# Patient Record
Sex: Female | Born: 1940 | Race: Black or African American | Hispanic: No | Marital: Single | Smoking: Former smoker
Health system: Southern US, Community
[De-identification: ages and names within clinical notes are randomized; demographics above are authoritative.]

## PROBLEM LIST (undated history)

## (undated) DIAGNOSIS — I2510 Atherosclerotic heart disease of native coronary artery without angina pectoris: Secondary | ICD-10-CM

## (undated) DIAGNOSIS — E049 Nontoxic goiter, unspecified: Secondary | ICD-10-CM

## (undated) DIAGNOSIS — E7800 Pure hypercholesterolemia, unspecified: Secondary | ICD-10-CM

## (undated) DIAGNOSIS — J302 Other seasonal allergic rhinitis: Secondary | ICD-10-CM

## (undated) DIAGNOSIS — I219 Acute myocardial infarction, unspecified: Secondary | ICD-10-CM

## (undated) DIAGNOSIS — Z972 Presence of dental prosthetic device (complete) (partial): Secondary | ICD-10-CM

## (undated) DIAGNOSIS — M1990 Unspecified osteoarthritis, unspecified site: Secondary | ICD-10-CM

## (undated) DIAGNOSIS — I10 Essential (primary) hypertension: Secondary | ICD-10-CM

## (undated) DIAGNOSIS — E119 Type 2 diabetes mellitus without complications: Secondary | ICD-10-CM

## (undated) HISTORY — PX: EYE SURGERY: SHX253

---

## 1980-02-10 HISTORY — PX: VAGINAL HYSTERECTOMY: SUR661

## 2003-02-10 DIAGNOSIS — I219 Acute myocardial infarction, unspecified: Secondary | ICD-10-CM

## 2003-02-10 HISTORY — DX: Acute myocardial infarction, unspecified: I21.9

## 2003-02-10 HISTORY — PX: CARDIAC CATHETERIZATION: SHX172

## 2004-01-22 ENCOUNTER — Encounter: Payer: Self-pay | Admitting: Family Medicine

## 2004-02-10 ENCOUNTER — Encounter: Payer: Self-pay | Admitting: Family Medicine

## 2004-03-12 ENCOUNTER — Encounter: Payer: Self-pay | Admitting: Family Medicine

## 2004-04-09 ENCOUNTER — Encounter: Payer: Self-pay | Admitting: Family Medicine

## 2004-09-24 ENCOUNTER — Ambulatory Visit: Payer: Self-pay | Admitting: Family Medicine

## 2005-02-09 LAB — HM COLONOSCOPY: HM Colonoscopy: NORMAL

## 2005-05-04 ENCOUNTER — Ambulatory Visit: Payer: Self-pay | Admitting: Internal Medicine

## 2005-10-06 ENCOUNTER — Ambulatory Visit: Payer: Self-pay | Admitting: Family Medicine

## 2006-02-05 ENCOUNTER — Ambulatory Visit: Payer: Self-pay | Admitting: Gastroenterology

## 2006-02-05 ENCOUNTER — Other Ambulatory Visit: Payer: Self-pay

## 2006-02-05 LAB — HM COLONOSCOPY: HM Colonoscopy: NORMAL

## 2006-10-19 ENCOUNTER — Ambulatory Visit: Payer: Self-pay | Admitting: Family Medicine

## 2006-10-20 ENCOUNTER — Ambulatory Visit: Payer: Self-pay | Admitting: Internal Medicine

## 2007-03-15 ENCOUNTER — Ambulatory Visit: Payer: Self-pay | Admitting: Family Medicine

## 2007-10-20 ENCOUNTER — Ambulatory Visit: Payer: Self-pay | Admitting: Family Medicine

## 2008-01-17 ENCOUNTER — Ambulatory Visit: Payer: Self-pay | Admitting: Family Medicine

## 2008-10-22 ENCOUNTER — Ambulatory Visit: Payer: Self-pay | Admitting: Family Medicine

## 2009-11-13 ENCOUNTER — Ambulatory Visit: Payer: Self-pay | Admitting: Family Medicine

## 2010-03-11 ENCOUNTER — Ambulatory Visit: Payer: Self-pay | Admitting: Family Medicine

## 2010-12-09 ENCOUNTER — Ambulatory Visit: Payer: Self-pay | Admitting: Family Medicine

## 2011-11-12 LAB — HM PAP SMEAR: HM Pap smear: NORMAL

## 2011-12-10 ENCOUNTER — Ambulatory Visit: Payer: Self-pay | Admitting: Family Medicine

## 2011-12-15 ENCOUNTER — Ambulatory Visit: Payer: Self-pay | Admitting: Family Medicine

## 2012-05-10 LAB — HM DEXA SCAN: HM Dexa Scan: NORMAL

## 2012-05-17 ENCOUNTER — Ambulatory Visit: Payer: Self-pay | Admitting: Family Medicine

## 2012-12-15 ENCOUNTER — Ambulatory Visit: Payer: Self-pay | Admitting: Family Medicine

## 2013-01-11 ENCOUNTER — Ambulatory Visit: Payer: Self-pay | Admitting: Family Medicine

## 2013-02-09 LAB — TSH: TSH: 0.42 u[IU]/mL (ref <=5.90)

## 2013-10-23 ENCOUNTER — Ambulatory Visit: Payer: Self-pay | Admitting: Ophthalmology

## 2013-12-12 LAB — LIPID PANEL
Cholesterol: 180 mg/dL (ref 0–200)
HDL: 87 mg/dL — AB (ref 35–70)
LDL Cholesterol: 83 mg/dL
TRIGLYCERIDES: 48 mg/dL (ref 40–160)

## 2014-01-12 ENCOUNTER — Ambulatory Visit: Payer: Self-pay | Admitting: Family Medicine

## 2014-01-12 LAB — HM MAMMOGRAPHY: HM MAMMO: NORMAL

## 2014-01-23 LAB — HEMOGLOBIN A1C: HEMOGLOBIN A1C: 6.7 % — AB (ref 4.0–6.0)

## 2014-01-23 LAB — BASIC METABOLIC PANEL
Creatinine: 0.8 mg/dL (ref <=1.1)
Glucose: 94 mg/dL
POTASSIUM: 3.9 mmol/L (ref 3.4–5.3)

## 2014-06-02 NOTE — Op Note (Signed)
PATIENT NAME:  Vanessa, Zamora MR#:  [id number] DATE OF BIRTH:  01/30/1941  DATE OF PROCEDURE:  10/23/2013  DATE OF SURGERY:  10/23/2013  PREOPERATIVE DIAGNOSIS:  Cataract of the left eye.  POSTOPERATIVE DIAGNOSIS:  Cataract of the left eye.  SURGEON:  Grayland Ormond, MD  PROCEDURE:  Cataract extraction by phacoemulsification with intraocular lens implantation left eye.  ANESTHESIA:  Monitored anesthesia care.  COMPLICATIONS:  None.  HISTORY:  The patient presented to the clinic with complaints of decreased vision in the left eye.  Given disability from glare as well as blur, the patient was presented to the option of cataract surgery, and the risks of the operation were discussed with the patient as well.  After full discussion, the patient expressed an understanding of these risks and now is to undergo the procedure.  SUMMARY OF PROCEDURE:  On the day of surgery, the patient was brought to the preoperative holding area. The left eye was identified and left cheek marked.  The patient was then brought to the operating room where she was connected to an EKG and pulse oximeter and monitored anesthesia care was subsequently initiated.  The right eye was prepped and draped in the usual sterile ophthalmic fashion, and a speculum was placed into the right eye.  Paracentesis was then made into the anterior chamber followed by preservative free lidocaine and Viscoat were instilled into the anterior chamber, thereby filling the anterior chamber of the left eye.  Attention was then directed to the temporal limbus where a bi-planer incision using a microkeratome was then made. A bent needle was used to create a curvilinear capsulorrhexis which was then completed with microforceps in a 360 degree fashion.  Balanced salt solution in a flat cannula was instilled to perform a thorough hydrodissection until the nucleus was noted to spin freely. Primary phacoemulsification emulsification was then initiated in  a divide and conquer format to create 4 grooves and then to subsequently crack the nucleus.  The individual nuclear pieces were phacoemulsified using a secondary phacoemulsification technique.  The remaining cortical material then was removed with an irrigation aspiration handpiece.  Healon was then injected into the capsular bag in the surgical space and an AcrySof IOL was loaded into the injector and injected into the capsular bag.  The remaining Healon was removed with irrigation and aspiration.  The corneal wounds were then strongly hydrated and found to be watertight.  After this, about 1 mL of cefuroxime was instilled into the anterior chamber and the speculum was removed.  One drop of brimonidine and one drop of timolol was applied to the operated eye and eye shields were placed.  The patient was then taken to the postoperative recovery area.  Instructions were given to avoid heavy lifting, eye rubbing, as well as bending over.  The patient will be returning to me in the next day in the eye clinic for a postoperative check.  The patient stated an understanding of these instructions.     ____________________________ Grayland Ormond, MD apv:nr D: 10/26/2013 21:45:00 ET T: 10/26/2013 21:55:18 ET JOB#: [id number]  cc: Grayland Ormond, MD, <Dictator> Xylina Rhoads P VIN [facility] MD ELECTRONICALLY SIGNED 11/01/2013 13:44

## 2014-06-07 DIAGNOSIS — Z1331 Encounter for screening for depression: Secondary | ICD-10-CM | POA: Insufficient documentation

## 2014-06-07 DIAGNOSIS — E0590 Thyrotoxicosis, unspecified without thyrotoxic crisis or storm: Secondary | ICD-10-CM | POA: Insufficient documentation

## 2014-06-07 DIAGNOSIS — E785 Hyperlipidemia, unspecified: Secondary | ICD-10-CM | POA: Insufficient documentation

## 2014-06-07 DIAGNOSIS — E119 Type 2 diabetes mellitus without complications: Secondary | ICD-10-CM | POA: Insufficient documentation

## 2014-06-07 DIAGNOSIS — E7800 Pure hypercholesterolemia, unspecified: Secondary | ICD-10-CM | POA: Insufficient documentation

## 2014-06-07 DIAGNOSIS — I10 Essential (primary) hypertension: Secondary | ICD-10-CM | POA: Insufficient documentation

## 2014-06-07 DIAGNOSIS — Z0000 Encounter for general adult medical examination without abnormal findings: Secondary | ICD-10-CM | POA: Insufficient documentation

## 2014-08-16 ENCOUNTER — Other Ambulatory Visit: Payer: Self-pay | Admitting: Family Medicine

## 2014-08-16 DIAGNOSIS — I10 Essential (primary) hypertension: Secondary | ICD-10-CM

## 2014-08-17 ENCOUNTER — Other Ambulatory Visit: Payer: Self-pay | Admitting: Family Medicine

## 2014-08-17 DIAGNOSIS — I10 Essential (primary) hypertension: Secondary | ICD-10-CM

## 2014-10-11 HISTORY — PX: CATARACT EXTRACTION: SUR2

## 2014-10-16 ENCOUNTER — Other Ambulatory Visit: Payer: Self-pay | Admitting: Family Medicine

## 2014-10-16 DIAGNOSIS — I10 Essential (primary) hypertension: Secondary | ICD-10-CM

## 2014-11-13 ENCOUNTER — Encounter: Payer: Self-pay | Admitting: *Deleted

## 2014-11-16 NOTE — Discharge Instructions (Signed)

## 2014-11-19 ENCOUNTER — Encounter
Admission: RE | Disposition: A | Discharge status: Home / Self Care | Payer: Self-pay | Source: Ambulatory Visit | Attending: Ophthalmology

## 2014-11-19 ENCOUNTER — Ambulatory Visit: Payer: Medicare Other | Admitting: Anesthesiology

## 2014-11-19 ENCOUNTER — Ambulatory Visit
Admission: RE | Admit: 2014-11-19 | Discharge: 2014-11-19 | Disposition: A | Discharge status: Home / Self Care | Payer: Medicare Other | Source: Ambulatory Visit | Attending: Ophthalmology | Admitting: Ophthalmology

## 2014-11-19 DIAGNOSIS — E7800 Pure hypercholesterolemia, unspecified: Secondary | ICD-10-CM | POA: Diagnosis not present

## 2014-11-19 DIAGNOSIS — I2510 Atherosclerotic heart disease of native coronary artery without angina pectoris: Secondary | ICD-10-CM | POA: Diagnosis not present

## 2014-11-19 DIAGNOSIS — I10 Essential (primary) hypertension: Secondary | ICD-10-CM | POA: Diagnosis not present

## 2014-11-19 DIAGNOSIS — Z888 Allergy status to other drugs, medicaments and biological substances status: Secondary | ICD-10-CM | POA: Insufficient documentation

## 2014-11-19 DIAGNOSIS — E119 Type 2 diabetes mellitus without complications: Secondary | ICD-10-CM | POA: Insufficient documentation

## 2014-11-19 DIAGNOSIS — Z90710 Acquired absence of both cervix and uterus: Secondary | ICD-10-CM | POA: Insufficient documentation

## 2014-11-19 DIAGNOSIS — Z87891 Personal history of nicotine dependence: Secondary | ICD-10-CM | POA: Insufficient documentation

## 2014-11-19 DIAGNOSIS — H2511 Age-related nuclear cataract, right eye: Secondary | ICD-10-CM | POA: Insufficient documentation

## 2014-11-19 HISTORY — DX: Acute myocardial infarction, unspecified: I21.9

## 2014-11-19 HISTORY — DX: Pure hypercholesterolemia, unspecified: E78.00

## 2014-11-19 HISTORY — DX: Essential (primary) hypertension: I10

## 2014-11-19 HISTORY — DX: Other seasonal allergic rhinitis: J30.2

## 2014-11-19 HISTORY — DX: Atherosclerotic heart disease of native coronary artery without angina pectoris: I25.10

## 2014-11-19 HISTORY — DX: Unspecified osteoarthritis, unspecified site: M19.90

## 2014-11-19 HISTORY — PX: CATARACT EXTRACTION W/PHACO: SHX586

## 2014-11-19 HISTORY — DX: Nontoxic goiter, unspecified: E04.9

## 2014-11-19 HISTORY — DX: Presence of dental prosthetic device (complete) (partial): Z97.2

## 2014-11-19 HISTORY — DX: Type 2 diabetes mellitus without complications: E11.9

## 2014-11-19 LAB — GLUCOSE, CAPILLARY
GLUCOSE-CAPILLARY: 94 mg/dL (ref 65–99)
GLUCOSE-CAPILLARY: 99 mg/dL (ref 65–99)

## 2014-11-19 SURGERY — PHACOEMULSIFICATION, CATARACT, WITH IOL INSERTION
Anesthesia: Monitor Anesthesia Care | Laterality: Right | Wound class: Clean

## 2014-11-19 MED ORDER — TIMOLOL MALEATE 0.5 % OP SOLN
OPHTHALMIC | Status: DC | PRN
  Administered 2014-11-19: 1 [drp] via OPHTHALMIC

## 2014-11-19 MED ORDER — TETRACAINE HCL 0.5 % OP SOLN
1.0000 [drp] | OPHTHALMIC | Status: DC | PRN
  Administered 2014-11-19: 1 [drp] via OPHTHALMIC

## 2014-11-19 MED ORDER — BRIMONIDINE TARTRATE 0.2 % OP SOLN
OPHTHALMIC | Status: DC | PRN
  Administered 2014-11-19: 1 [drp] via OPHTHALMIC

## 2014-11-19 MED ORDER — POVIDONE-IODINE 5 % OP SOLN
1.0000 "application " | OPHTHALMIC | Status: DC | PRN
  Administered 2014-11-19: 1 via OPHTHALMIC

## 2014-11-19 MED ORDER — LIDOCAINE HCL (PF) 4 % IJ SOLN
INTRAOCULAR | Status: DC | PRN
  Administered 2014-11-19: 1 mL via OPHTHALMIC

## 2014-11-19 MED ORDER — LACTATED RINGERS IV SOLN: INTRAVENOUS | Status: DC

## 2014-11-19 MED ORDER — ARMC OPHTHALMIC DILATING GEL
1.0000 "application " | OPHTHALMIC | Status: DC | PRN
  Administered 2014-11-19 (×2): 1 via OPHTHALMIC

## 2014-11-19 MED ORDER — CEFUROXIME OPHTHALMIC INJECTION 1 MG/0.1 ML
INJECTION | OPHTHALMIC | Status: DC | PRN
  Administered 2014-11-19: 0.1 mL via INTRACAMERAL

## 2014-11-19 MED ORDER — MIDAZOLAM HCL 2 MG/2ML IJ SOLN
INTRAMUSCULAR | Status: DC | PRN
  Administered 2014-11-19: 1.5 mg via INTRAVENOUS

## 2014-11-19 MED ORDER — FENTANYL CITRATE (PF) 100 MCG/2ML IJ SOLN
INTRAMUSCULAR | Status: DC | PRN
  Administered 2014-11-19: 50 ug via INTRAVENOUS

## 2014-11-19 MED ORDER — NA HYALUR & NA CHOND-NA HYALUR 0.4-0.35 ML IO KIT
PACK | INTRAOCULAR | Status: DC | PRN
  Administered 2014-11-19: 1 mL via INTRAOCULAR

## 2014-11-19 MED ORDER — EPINEPHRINE HCL 1 MG/ML IJ SOLN
INTRAOCULAR | Status: DC | PRN
  Administered 2014-11-19: 95 mL via OPHTHALMIC

## 2014-11-19 SURGICAL SUPPLY — 29 items
APPLICATOR COTTON TIP 3IN (MISCELLANEOUS) ×2 IMPLANT
CANNULA ANT/CHMB 27GA (MISCELLANEOUS) ×2 IMPLANT
DISSECTOR HYDRO NUCLEUS 50X22 (MISCELLANEOUS) ×2 IMPLANT
GLOVE BIO SURGEON STRL SZ7 (GLOVE) ×2 IMPLANT
GLOVE SURG LX 6.5 MICRO (GLOVE) ×1
GLOVE SURG LX STRL 6.5 MICRO (GLOVE) ×1 IMPLANT
GOWN STRL REUS W/ TWL LRG LVL3 (GOWN DISPOSABLE) ×2 IMPLANT
GOWN STRL REUS W/TWL LRG LVL3 (GOWN DISPOSABLE) ×2
LENS IOL ACRSF IQ PC 22.5 (Intraocular Lens) ×1 IMPLANT
LENS IOL ACRYSOF IQ POST 22.5 (Intraocular Lens) ×2 IMPLANT
MARKER SKIN SURG W/RULER VIO (MISCELLANEOUS) ×2 IMPLANT
NEEDLE FILTER BLUNT 18X 1/2SAF (NEEDLE) ×2
NEEDLE FILTER BLUNT 18X1 1/2 (NEEDLE) ×2 IMPLANT
PACK CATARACT BRASINGTON (MISCELLANEOUS) ×2 IMPLANT
PACK EYE AFTER SURG (MISCELLANEOUS) ×2 IMPLANT
PACK OPTHALMIC (MISCELLANEOUS) ×2 IMPLANT
RING MALYGIN 7.0 (MISCELLANEOUS) IMPLANT
SOL BAL SALT 15ML (MISCELLANEOUS)
SOLUTION BAL SALT 15ML (MISCELLANEOUS) IMPLANT
SUT ETHILON 10-0 CS-B-6CS-B-6 (SUTURE)
SUT VICRYL  9 0 (SUTURE)
SUT VICRYL 9 0 (SUTURE) IMPLANT
SUTURE EHLN 10-0 CS-B-6CS-B-6 (SUTURE) IMPLANT
SYR 3ML LL SCALE MARK (SYRINGE) ×4 IMPLANT
SYR TB 1ML LUER SLIP (SYRINGE) ×2 IMPLANT
WATER STERILE IRR 250ML POUR (IV SOLUTION) ×2 IMPLANT
WATER STERILE IRR 500ML POUR (IV SOLUTION) IMPLANT
WICK EYE OCUCEL (MISCELLANEOUS) IMPLANT
WIPE NON LINTING 3.25X3.25 (MISCELLANEOUS) ×2 IMPLANT

## 2014-11-19 NOTE — H&P (Signed)
H+P reviewed and is up to date, please see paper chart.  

## 2014-11-19 NOTE — Transfer of Care (Signed)
Immediate Anesthesia Transfer of Care Note  Patient: Vanessa Zamora  Procedure(s) Performed: Procedure(s) with comments: CATARACT EXTRACTION PHACO AND INTRAOCULAR LENS PLACEMENT (IOC) (Right) - DIABETIC - oral meds  Patient Location: PACU  Anesthesia Type: MAC  Level of Consciousness: awake, alert  and patient cooperative  Airway and Oxygen Therapy: Patient Spontanous Breathing and Patient connected to supplemental oxygen  Post-op Assessment: Post-op Vital signs reviewed, Patient's Cardiovascular Status Stable, Respiratory Function Stable, Patent Airway and No signs of Nausea or vomiting  Post-op Vital Signs: Reviewed and stable  Complications: No apparent anesthesia complications

## 2014-11-19 NOTE — Op Note (Signed)
Date of Surgery: 11/19/2014  PREOPERATIVE DIAGNOSES: Visually significant nuclear sclerotic cataract, right eye.  POSTOPERATIVE DIAGNOSES: Same  PROCEDURES PERFORMED: Cataract extraction with intraocular lens implant, right eye.  SURGEON: Almon Hercules, M.D.  ANESTHESIA: MAC and topical  IMPLANTS: AcrySof IQ [id number] +22.5   Implant Name Type Inv. Item Serial No. Manufacturer Lot No. LRB No. Used  IMPLANT LENS - [id number] Intraocular Lens IMPLANT LENS [id number] ALCON   Right 1     COMPLICATIONS: None.  DESCRIPTION OF PROCEDURE: Therapeutic options were discussed with the patient preoperatively, including a discussion of risks and benefits of surgery. Informed consent was obtained. An IOL-Master and immersion biometry were used to take the lens measurements, and a dilated fundus exam was performed within 6 months of the surgical date.  The patient was premedicated and brought to the operating room and placed on the operating table in the supine position. After adequate anesthesia, the patient was prepped and draped in the usual sterile ophthalmic fashion. A wire lid speculum was inserted and the microscope was positioned. A Superblade was used to create a paracentesis site at the limbus and a small amount of dilute preservative free lidocaine was instilled into the anterior chamber, followed by dispersive viscoelastic. A clear corneal incision was created temporally using a 2.4 mm keratome blade. Capsulorrhexis was then performed. In situ phacoemulsification was performed.  Cortical material was removed with the irrigation-aspiration unit. Dispersive viscoelastic was instilled to open the capsular bag. A posterior chamber intraocular lens with the specifications above was inserted and positioned. Irrigation-aspiration was used to remove all viscoelastic. Cefuroxime 1cc was instilled into the anterior chamber, and the corneal incision was checked and found to be water tight. The eyelid  speculum was removed.  The operative eye was covered with protective goggles after instilling 1 drop of timolol and brimonidine. The patient tolerated the procedure well. There were no complications.

## 2014-11-19 NOTE — Anesthesia Postprocedure Evaluation (Signed)
  Anesthesia Post-op Note  Patient: Vanessa Zamora  Procedure(s) Performed: Procedure(s) with comments: CATARACT EXTRACTION PHACO AND INTRAOCULAR LENS PLACEMENT (IOC) (Right) - DIABETIC - oral meds  Anesthesia type:MAC  Patient location: PACU  Post pain: Pain level controlled  Post assessment: Post-op Vital signs reviewed, Patient's Cardiovascular Status Stable, Respiratory Function Stable, Patent Airway and No signs of Nausea or vomiting  Post vital signs: Reviewed and stable  Last Vitals:  Filed Vitals:   11/19/14 0924  BP: 132/79  Pulse: 52  Temp:   Resp: 16    Level of consciousness: awake, alert  and patient cooperative  Complications: No apparent anesthesia complications

## 2014-11-19 NOTE — Anesthesia Procedure Notes (Signed)
Procedure Name: MAC Performed by: Kendyl Festa Pre-anesthesia Checklist: Patient identified, Emergency Drugs available, Suction available, Timeout performed and Patient being monitored Patient Re-evaluated:Patient Re-evaluated prior to inductionOxygen Delivery Method: Nasal cannula Placement Confirmation: positive ETCO2       

## 2014-11-19 NOTE — Anesthesia Preprocedure Evaluation (Signed)
Anesthesia Evaluation  Patient identified by MRN, date of birth, ID band  Reviewed: NPO status   History of Anesthesia Complications Negative for: history of anesthetic complications  Airway Mallampati: II  TM Distance: >3 FB Neck ROM: full    Dental  (+) Partial Upper, Partial Lower   Pulmonary neg pulmonary ROS, former smoker,    Pulmonary exam normal        Cardiovascular Exercise Tolerance: Good hypertension, + CAD and + Past MI (2005)  Normal cardiovascular exam  Cardiomyopathy    Neuro/Psych negative neurological ROS  negative psych ROS   GI/Hepatic negative GI ROS, Neg liver ROS,   Endo/Other  negative endocrine ROSdiabetesThyroid goiter  Renal/GU negative Renal ROS  negative genitourinary   Musculoskeletal   Abdominal   Peds  Hematology negative hematology ROS (+)   Anesthesia Other Findings stress echo: nov 2015: LVEF= 45-50 %; Regional wall motion:  demonstrates  hypokinesis of the Anterior apical walls. The overall quality of the study is good.  Artifacts noted: noLeft ventricular cavity: enlarged. Perfusion Analysis:  SPECT images demonstrate large perfusion abnormality of moderate intensity is present in the  anterior apical  region on the stress images.   No reversible defect.  echo: nov 3845: MILD LV SYSTOLIC DYSFUNCTION; NO VALVULAR STENOSIS; MILD VALVULAR REGURGITATION; Mild PHTN; Normal overall left ventricular function ejection fraction between 50 and 55%;  card stable: 07/2014: history of myocardial infarction. She only has moderate coronary disease has been treated medically ; coronary artery disease stable persistent continue current medical therapy including aspirin;    Reproductive/Obstetrics                             Anesthesia Physical Anesthesia Plan  ASA: III  Anesthesia Plan: MAC   Post-op Pain Management:    Induction:   Airway Management  Planned:   Additional Equipment:   Intra-op Plan:   Post-operative Plan:   Informed Consent: I have reviewed the patients History and Physical, chart, labs and discussed the procedure including the risks, benefits and alternatives for the proposed anesthesia with the patient or authorized representative who has indicated his/her understanding and acceptance.     Plan Discussed with: CRNA  Anesthesia Plan Comments:         Anesthesia Quick Evaluation

## 2014-11-20 ENCOUNTER — Encounter: Payer: Self-pay | Admitting: Ophthalmology

## 2014-12-06 ENCOUNTER — Ambulatory Visit (INDEPENDENT_AMBULATORY_CARE_PROVIDER_SITE_OTHER): Payer: Medicare Other | Admitting: Family Medicine

## 2014-12-06 ENCOUNTER — Encounter: Payer: Self-pay | Admitting: Family Medicine

## 2014-12-06 VITALS — BP 110/62 | HR 60 | Ht 65.0 in | Wt 135.0 lb

## 2014-12-06 DIAGNOSIS — Z23 Encounter for immunization: Secondary | ICD-10-CM

## 2014-12-06 DIAGNOSIS — E119 Type 2 diabetes mellitus without complications: Secondary | ICD-10-CM | POA: Diagnosis not present

## 2014-12-06 DIAGNOSIS — E785 Hyperlipidemia, unspecified: Secondary | ICD-10-CM

## 2014-12-06 DIAGNOSIS — I10 Essential (primary) hypertension: Secondary | ICD-10-CM

## 2014-12-06 DIAGNOSIS — H6121 Impacted cerumen, right ear: Secondary | ICD-10-CM

## 2014-12-06 MED ORDER — HYDROCHLOROTHIAZIDE 25 MG PO TABS
25.0000 mg | ORAL_TABLET | Freq: Every day | ORAL | Status: DC
Start: 2014-12-06 — End: 2015-04-09

## 2014-12-06 MED ORDER — PRAVASTATIN SODIUM 40 MG PO TABS: 40.0000 mg | ORAL_TABLET | Freq: Every day | ORAL | Status: DC

## 2014-12-06 MED ORDER — CARVEDILOL 6.25 MG PO TABS: 6.2500 mg | ORAL_TABLET | Freq: Two times a day (BID) | ORAL | Status: DC

## 2014-12-06 MED ORDER — OLMESARTAN MEDOXOMIL 40 MG PO TABS: 40.0000 mg | ORAL_TABLET | Freq: Every day | ORAL | Status: DC

## 2014-12-06 MED ORDER — METFORMIN HCL 500 MG PO TABS: 500.0000 mg | ORAL_TABLET | Freq: Two times a day (BID) | ORAL | Status: DC

## 2014-12-06 NOTE — Progress Notes (Signed)
Name: Vanessa Zamora   MRN: [id number]    DOB: December 23, 1940   Date:12/06/2014       Progress Note  Subjective  Chief Complaint  Chief Complaint  Patient presents with  . Diabetes  . Hypertension  . Hyperlipidemia    Diabetes She presents for her follow-up diabetic visit. She has type 2 diabetes mellitus. Her disease course has been stable. Pertinent negatives for hypoglycemia include no confusion, dizziness, headaches, hunger, mood changes, nervousness/anxiousness, pallor, seizures, sleepiness, speech difficulty, sweats or tremors. Pertinent negatives for diabetes include no blurred vision, no chest pain, no fatigue, no foot paresthesias, no foot ulcerations, no polydipsia, no polyphagia, no polyuria, no visual change, no weakness and no weight loss. There are no hypoglycemic complications. Pertinent negatives for hypoglycemia complications include no blackouts and no nocturnal hypoglycemia. Symptoms are stable. There are no diabetic complications. Pertinent negatives for diabetic complications include no CVA, PVD or retinopathy. Risk factors for coronary artery disease include dyslipidemia, diabetes mellitus and hypertension. Current diabetic treatment includes oral agent (monotherapy). She is compliant with treatment all of the time. She is following a generally healthy diet. She has not had a previous visit with a dietitian. Her breakfast blood glucose is taken between 8-9 am. Her breakfast blood glucose range is generally 110-130 mg/dl. An ACE inhibitor/angiotensin II receptor blocker is being taken. She sees a podiatrist.Eye exam is current.  Hypertension This is a chronic problem. The current episode started more than 1 year ago. The problem has been waxing and waning since onset. The problem is controlled. Pertinent negatives include no anxiety, blurred vision, chest pain, headaches, malaise/fatigue, neck pain, orthopnea, palpitations, peripheral edema, PND, shortness of breath or sweats.  There are no associated agents to hypertension. There are no known risk factors for coronary artery disease. Past treatments include angiotensin blockers, diuretics and beta blockers. The current treatment provides mild improvement. There are no compliance problems.  There is no history of angina, kidney disease, CAD/MI, CVA, heart failure, left ventricular hypertrophy, PVD, renovascular disease or retinopathy. There is no history of chronic renal disease.  Hyperlipidemia This is a chronic problem. The current episode started more than 1 year ago. The problem is controlled. Recent lipid tests were reviewed and are normal. She has no history of chronic renal disease, diabetes, hypothyroidism, liver disease, obesity or nephrotic syndrome. There are no known factors aggravating her hyperlipidemia. Pertinent negatives include no chest pain, focal weakness, myalgias or shortness of breath. Current antihyperlipidemic treatment includes statins (otc omea 3). The current treatment provides moderate improvement of lipids. There are no compliance problems.     No problem-specific assessment & plan notes found for this encounter.   Past Medical History  Diagnosis Date  . Myocardial infarction ([place]) 2005  . Hypertension   . Coronary artery disease   . Diabetes mellitus without complication ([place])   . Hypercholesteremia   . Seasonal allergies   . Thyroid goiter   . Arthritis     shoulders, knees  . Wears dentures     partial lower    Past Surgical History  Procedure Laterality Date  . Vaginal hysterectomy  02/10/1980  . Cardiac catheterization  2005  . Cataract extraction Left 10/2014  . Cataract extraction w/phaco Right 11/19/2014    Procedure: CATARACT EXTRACTION PHACO AND INTRAOCULAR LENS PLACEMENT (IOC);  Surgeon: Ronnell Freshwater, MD;  Location: [facility];  Service: Ophthalmology;  Laterality: Right;  DIABETIC - oral meds    History reviewed. No pertinent family  history.  Social History   Social History  . Marital Status: Single    Spouse Name: N/A  . Number of Children: N/A  . Years of Education: N/A   Occupational History  . Not on file.   Social History Main Topics  . Smoking status: Former Research scientist (life sciences)  . Smokeless tobacco: Not on file     Comment: quit 2003  . Alcohol Use: No  . Drug Use: No  . Sexual Activity: Not Currently   Other Topics Concern  . Not on file   Social History Narrative    Allergies  Allergen Reactions  . Altace  [Ramipril] Anaphylaxis  . Ace Inhibitors Swelling  . Clonidine Swelling  . Losartan Potassium Other (See Comments)     Review of Systems  Constitutional: Negative for fever, chills, weight loss, malaise/fatigue and fatigue.  HENT: Negative for ear discharge, ear pain and sore throat.   Eyes: Negative for blurred vision.  Respiratory: Negative for cough, sputum production, shortness of breath and wheezing.   Cardiovascular: Negative for chest pain, palpitations, orthopnea, leg swelling and PND.  Gastrointestinal: Negative for heartburn, nausea, abdominal pain, diarrhea, constipation, blood in stool and melena.  Genitourinary: Negative for dysuria, urgency, frequency and hematuria.  Musculoskeletal: Negative for myalgias, back pain, joint pain and neck pain.  Skin: Negative for pallor and rash.  Neurological: Negative for dizziness, tingling, tremors, sensory change, focal weakness, seizures, speech difficulty, weakness and headaches.  Endo/Heme/Allergies: Negative for environmental allergies, polydipsia and polyphagia. Does not bruise/bleed easily.  Psychiatric/Behavioral: Negative for depression, suicidal ideas and confusion. The patient is not nervous/anxious and does not have insomnia.      Objective  Filed Vitals:   12/06/14 0816  BP: 110/62  Pulse: 60  Height: 5\' 5"  (1.651 m)  Weight: 135 lb (61.236 kg)    Physical Exam  Constitutional: She is well-developed, well-nourished, and in  no distress. No distress.  HENT:  Head: Normocephalic and atraumatic.  Right Ear: Hearing and external ear normal. A foreign body is present.  Left Ear: Hearing, external ear and ear canal normal.  Nose: Nose normal.  Mouth/Throat: Oropharynx is clear and moist.  Cerumen impaction right  Eyes: Conjunctivae and EOM are normal. Pupils are equal, round, and reactive to light. Right eye exhibits no discharge. Left eye exhibits no discharge.  Neck: Normal range of motion. Neck supple. No JVD present. No thyromegaly present.  Cardiovascular: Normal rate, regular rhythm, normal heart sounds and intact distal pulses.  Exam reveals no gallop and no friction rub.   No murmur heard. Pulmonary/Chest: Effort normal and breath sounds normal.  Abdominal: Soft. Bowel sounds are normal. She exhibits no mass. There is no tenderness. There is no guarding.  Musculoskeletal: Normal range of motion. She exhibits no edema.  Lymphadenopathy:    She has no cervical adenopathy.  Neurological: She is alert. She has normal reflexes.  Skin: Skin is warm and dry. She is not diaphoretic.  Psychiatric: Mood and affect normal.      Assessment & Plan  Problem List Items Addressed This Visit      Cardiovascular and Mediastinum   Essential (primary) hypertension - Primary   Relevant Medications   carvedilol (COREG) 6.25 MG tablet   hydrochlorothiazide (HYDRODIURIL) 25 MG tablet   olmesartan (BENICAR) 40 MG tablet   pravastatin (PRAVACHOL) 40 MG tablet   Other Relevant Orders   Renal Function Panel     Endocrine   Diabetes mellitus with no complication (HCC)   Relevant Medications  metFORMIN (GLUCOPHAGE) 500 MG tablet   olmesartan (BENICAR) 40 MG tablet   pravastatin (PRAVACHOL) 40 MG tablet   Other Relevant Orders   HgB A1c     Other   Dyslipidemia   Relevant Medications   pravastatin (PRAVACHOL) 40 MG tablet   Other Relevant Orders   Lipid Profile    Other Visit Diagnoses    Essential  hypertension        Relevant Medications    carvedilol (COREG) 6.25 MG tablet    hydrochlorothiazide (HYDRODIURIL) 25 MG tablet    olmesartan (BENICAR) 40 MG tablet    pravastatin (PRAVACHOL) 40 MG tablet    Cerumen impaction, right        Need for influenza vaccination        Relevant Orders    Flu Vaccine QUAD 36+ mos PF IM (Fluarix & Fluzone Quad PF) (Completed)         Dr. Jawuan Robb [facility]  12/06/2014

## 2014-12-07 LAB — RENAL FUNCTION PANEL
Albumin: 4.5 g/dL (ref 3.5–4.8)
BUN / CREAT RATIO: 28 — AB (ref 11–26)
BUN: 21 mg/dL (ref 8–27)
CHLORIDE: 99 mmol/L (ref 97–106)
CO2: 25 mmol/L (ref 18–29)
CREATININE: 0.75 mg/dL (ref 0.57–1.00)
Calcium: 9.5 mg/dL (ref 8.7–10.3)
GFR calc Af Amer: 90 mL/min/{1.73_m2} (ref >59)
GFR calc non Af Amer: 78 mL/min/{1.73_m2} (ref >59)
Glucose: 96 mg/dL (ref 65–99)
Phosphorus: 3.6 mg/dL (ref 2.5–4.5)
Potassium: 4.6 mmol/L (ref 3.5–5.2)
Sodium: 142 mmol/L (ref 136–144)

## 2014-12-07 LAB — LIPID PANEL
CHOLESTEROL TOTAL: 174 mg/dL (ref 100–199)
Chol/HDL Ratio: 2 ratio units (ref 0.0–4.4)
HDL: 87 mg/dL (ref >39)
LDL Calculated: 76 mg/dL (ref 0–99)
TRIGLYCERIDES: 57 mg/dL (ref 0–149)
VLDL CHOLESTEROL CAL: 11 mg/dL (ref 5–40)

## 2014-12-07 LAB — HEMOGLOBIN A1C
ESTIMATED AVERAGE GLUCOSE: 143 mg/dL
Hgb A1c MFr Bld: 6.6 % — ABNORMAL HIGH (ref 4.8–5.6)

## 2014-12-17 ENCOUNTER — Encounter: Payer: Medicare Other | Admitting: Family Medicine

## 2014-12-18 ENCOUNTER — Ambulatory Visit (INDEPENDENT_AMBULATORY_CARE_PROVIDER_SITE_OTHER): Payer: Medicare Other | Admitting: Family Medicine

## 2014-12-18 ENCOUNTER — Encounter: Payer: Self-pay | Admitting: Family Medicine

## 2014-12-18 VITALS — BP 120/70 | HR 70 | Ht 65.0 in | Wt 141.0 lb

## 2014-12-18 DIAGNOSIS — Z23 Encounter for immunization: Secondary | ICD-10-CM

## 2014-12-18 DIAGNOSIS — Z0000 Encounter for general adult medical examination without abnormal findings: Secondary | ICD-10-CM

## 2014-12-18 DIAGNOSIS — Z1239 Encounter for other screening for malignant neoplasm of breast: Secondary | ICD-10-CM

## 2014-12-18 NOTE — Progress Notes (Signed)
Patient: Vanessa Zamora, Female    DOB: 1940/04/10, 74 y.o.   MRN: [id number] Visit Date: 12/18/2014  Today's Provider: Otilio Miu, MD   Chief Complaint  Patient presents with  . Annual Exam    MAW   Subjective:   Initial preventative physical exam Vanessa Zamora is a 74 y.o. female who presents today for her Initial Preventative Physical Exam. She feels well. She reports exercising intermitantly. She reports she is sleeping well.  HPI Comments: Patient for annual wellness visit with no subjective/objective concerns.   Review of Systems  Constitutional: Negative for fever, chills, fatigue and unexpected weight change.  HENT: Negative for ear pain, hearing loss, nosebleeds, sneezing, sore throat and trouble swallowing.   Eyes: Negative for photophobia, pain, redness, itching and visual disturbance.  Respiratory: Negative for cough, chest tightness, shortness of breath and wheezing.   Cardiovascular: Negative for chest pain, palpitations and leg swelling.  Gastrointestinal: Negative for nausea, vomiting, abdominal pain, diarrhea, constipation, blood in stool and rectal pain.  Endocrine: Negative for cold intolerance, heat intolerance, polydipsia, polyphagia and polyuria.  Genitourinary: Negative for dysuria, hematuria, flank pain, vaginal bleeding, vaginal discharge, difficulty urinating, menstrual problem and pelvic pain.  Musculoskeletal: Negative for back pain, joint swelling, neck pain and neck stiffness.  Skin: Negative for color change and rash.  Allergic/Immunologic: Negative for immunocompromised state.  Neurological: Negative for dizziness, tremors, seizures, syncope, facial asymmetry, speech difficulty, weakness, light-headedness, numbness and headaches.  Hematological: Does not bruise/bleed easily.  Psychiatric/Behavioral: Negative for suicidal ideas, hallucinations, behavioral problems, confusion, sleep disturbance, self-injury and agitation. The patient is not  nervous/anxious.     Social History   Social History  . Marital Status: Single    Spouse Name: N/A  . Number of Children: N/A  . Years of Education: N/A   Occupational History  . Not on file.   Social History Main Topics  . Smoking status: Former Research scientist (life sciences)  . Smokeless tobacco: Not on file     Comment: quit 2003  . Alcohol Use: No  . Drug Use: No  . Sexual Activity: Not Currently   Other Topics Concern  . Not on file   Social History Narrative    Patient Active Problem List   Diagnosis Date Noted  . Diabetes mellitus with no complication ([place]) 51/70/0174  . Hypercholesteremia 06/07/2014  . Dyslipidemia 06/07/2014  . Essential (primary) hypertension 06/07/2014  . Hyperthyroidism 06/07/2014  . Routine general medical examination at a health care facility 06/07/2014  . Screening for depression 06/07/2014    Past Surgical History  Procedure Laterality Date  . Vaginal hysterectomy  02/10/1980  . Cardiac catheterization  2005  . Cataract extraction Left 10/2014  . Cataract extraction w/phaco Right 11/19/2014    Procedure: CATARACT EXTRACTION PHACO AND INTRAOCULAR LENS PLACEMENT (IOC);  Surgeon: Ronnell Freshwater, MD;  Location: [facility];  Service: Ophthalmology;  Laterality: Right;  DIABETIC - oral meds    Her family history is not on file.    Previous Medications   ASPIRIN 81 MG TABLET    Take 1 tablet by mouth daily.   CALCIUM CARBONATE-VIT D-MIN (CALTRATE 600+D PLUS MINERALS) 600-800 MG-UNIT TABS    Take 1 tablet by mouth daily.   CARVEDILOL (COREG) 6.25 MG TABLET    Take 1 tablet (6.25 mg total) by mouth 2 (two) times daily.   DUREZOL 0.05 % EMUL    Place 1 drop into both eyes 2 (two) times daily.   HYDROCHLOROTHIAZIDE (HYDRODIURIL) 25 MG TABLET  Take 1 tablet (25 mg total) by mouth daily.   ILEVRO 0.3 % OPHTHALMIC SUSPENSION    Place 1 drop into both eyes at bedtime.   METFORMIN (GLUCOPHAGE) 500 MG TABLET    Take 1 tablet (500 mg total) by mouth  2 (two) times daily.   MULTIPLE VITAMIN (MULTI-VITAMINS) TABS    Take 1 tablet by mouth daily.   MULTIPLE VITAMINS-MINERALS (CENTRUM SILVER ULTRA WOMENS) TABS    Take 1 tablet by mouth daily.   OLMESARTAN (BENICAR) 40 MG TABLET    Take 1 tablet (40 mg total) by mouth daily. AM   OMEGA-3 FATTY ACIDS (FISH OIL) 1000 MG CAPS    Take 1 capsule by mouth daily.   PRAVASTATIN (PRAVACHOL) 40 MG TABLET    Take 1 tablet (40 mg total) by mouth daily.    Patient Care Team: Juline Patch, MD as PCP - General (Family Medicine)     Objective:   Vitals: BP 120/70 mmHg  Pulse 70  Ht 5\' 5"  (1.651 m)  Wt 141 lb (63.957 kg)  BMI 23.46 kg/m2  Physical Exam  Constitutional: She is oriented to person, place, and time. She appears well-developed and well-nourished.  HENT:  Head: Normocephalic.  Right Ear: External ear normal.  Left Ear: External ear normal.  Nose: Nose normal.  Eyes: Conjunctivae and EOM are normal. Pupils are equal, round, and reactive to light.  Neck: Normal range of motion. Neck supple.  Cardiovascular: Normal rate, regular rhythm, normal heart sounds and intact distal pulses.   Pulmonary/Chest: Effort normal and breath sounds normal. Right breast exhibits no inverted nipple, no mass, no nipple discharge, no skin change and no tenderness. Left breast exhibits no inverted nipple, no mass, no nipple discharge, no skin change and no tenderness. Breasts are symmetrical.  Abdominal: Soft. Bowel sounds are normal.  Genitourinary: Vagina normal and uterus normal.  Musculoskeletal: Normal range of motion.  Neurological: She is alert and oriented to person, place, and time. She has normal reflexes.  Skin: Skin is warm and dry.  Psychiatric: She has a normal mood and affect. Her behavior is normal. Judgment and thought content normal.     No exam data present  Activities of Daily Living In your present state of health, do you have any difficulty performing the following activities:  12/18/2014 12/06/2014  Hearing? N N  Vision? N N  Difficulty concentrating or making decisions? N N  Walking or climbing stairs? N N  Dressing or bathing? N N  Doing errands, shopping? N N    Fall Risk Assessment Fall Risk  12/18/2014 12/06/2014  Falls in the past year? No No     Patient reports there are not safety devices in place in shower at home. Smoke and CO monitors functional.  Depression Screen PHQ 2/9 Scores 12/18/2014 12/06/2014  PHQ - 2 Score 0 0    Cognitive Testing - 6-CIT   Correct? Score   What year is it? yes 0 Yes = 0    No = 4  What month is it? yes 0 Yes = 0    No = 3  Remember:     [address]ia [address], [place], [place]     What time is it? yes 0 Yes = 0    No = 3  Count backwards from 20 to 1 yes 0 Correct = 0    1 error = 2   More than 1 error = 4  Say the months of the  year in reverse. yes 0 Correct = 0    1 error = 2   More than 1 error = 4  What address did I ask you to remember? yes 0 Correct = 0  1 error = 2    2 error = 4    3 error = 6    4 error = 8    All wrong = 10       TOTAL SCORE  0/28   Interpretation:  Normal  Normal (0-7) Abnormal (8-28)     Assessment & Plan:     Initial Preventative Physical Exam  Reviewed patient's Family Medical History Reviewed and updated list of patient's medical providers Assessment of cognitive impairment was done Assessed patient's functional ability Established a written schedule for health screening [place] Completed and Reviewed  Exercise Activities and Dietary recommendations Goals    None      Immunization History  Administered Date(s) Administered  . Influenza,inj,Quad PF,36+ Mos 12/06/2014  . Influenza-Unspecified 11/13/2013  . Pneumococcal Conjugate-13 12/18/2014  . Pneumococcal Polysaccharide-23 05/13/2012    Health Maintenance  Topic Date Due  . FOOT EXAM  08/19/1948  . OPHTHALMOLOGY EXAM  08/19/1948  . URINE MICROALBUMIN  08/19/1948  . TETANUS/TDAP   08/19/1957  . ZOSTAVAX  08/20/1998  . PNA vac Low Risk Adult (2 of 2 - PCV13) 05/13/2013  . HEMOGLOBIN A1C  06/06/2015  . INFLUENZA VACCINE  09/10/2015  . DEXA SCAN  Completed      Discussed health benefits of physical activity, and encouraged her to engage in regular exercise appropriate for her age and condition.    ------------------------------------------------------------------------------------------------------------   Problem List Items Addressed This Visit    None    Visit Diagnoses    Medicare annual wellness visit, subsequent    -  Primary    Relevant Orders    MM Digital Screening    Pneumococcal conjugate vaccine 13-valent (Completed)    Breast cancer screening        Relevant Orders    MM Digital Screening        Otilio Miu, MD [facility]  12/18/2014

## 2015-01-25 ENCOUNTER — Other Ambulatory Visit: Payer: Self-pay | Admitting: Family Medicine

## 2015-01-25 ENCOUNTER — Ambulatory Visit
Admission: RE | Admit: 2015-01-25 | Discharge: 2015-01-25 | Disposition: A | Discharge status: Home / Self Care | Payer: Medicare Other | Source: Ambulatory Visit | Attending: Family Medicine | Admitting: Family Medicine

## 2015-01-25 DIAGNOSIS — Z1239 Encounter for other screening for malignant neoplasm of breast: Secondary | ICD-10-CM

## 2015-01-25 DIAGNOSIS — Z0000 Encounter for general adult medical examination without abnormal findings: Secondary | ICD-10-CM

## 2015-01-25 DIAGNOSIS — Z1231 Encounter for screening mammogram for malignant neoplasm of breast: Secondary | ICD-10-CM | POA: Diagnosis not present

## 2015-04-09 ENCOUNTER — Other Ambulatory Visit: Payer: Self-pay | Admitting: Family Medicine

## 2015-04-10 ENCOUNTER — Other Ambulatory Visit: Payer: Self-pay | Admitting: Family Medicine

## 2015-04-22 DIAGNOSIS — E119 Type 2 diabetes mellitus without complications: Secondary | ICD-10-CM | POA: Diagnosis not present

## 2015-04-22 DIAGNOSIS — B351 Tinea unguium: Secondary | ICD-10-CM | POA: Diagnosis not present

## 2015-04-22 DIAGNOSIS — D2370 Other benign neoplasm of skin of unspecified lower limb, including hip: Secondary | ICD-10-CM | POA: Diagnosis not present

## 2015-05-30 ENCOUNTER — Other Ambulatory Visit: Payer: Self-pay

## 2015-05-30 DIAGNOSIS — E785 Hyperlipidemia, unspecified: Secondary | ICD-10-CM

## 2015-05-30 MED ORDER — MEGARED OMEGA-3 KRILL OIL 500 MG PO CAPS: 1.0000 | ORAL_CAPSULE | Freq: Two times a day (BID) | ORAL | Status: DC

## 2015-05-30 MED ORDER — PRAVASTATIN SODIUM 40 MG PO TABS: 40.0000 mg | ORAL_TABLET | Freq: Every day | ORAL | Status: DC

## 2015-06-14 ENCOUNTER — Other Ambulatory Visit: Payer: Self-pay | Admitting: Family Medicine

## 2015-06-18 ENCOUNTER — Encounter: Payer: Self-pay | Admitting: Family Medicine

## 2015-06-18 ENCOUNTER — Ambulatory Visit (INDEPENDENT_AMBULATORY_CARE_PROVIDER_SITE_OTHER): Payer: Medicare HMO | Admitting: Family Medicine

## 2015-06-18 VITALS — BP 120/62 | HR 68 | Ht 65.0 in | Wt 135.0 lb

## 2015-06-18 DIAGNOSIS — I10 Essential (primary) hypertension: Secondary | ICD-10-CM | POA: Diagnosis not present

## 2015-06-18 DIAGNOSIS — E7800 Pure hypercholesterolemia, unspecified: Secondary | ICD-10-CM

## 2015-06-18 DIAGNOSIS — E785 Hyperlipidemia, unspecified: Secondary | ICD-10-CM | POA: Diagnosis not present

## 2015-06-18 DIAGNOSIS — E119 Type 2 diabetes mellitus without complications: Secondary | ICD-10-CM | POA: Diagnosis not present

## 2015-06-18 DIAGNOSIS — M7542 Impingement syndrome of left shoulder: Secondary | ICD-10-CM

## 2015-06-18 MED ORDER — HYDROCHLOROTHIAZIDE 25 MG PO TABS: 25.0000 mg | ORAL_TABLET | Freq: Every day | ORAL | Status: DC

## 2015-06-18 MED ORDER — OLMESARTAN MEDOXOMIL 40 MG PO TABS: 40.0000 mg | ORAL_TABLET | Freq: Every day | ORAL | Status: DC

## 2015-06-18 MED ORDER — PRAVASTATIN SODIUM 40 MG PO TABS: 40.0000 mg | ORAL_TABLET | Freq: Every day | ORAL | Status: DC

## 2015-06-18 MED ORDER — METFORMIN HCL 500 MG PO TABS: ORAL_TABLET | ORAL | Status: DC

## 2015-06-18 MED ORDER — MEGARED OMEGA-3 KRILL OIL 500 MG PO CAPS: 1.0000 | ORAL_CAPSULE | Freq: Two times a day (BID) | ORAL | Status: DC

## 2015-06-18 MED ORDER — CARVEDILOL 6.25 MG PO TABS: 6.2500 mg | ORAL_TABLET | Freq: Two times a day (BID) | ORAL | Status: DC

## 2015-06-18 NOTE — Progress Notes (Signed)
Name: Vanessa Zamora   MRN: [id number]    DOB: 07/17/1940   Date:06/18/2015       Progress Note  Subjective  Chief Complaint  Chief Complaint  Patient presents with  . Hypertension  . Hyperlipidemia  . Diabetes    Hypertension This is a chronic problem. The current episode started more than 1 year ago. The problem is unchanged. The problem is controlled. Pertinent negatives include no anxiety, blurred vision, chest pain, headaches, malaise/fatigue, neck pain, orthopnea, palpitations, peripheral edema, PND, shortness of breath or sweats. There are no associated agents to hypertension. Risk factors for coronary artery disease include diabetes mellitus and dyslipidemia. Past treatments include ACE inhibitors, diuretics and beta blockers. The current treatment provides mild improvement. There are no compliance problems.  Hypertensive end-organ damage includes CAD/MI. There is no history of angina, kidney disease, CVA, heart failure, left ventricular hypertrophy, PVD, renovascular disease or retinopathy. There is no history of chronic renal disease or a hypertension causing med.  Hyperlipidemia The current episode started more than 1 year ago. The problem is controlled. Recent lipid tests were reviewed and are normal. She has no history of chronic renal disease, diabetes, hypothyroidism, liver disease, obesity or nephrotic syndrome. Factors aggravating her hyperlipidemia include thiazides. Pertinent negatives include no chest pain, focal sensory loss, focal weakness, leg pain, myalgias or shortness of breath. The current treatment provides mild improvement of lipids. There are no compliance problems.  Risk factors for coronary artery disease include diabetes mellitus, dyslipidemia and post-menopausal.  Diabetes She presents for her follow-up diabetic visit. She has type 2 diabetes mellitus. Her disease course has been stable. Pertinent negatives for hypoglycemia include no confusion, dizziness,  headaches, hunger, mood changes, nervousness/anxiousness, pallor, seizures, sleepiness, speech difficulty, sweats or tremors. Pertinent negatives for diabetes include no blurred vision, no chest pain, no fatigue, no foot paresthesias, no foot ulcerations, no polydipsia, no polyphagia, no polyuria, no visual change, no weakness and no weight loss. (Eye exam 11/16) There are no hypoglycemic complications. Symptoms are stable. There are no diabetic complications. Pertinent negatives for diabetic complications include no CVA, PVD or retinopathy. Current diabetic treatment includes oral agent (monotherapy). She is compliant with treatment all of the time. She participates in exercise three times a week. Her breakfast blood glucose is taken between 8-9 am. Her breakfast blood glucose range is generally 90-110 mg/dl. An ACE inhibitor/angiotensin II receptor blocker is being taken. She sees a podiatrist.Eye exam is current.  Shoulder Pain  The pain is present in the left shoulder. This is a chronic problem. The current episode started more than 1 month ago (several months). There has been no history of extremity trauma. The problem has been unchanged. The quality of the pain is described as aching. Pertinent negatives include no fever, inability to bear weight, itching, joint locking, joint swelling, limited range of motion, numbness, stiffness or tingling. She has tried heat (asper-cream) for the symptoms. The treatment provided mild relief. There is no history of diabetes.    No problem-specific assessment & plan notes found for this encounter.   Past Medical History  Diagnosis Date  . Myocardial infarction ([place]) 2005  . Hypertension   . Coronary artery disease   . Diabetes mellitus without complication ([place])   . Hypercholesteremia   . Seasonal allergies   . Thyroid goiter   . Arthritis     shoulders, knees  . Wears dentures     partial lower    Past Surgical History  Procedure Laterality Date  .  Vaginal hysterectomy  02/10/1980  . Cardiac catheterization  2005  . Cataract extraction Left 10/2014  . Cataract extraction w/phaco Right 11/19/2014    Procedure: CATARACT EXTRACTION PHACO AND INTRAOCULAR LENS PLACEMENT (IOC);  Surgeon: Ronnell Freshwater, MD;  Location: [facility];  Service: Ophthalmology;  Laterality: Right;  DIABETIC - oral meds    Family History  Problem Relation Age of Onset  . Breast cancer Neg Hx     Social History   Social History  . Marital Status: Single    Spouse Name: N/A  . Number of Children: N/A  . Years of Education: N/A   Occupational History  . Not on file.   Social History Main Topics  . Smoking status: Former Research scientist (life sciences)  . Smokeless tobacco: Not on file     Comment: quit 2003  . Alcohol Use: No  . Drug Use: No  . Sexual Activity: Not Currently   Other Topics Concern  . Not on file   Social History Narrative    Allergies  Allergen Reactions  . Altace  [Ramipril] Anaphylaxis  . Ace Inhibitors Swelling  . Clonidine Swelling  . Losartan Potassium Other (See Comments)     Review of Systems  Constitutional: Negative for fever, chills, weight loss, malaise/fatigue and fatigue.  HENT: Negative for ear discharge, ear pain and sore throat.   Eyes: Negative for blurred vision.  Respiratory: Negative for cough, sputum production, shortness of breath and wheezing.   Cardiovascular: Negative for chest pain, palpitations, orthopnea, leg swelling and PND.  Gastrointestinal: Negative for heartburn, nausea, abdominal pain, diarrhea, constipation, blood in stool and melena.  Genitourinary: Negative for dysuria, urgency, frequency and hematuria.  Musculoskeletal: Negative for myalgias, back pain, joint pain, stiffness and neck pain.  Skin: Negative for itching, pallor and rash.  Neurological: Negative for dizziness, tingling, tremors, sensory change, focal weakness, seizures, speech difficulty, weakness, numbness and headaches.   Endo/Heme/Allergies: Negative for environmental allergies, polydipsia and polyphagia. Does not bruise/bleed easily.  Psychiatric/Behavioral: Negative for depression, suicidal ideas and confusion. The patient is not nervous/anxious and does not have insomnia.      Objective  Filed Vitals:   06/18/15 0856  BP: 120/62  Pulse: 68  Height: 5\' 5"  (1.651 m)  Weight: 135 lb (61.236 kg)    Physical Exam  Constitutional: She is well-developed, well-nourished, and in no distress. No distress.  HENT:  Head: Normocephalic and atraumatic.  Right Ear: External ear normal.  Left Ear: External ear normal.  Nose: Nose normal.  Mouth/Throat: Oropharynx is clear and moist.  Eyes: Conjunctivae and EOM are normal. Pupils are equal, round, and reactive to light. Right eye exhibits no discharge. Left eye exhibits no discharge.  Neck: Normal range of motion. Neck supple. No JVD present. No thyromegaly present.  Cardiovascular: Normal rate, regular rhythm, normal heart sounds and intact distal pulses.  Exam reveals no gallop and no friction rub.   No murmur heard. Pulmonary/Chest: Effort normal and breath sounds normal.  Abdominal: Soft. Bowel sounds are normal. She exhibits no mass. There is no tenderness. There is no guarding.  Musculoskeletal: Normal range of motion. She exhibits no edema.  Lymphadenopathy:    She has no cervical adenopathy.  Neurological: She is alert. She has normal reflexes.  Skin: Skin is warm and dry. She is not diaphoretic.  Psychiatric: Mood and affect normal.  Nursing note and vitals reviewed.     Assessment & Plan  Problem List Items Addressed This Visit  Cardiovascular and Mediastinum   Essential (primary) hypertension - Primary   Relevant Medications   hydrochlorothiazide (HYDRODIURIL) 25 MG tablet   carvedilol (COREG) 6.25 MG tablet   olmesartan (BENICAR) 40 MG tablet   pravastatin (PRAVACHOL) 40 MG tablet   Other Relevant Orders   Renal Function Panel      Endocrine   Diabetes mellitus with no complication (H[facility])   Relevant Medications   metFORMIN (GLUCOPHAGE) 500 MG tablet   olmesartan (BENICAR) 40 MG tablet   pravastatin (PRAVACHOL) 40 MG tablet   Other Relevant Orders   Hemoglobin A1c   Microalbumin / creatinine urine ratio     Other   Hypercholesteremia   Relevant Medications   hydrochlorothiazide (HYDRODIURIL) 25 MG tablet   MEGARED OMEGA-3 KRILL OIL 500 MG CAPS   carvedilol (COREG) 6.25 MG tablet   olmesartan (BENICAR) 40 MG tablet   pravastatin (PRAVACHOL) 40 MG tablet   Other Relevant Orders   Lipid Profile   Dyslipidemia   Relevant Medications   pravastatin (PRAVACHOL) 40 MG tablet    Other Visit Diagnoses    Impingement syndrome of left shoulder        Relevant Orders    Ambulatory referral to Orthopedic Surgery    Essential hypertension        Relevant Medications    hydrochlorothiazide (HYDRODIURIL) 25 MG tablet    carvedilol (COREG) 6.25 MG tablet    olmesartan (BENICAR) 40 MG tablet    pravastatin (PRAVACHOL) 40 MG tablet         Dr. Deanna Jones [facility]  06/18/2015

## 2015-06-19 LAB — LIPID PANEL
CHOL/HDL RATIO: 2 ratio (ref 0.0–4.4)
Cholesterol, Total: 189 mg/dL (ref 100–199)
HDL: 93 mg/dL (ref >39)
LDL CALC: 86 mg/dL (ref 0–99)
Triglycerides: 52 mg/dL (ref 0–149)
VLDL Cholesterol Cal: 10 mg/dL (ref 5–40)

## 2015-06-19 LAB — RENAL FUNCTION PANEL
Albumin: 4.4 g/dL (ref 3.5–4.8)
BUN / CREAT RATIO: 30 — AB (ref 12–28)
BUN: 20 mg/dL (ref 8–27)
CO2: 24 mmol/L (ref 18–29)
CREATININE: 0.67 mg/dL (ref 0.57–1.00)
Calcium: 9.7 mg/dL (ref 8.7–10.3)
Chloride: 100 mmol/L (ref 96–106)
GFR, EST AFRICAN AMERICAN: 99 mL/min/{1.73_m2} (ref >59)
GFR, EST NON AFRICAN AMERICAN: 86 mL/min/{1.73_m2} (ref >59)
GLUCOSE: 84 mg/dL (ref 65–99)
POTASSIUM: 4.6 mmol/L (ref 3.5–5.2)
Phosphorus: 3.7 mg/dL (ref 2.5–4.5)
SODIUM: 143 mmol/L (ref 134–144)

## 2015-06-19 LAB — MICROALBUMIN / CREATININE URINE RATIO
CREATININE, UR: 153.9 mg/dL
MICROALB/CREAT RATIO: 9.7 mg/g creat (ref 0.0–30.0)
MICROALBUM., U, RANDOM: 15 ug/mL

## 2015-06-19 LAB — HEMOGLOBIN A1C
Est. average glucose Bld gHb Est-mCnc: 146 mg/dL
Hgb A1c MFr Bld: 6.7 % — ABNORMAL HIGH (ref 4.8–5.6)

## 2015-06-20 DIAGNOSIS — M19012 Primary osteoarthritis, left shoulder: Secondary | ICD-10-CM | POA: Diagnosis not present

## 2015-06-20 DIAGNOSIS — M25512 Pain in left shoulder: Secondary | ICD-10-CM | POA: Diagnosis not present

## 2015-08-07 DIAGNOSIS — R011 Cardiac murmur, unspecified: Secondary | ICD-10-CM | POA: Diagnosis not present

## 2015-08-07 DIAGNOSIS — I10 Essential (primary) hypertension: Secondary | ICD-10-CM | POA: Diagnosis not present

## 2015-08-07 DIAGNOSIS — M1990 Unspecified osteoarthritis, unspecified site: Secondary | ICD-10-CM | POA: Diagnosis not present

## 2015-08-07 DIAGNOSIS — I429 Cardiomyopathy, unspecified: Secondary | ICD-10-CM | POA: Diagnosis not present

## 2015-08-07 DIAGNOSIS — E079 Disorder of thyroid, unspecified: Secondary | ICD-10-CM | POA: Diagnosis not present

## 2015-08-07 DIAGNOSIS — E119 Type 2 diabetes mellitus without complications: Secondary | ICD-10-CM | POA: Diagnosis not present

## 2015-08-07 DIAGNOSIS — E784 Other hyperlipidemia: Secondary | ICD-10-CM | POA: Diagnosis not present

## 2015-08-07 DIAGNOSIS — I2510 Atherosclerotic heart disease of native coronary artery without angina pectoris: Secondary | ICD-10-CM | POA: Diagnosis not present

## 2015-08-08 DIAGNOSIS — Z0120 Encounter for dental examination and cleaning without abnormal findings: Secondary | ICD-10-CM | POA: Diagnosis not present

## 2015-08-30 DIAGNOSIS — E785 Hyperlipidemia, unspecified: Secondary | ICD-10-CM | POA: Diagnosis not present

## 2015-08-30 DIAGNOSIS — I10 Essential (primary) hypertension: Secondary | ICD-10-CM | POA: Diagnosis not present

## 2015-08-30 DIAGNOSIS — L42 Pityriasis rosea: Secondary | ICD-10-CM | POA: Diagnosis not present

## 2015-08-30 DIAGNOSIS — E049 Nontoxic goiter, unspecified: Secondary | ICD-10-CM | POA: Diagnosis not present

## 2015-08-30 DIAGNOSIS — E118 Type 2 diabetes mellitus with unspecified complications: Secondary | ICD-10-CM | POA: Diagnosis not present

## 2015-08-30 DIAGNOSIS — E0590 Thyrotoxicosis, unspecified without thyrotoxic crisis or storm: Secondary | ICD-10-CM | POA: Diagnosis not present

## 2015-09-01 ENCOUNTER — Other Ambulatory Visit: Payer: Self-pay | Admitting: Family Medicine

## 2015-09-01 DIAGNOSIS — E785 Hyperlipidemia, unspecified: Secondary | ICD-10-CM

## 2015-09-02 DIAGNOSIS — B351 Tinea unguium: Secondary | ICD-10-CM | POA: Diagnosis not present

## 2015-09-02 DIAGNOSIS — D2370 Other benign neoplasm of skin of unspecified lower limb, including hip: Secondary | ICD-10-CM | POA: Diagnosis not present

## 2015-09-02 DIAGNOSIS — E119 Type 2 diabetes mellitus without complications: Secondary | ICD-10-CM | POA: Diagnosis not present

## 2015-09-06 DIAGNOSIS — E118 Type 2 diabetes mellitus with unspecified complications: Secondary | ICD-10-CM | POA: Diagnosis not present

## 2015-09-06 DIAGNOSIS — L42 Pityriasis rosea: Secondary | ICD-10-CM | POA: Diagnosis not present

## 2015-09-06 DIAGNOSIS — I10 Essential (primary) hypertension: Secondary | ICD-10-CM | POA: Diagnosis not present

## 2015-09-06 DIAGNOSIS — E785 Hyperlipidemia, unspecified: Secondary | ICD-10-CM | POA: Diagnosis not present

## 2015-09-06 DIAGNOSIS — E0590 Thyrotoxicosis, unspecified without thyrotoxic crisis or storm: Secondary | ICD-10-CM | POA: Diagnosis not present

## 2015-09-06 DIAGNOSIS — E049 Nontoxic goiter, unspecified: Secondary | ICD-10-CM | POA: Diagnosis not present

## 2015-09-09 DIAGNOSIS — E118 Type 2 diabetes mellitus with unspecified complications: Secondary | ICD-10-CM | POA: Diagnosis not present

## 2015-09-09 DIAGNOSIS — L42 Pityriasis rosea: Secondary | ICD-10-CM | POA: Diagnosis not present

## 2015-09-09 DIAGNOSIS — E785 Hyperlipidemia, unspecified: Secondary | ICD-10-CM | POA: Diagnosis not present

## 2015-09-09 DIAGNOSIS — E049 Nontoxic goiter, unspecified: Secondary | ICD-10-CM | POA: Diagnosis not present

## 2015-09-09 DIAGNOSIS — I10 Essential (primary) hypertension: Secondary | ICD-10-CM | POA: Diagnosis not present

## 2015-09-09 DIAGNOSIS — D509 Iron deficiency anemia, unspecified: Secondary | ICD-10-CM | POA: Diagnosis not present

## 2015-09-09 DIAGNOSIS — E0590 Thyrotoxicosis, unspecified without thyrotoxic crisis or storm: Secondary | ICD-10-CM | POA: Diagnosis not present

## 2015-09-19 ENCOUNTER — Other Ambulatory Visit: Payer: Self-pay | Admitting: Family Medicine

## 2015-09-19 DIAGNOSIS — I10 Essential (primary) hypertension: Secondary | ICD-10-CM

## 2015-09-20 ENCOUNTER — Other Ambulatory Visit: Payer: Self-pay

## 2015-09-23 ENCOUNTER — Ambulatory Visit (INDEPENDENT_AMBULATORY_CARE_PROVIDER_SITE_OTHER): Payer: Medicare HMO | Admitting: Family Medicine

## 2015-09-23 ENCOUNTER — Encounter: Payer: Self-pay | Admitting: Family Medicine

## 2015-09-23 VITALS — BP 120/62 | HR 80 | Ht 65.0 in | Wt 135.0 lb

## 2015-09-23 DIAGNOSIS — D649 Anemia, unspecified: Secondary | ICD-10-CM | POA: Diagnosis not present

## 2015-09-23 NOTE — Progress Notes (Signed)
Name: Vanessa Zamora   MRN: [id number]    DOB: [birth year]   Date:09/23/2015       Progress Note  Subjective  Chief Complaint  Chief Complaint  Patient presents with  . Follow-up    labs drawn by Dr Truddie Coco- he wanted the hgb to be redrawn and to see if she is losing blood somehow that is not visible    Anemia  Presents for initial visit. There has been no abdominal pain, anorexia, bruising/bleeding easily, confusion, fever, leg swelling, light-headedness, malaise/fatigue, pallor, palpitations, paresthesias or weight loss. Signs of blood loss that are not present include hematemesis, hematochezia, melena, menorrhagia and vaginal bleeding. Past treatments include nothing. Past medical history includes hypothyroidism. There is no history of alcohol abuse, cancer, chronic liver disease, chronic renal disease, clotting disorder, dementia, heart failure, hemoglobinopathy, malabsorption, neuropathy or recent illness. Procedure history includes colonoscopy. There is no past history of bone marrow exam or EGD.    No problem-specific Assessment & Plan notes found for this encounter.   Past Medical History:  Diagnosis Date  . Arthritis    shoulders, knees  . Coronary artery disease   . Diabetes mellitus without complication ([place])   . Hypercholesteremia   . Hypertension   . Myocardial infarction ([place]) 2005  . Seasonal allergies   . Thyroid goiter   . Wears dentures    partial lower    Past Surgical History:  Procedure Laterality Date  . CARDIAC CATHETERIZATION  2005  . CATARACT EXTRACTION Left 10/2014  . CATARACT EXTRACTION W/PHACO Right 11/19/2014   Procedure: CATARACT EXTRACTION PHACO AND INTRAOCULAR LENS PLACEMENT ([place]);  Surgeon: Ronnell Freshwater, MD;  Location: [facility];  Service: Ophthalmology;  Laterality: Right;  DIABETIC - oral meds  . VAGINAL HYSTERECTOMY  02/10/1980    Family History  Problem Relation Age of Onset  . Breast cancer Neg Hx     Social  History   Social History  . Marital status: Single    Spouse name: N/A  . Number of children: N/A  . Years of education: N/A   Occupational History  . Not on file.   Social History Main Topics  . Smoking status: Former Research scientist (life sciences)  . Smokeless tobacco: Not on file     Comment: quit 2003  . Alcohol use No  . Drug use: No  . Sexual activity: Not Currently   Other Topics Concern  . Not on file   Social History Narrative  . No narrative on file    Allergies  Allergen Reactions  . Altace  [Ramipril] Anaphylaxis  . Ace Inhibitors Swelling  . Clonidine Swelling  . Losartan Potassium Other (See Comments)     Review of Systems  Constitutional: Negative for chills, fever, malaise/fatigue and weight loss.  HENT: Negative for ear discharge, ear pain and sore throat.   Eyes: Negative for blurred vision.  Respiratory: Negative for cough, sputum production, shortness of breath and wheezing.   Cardiovascular: Negative for chest pain, palpitations and leg swelling.  Gastrointestinal: Negative for abdominal pain, anorexia, blood in stool, constipation, diarrhea, heartburn, hematemesis, hematochezia, melena and nausea.  Genitourinary: Negative for dysuria, frequency, hematuria, menorrhagia, urgency and vaginal bleeding.  Musculoskeletal: Negative for back pain, joint pain, myalgias and neck pain.  Skin: Negative for pallor and rash.  Neurological: Negative for dizziness, tingling, sensory change, focal weakness, light-headedness, headaches and paresthesias.  Endo/Heme/Allergies: Negative for environmental allergies and polydipsia. Does not bruise/bleed easily.  Psychiatric/Behavioral: Negative for confusion, depression and suicidal ideas.  The patient is not nervous/anxious and does not have insomnia.      Objective  Vitals:   09/23/15 1430  BP: 120/62  Pulse: 80  Weight: 135 lb (61.2 kg)  Height: 5\' 5"  (1.651 m)    Physical Exam  Constitutional: She is well-developed,  well-nourished, and in no distress. No distress.  HENT:  Head: Normocephalic and atraumatic.  Right Ear: External ear normal.  Left Ear: External ear normal.  Nose: Nose normal.  Mouth/Throat: Oropharynx is clear and moist. Mucous membranes are pale, not dry and not cyanotic. No oropharyngeal exudate, posterior oropharyngeal edema or posterior oropharyngeal erythema.  Eyes: Conjunctivae, EOM and lids are normal. Pupils are equal, round, and reactive to light. Right eye exhibits no discharge. Left eye exhibits no discharge.  Neck: Normal range of motion. Neck supple. No JVD present. No thyromegaly present.  Cardiovascular: Normal rate, regular rhythm, normal heart sounds and intact distal pulses.  Exam reveals no gallop and no friction rub.   No murmur heard. Pulmonary/Chest: Effort normal and breath sounds normal.  Abdominal: Soft. Bowel sounds are normal. She exhibits no mass. There is no tenderness. There is no guarding.  Musculoskeletal: Normal range of motion. She exhibits no edema.  Lymphadenopathy:    She has no cervical adenopathy.  Neurological: She is alert. She has normal reflexes.  Skin: Skin is warm and dry. She is not diaphoretic.  Psychiatric: Mood and affect normal.  Nursing note and vitals reviewed.     Assessment & Plan  Problem List Items Addressed This Visit    None    Visit Diagnoses    Anemia, unspecified anemia type    -  Primary   Relevant Orders   CBC   Ferritin   Iron        Dr. Otilio Miu [facility]  09/23/15

## 2015-09-24 LAB — CBC
Hematocrit: 35.8 % (ref 34.0–46.6)
Hemoglobin: 10.6 g/dL — ABNORMAL LOW (ref 11.1–15.9)
MCH: 22.6 pg — ABNORMAL LOW (ref 26.6–33.0)
MCHC: 29.6 g/dL — ABNORMAL LOW (ref 31.5–35.7)
MCV: 76 fL — ABNORMAL LOW (ref 79–97)
Platelets: 294 10*3/uL (ref 150–379)
RBC: 4.69 x10E6/uL (ref 3.77–5.28)
RDW: 16.6 % — ABNORMAL HIGH (ref 12.3–15.4)
WBC: 7.2 10*3/uL (ref 3.4–10.8)

## 2015-09-24 LAB — FERRITIN: Ferritin: 28 ng/mL (ref 15–150)

## 2015-09-24 LAB — IRON: Iron: 79 ug/dL (ref 27–139)

## 2015-10-04 ENCOUNTER — Other Ambulatory Visit (INDEPENDENT_AMBULATORY_CARE_PROVIDER_SITE_OTHER): Payer: Medicare HMO

## 2015-10-04 DIAGNOSIS — Z1211 Encounter for screening for malignant neoplasm of colon: Secondary | ICD-10-CM

## 2015-10-04 LAB — HEMOCCULT GUIAC POC 1CARD (OFFICE)
Card #3 Fecal Occult Blood, POC: NEGATIVE
FECAL OCCULT BLD: NEGATIVE
FECAL OCCULT BLD: NEGATIVE

## 2015-10-07 ENCOUNTER — Other Ambulatory Visit: Payer: Self-pay

## 2015-10-09 DIAGNOSIS — M7551 Bursitis of right shoulder: Secondary | ICD-10-CM | POA: Diagnosis not present

## 2015-11-12 ENCOUNTER — Ambulatory Visit (INDEPENDENT_AMBULATORY_CARE_PROVIDER_SITE_OTHER): Payer: Medicare HMO

## 2015-11-12 DIAGNOSIS — E0590 Thyrotoxicosis, unspecified without thyrotoxic crisis or storm: Secondary | ICD-10-CM | POA: Diagnosis not present

## 2015-11-12 DIAGNOSIS — Z23 Encounter for immunization: Secondary | ICD-10-CM | POA: Diagnosis not present

## 2015-11-12 DIAGNOSIS — D509 Iron deficiency anemia, unspecified: Secondary | ICD-10-CM | POA: Diagnosis not present

## 2015-11-12 DIAGNOSIS — E118 Type 2 diabetes mellitus with unspecified complications: Secondary | ICD-10-CM | POA: Diagnosis not present

## 2015-11-21 DIAGNOSIS — E0590 Thyrotoxicosis, unspecified without thyrotoxic crisis or storm: Secondary | ICD-10-CM | POA: Diagnosis not present

## 2015-11-21 DIAGNOSIS — E049 Nontoxic goiter, unspecified: Secondary | ICD-10-CM | POA: Diagnosis not present

## 2015-11-21 DIAGNOSIS — E785 Hyperlipidemia, unspecified: Secondary | ICD-10-CM | POA: Diagnosis not present

## 2015-11-21 DIAGNOSIS — L42 Pityriasis rosea: Secondary | ICD-10-CM | POA: Diagnosis not present

## 2015-11-21 DIAGNOSIS — D509 Iron deficiency anemia, unspecified: Secondary | ICD-10-CM | POA: Diagnosis not present

## 2015-11-21 DIAGNOSIS — E118 Type 2 diabetes mellitus with unspecified complications: Secondary | ICD-10-CM | POA: Diagnosis not present

## 2015-11-21 DIAGNOSIS — I10 Essential (primary) hypertension: Secondary | ICD-10-CM | POA: Diagnosis not present

## 2015-12-06 ENCOUNTER — Other Ambulatory Visit: Payer: Self-pay

## 2015-12-06 DIAGNOSIS — I10 Essential (primary) hypertension: Secondary | ICD-10-CM

## 2015-12-12 ENCOUNTER — Other Ambulatory Visit: Payer: Self-pay | Admitting: Family Medicine

## 2015-12-12 DIAGNOSIS — I10 Essential (primary) hypertension: Secondary | ICD-10-CM

## 2015-12-19 ENCOUNTER — Ambulatory Visit (INDEPENDENT_AMBULATORY_CARE_PROVIDER_SITE_OTHER): Payer: Medicare HMO | Admitting: Family Medicine

## 2015-12-19 ENCOUNTER — Encounter: Payer: Self-pay | Admitting: Family Medicine

## 2015-12-19 VITALS — BP 122/68 | HR 68 | Ht 65.0 in | Wt 131.0 lb

## 2015-12-19 DIAGNOSIS — G9389 Other specified disorders of brain: Secondary | ICD-10-CM | POA: Diagnosis not present

## 2015-12-19 DIAGNOSIS — D649 Anemia, unspecified: Secondary | ICD-10-CM

## 2015-12-19 DIAGNOSIS — R001 Bradycardia, unspecified: Secondary | ICD-10-CM | POA: Diagnosis not present

## 2015-12-19 DIAGNOSIS — I252 Old myocardial infarction: Secondary | ICD-10-CM | POA: Diagnosis not present

## 2015-12-19 DIAGNOSIS — E119 Type 2 diabetes mellitus without complications: Secondary | ICD-10-CM

## 2015-12-19 DIAGNOSIS — S0990XA Unspecified injury of head, initial encounter: Secondary | ICD-10-CM | POA: Diagnosis not present

## 2015-12-19 DIAGNOSIS — R4182 Altered mental status, unspecified: Secondary | ICD-10-CM | POA: Diagnosis not present

## 2015-12-19 DIAGNOSIS — E079 Disorder of thyroid, unspecified: Secondary | ICD-10-CM | POA: Diagnosis not present

## 2015-12-19 DIAGNOSIS — E785 Hyperlipidemia, unspecified: Secondary | ICD-10-CM

## 2015-12-19 DIAGNOSIS — I10 Essential (primary) hypertension: Secondary | ICD-10-CM

## 2015-12-19 DIAGNOSIS — G454 Transient global amnesia: Secondary | ICD-10-CM

## 2015-12-19 DIAGNOSIS — R9431 Abnormal electrocardiogram [ECG] [EKG]: Secondary | ICD-10-CM | POA: Diagnosis not present

## 2015-12-19 DIAGNOSIS — I499 Cardiac arrhythmia, unspecified: Secondary | ICD-10-CM

## 2015-12-19 DIAGNOSIS — Z1239 Encounter for other screening for malignant neoplasm of breast: Secondary | ICD-10-CM

## 2015-12-19 DIAGNOSIS — R413 Other amnesia: Secondary | ICD-10-CM | POA: Diagnosis not present

## 2015-12-19 DIAGNOSIS — Z1231 Encounter for screening mammogram for malignant neoplasm of breast: Secondary | ICD-10-CM | POA: Diagnosis not present

## 2015-12-19 DIAGNOSIS — I2510 Atherosclerotic heart disease of native coronary artery without angina pectoris: Secondary | ICD-10-CM | POA: Diagnosis not present

## 2015-12-19 DIAGNOSIS — I493 Ventricular premature depolarization: Secondary | ICD-10-CM | POA: Diagnosis not present

## 2015-12-19 MED ORDER — PRAVASTATIN SODIUM 40 MG PO TABS
40.0000 mg | ORAL_TABLET | Freq: Every day | ORAL | 1 refills | Status: DC
Start: 2015-12-19 — End: 2016-07-28

## 2015-12-19 MED ORDER — METFORMIN HCL 500 MG PO TABS: ORAL_TABLET | ORAL | 1 refills | Status: DC

## 2015-12-19 MED ORDER — CARVEDILOL 6.25 MG PO TABS: 6.2500 mg | ORAL_TABLET | Freq: Two times a day (BID) | ORAL | 1 refills | Status: DC

## 2015-12-19 MED ORDER — OLMESARTAN MEDOXOMIL 40 MG PO TABS: 40.0000 mg | ORAL_TABLET | Freq: Every day | ORAL | 5 refills | Status: DC

## 2015-12-19 MED ORDER — HYDROCHLOROTHIAZIDE 25 MG PO TABS: 25.0000 mg | ORAL_TABLET | Freq: Every day | ORAL | 1 refills | Status: DC

## 2015-12-19 NOTE — Progress Notes (Signed)
Name: Vanessa Zamora   MRN: [id number]    DOB: August 14, 1940   Date:12/19/2015       Progress Note  Subjective  Chief Complaint  Chief Complaint  Patient presents with  . Follow-up    had episode Sunday in church of repeating the same question and then appearing to be normal but doesn't recall any of the remaining service.  . Diabetes    needs labs    Diabetes  She presents for her follow-up diabetic visit. She has type 2 diabetes mellitus. Her disease course has been stable. Hypoglycemia symptoms include confusion. Pertinent negatives for hypoglycemia include no dizziness, headaches, hunger, mood changes, nervousness/anxiousness, pallor, sleepiness, speech difficulty, sweats or tremors. Pertinent negatives for diabetes include no blurred vision, no chest pain, no foot paresthesias, no foot ulcerations, no polydipsia, no polyphagia, no polyuria, no visual change, no weakness and no weight loss. There are no hypoglycemic complications. Symptoms are stable. There are no diabetic complications. Pertinent negatives for diabetic complications include no CVA, PVD or retinopathy. Risk factors for coronary artery disease include diabetes mellitus and dyslipidemia. Current diabetic treatment includes oral agent (monotherapy). She is compliant with treatment all of the time. Her weight is stable. She is following a generally healthy diet. She has not had a previous visit with a dietitian. She participates in exercise intermittently. Her home blood glucose trend is fluctuating minimally. She does not see a podiatrist.Eye exam is current.  Hypertension  This is a chronic problem. The current episode started more than 1 year ago. The problem has been waxing and waning since onset. Pertinent negatives include no anxiety, blurred vision, chest pain, headaches, malaise/fatigue, neck pain, orthopnea, palpitations, peripheral edema, PND, shortness of breath or sweats. There are no associated agents to hypertension.  Past treatments include ACE inhibitors, beta blockers and diuretics. The current treatment provides moderate improvement. There are no compliance problems.  Hypertensive end-organ damage includes CAD/MI. There is no history of angina, kidney disease, CVA, heart failure, left ventricular hypertrophy, PVD, renovascular disease or retinopathy. There is no history of chronic renal disease or a hypertension causing med.  Hyperlipidemia  This is a chronic problem. The current episode started more than 1 year ago. The problem is controlled. Recent lipid tests were reviewed and are normal. She has no history of chronic renal disease, diabetes, hypothyroidism, liver disease or obesity. Factors aggravating her hyperlipidemia include thiazides. Pertinent negatives include no chest pain, focal sensory loss, focal weakness, myalgias or shortness of breath. Current antihyperlipidemic treatment includes statins.  Loss of Consciousness  This is a new problem. The current episode started in the past 7 days. Episode frequency: one episode. The problem has been unchanged. She lost consciousness for a period of greater than 5 minutes (episode lasted about 30 min/ No LOC). Exacerbated by: hit head 2 weeks ago getting into car. Associated symptoms include confusion. Pertinent negatives include no abdominal pain, aura, back pain, bladder incontinence, bowel incontinence, chest pain, dizziness, fever, focal sensory loss, focal weakness, headaches, light-headedness, malaise/fatigue, nausea, palpitations, slurred speech, vertigo, visual change or weakness. Associated symptoms comments: Asking same question repeatably/ retrogrde amnesia for 30 minutes. She has tried nothing for the symptoms. The treatment provided mild relief. Her past medical history is significant for CAD, DM and HTN. There is no history of CVA or TIA.    No problem-specific Assessment & Plan notes found for this encounter.   Past Medical History:  Diagnosis Date   . Arthritis    shoulders, knees  .  Coronary artery disease   . Diabetes mellitus without complication ([place])   . Hypercholesteremia   . Hypertension   . Myocardial infarction 2005  . Seasonal allergies   . Thyroid goiter   . Wears dentures    partial lower    Past Surgical History:  Procedure Laterality Date  . CARDIAC CATHETERIZATION  2005  . CATARACT EXTRACTION Left 10/2014  . CATARACT EXTRACTION W/PHACO Right 11/19/2014   Procedure: CATARACT EXTRACTION PHACO AND INTRAOCULAR LENS PLACEMENT ([place]);  Surgeon: Ronnell Freshwater, MD;  Location: [facility];  Service: Ophthalmology;  Laterality: Right;  DIABETIC - oral meds  . VAGINAL HYSTERECTOMY  02/10/1980    Family History  Problem Relation Age of Onset  . Breast cancer Neg Hx     Social History   Social History  . Marital status: Single    Spouse name: N/A  . Number of children: N/A  . Years of education: N/A   Occupational History  . Not on file.   Social History Main Topics  . Smoking status: Former Research scientist (life sciences)  . Smokeless tobacco: Not on file     Comment: quit 2003  . Alcohol use No  . Drug use: No  . Sexual activity: Not Currently   Other Topics Concern  . Not on file   Social History Narrative  . No narrative on file    Allergies  Allergen Reactions  . Altace  [Ramipril] Anaphylaxis  . Ace Inhibitors Swelling  . Clonidine Swelling  . Losartan Potassium Other (See Comments)     Review of Systems  Constitutional: Negative for chills, fever, malaise/fatigue and weight loss.  HENT: Negative for ear discharge, ear pain and sore throat.   Eyes: Negative for blurred vision and photophobia.  Respiratory: Negative for cough, sputum production, shortness of breath and wheezing.   Cardiovascular: Positive for syncope. Negative for chest pain, palpitations, orthopnea, leg swelling and PND.  Gastrointestinal: Negative for abdominal pain, blood in stool, bowel incontinence, constipation, diarrhea,  heartburn, melena and nausea.  Genitourinary: Negative for bladder incontinence, dysuria, frequency, hematuria and urgency.  Musculoskeletal: Negative for back pain, joint pain, myalgias and neck pain.  Skin: Negative for pallor and rash.  Neurological: Positive for loss of consciousness. Negative for dizziness, vertigo, tingling, tremors, sensory change, focal weakness, speech difficulty, weakness, light-headedness and headaches.       See HPI  Endo/Heme/Allergies: Negative for environmental allergies, polydipsia and polyphagia. Does not bruise/bleed easily.  Psychiatric/Behavioral: Positive for confusion. Negative for depression and suicidal ideas. The patient is not nervous/anxious and does not have insomnia.      Objective  Vitals:   12/19/15 0856  BP: 122/68  Pulse: 68  Weight: 131 lb (59.4 kg)  Height: 5\' 5"  (1.651 m)    Physical Exam  Constitutional: She is well-developed, well-nourished, and in no distress. No distress.  HENT:  Head: Normocephalic and atraumatic.  Right Ear: Tympanic membrane, external ear and ear canal normal.  Left Ear: Tympanic membrane, external ear and ear canal normal.  Nose: Nose normal.  Mouth/Throat: Oropharynx is clear and moist.  Eyes: Conjunctivae and EOM are normal. Pupils are equal, round, and reactive to light. Right eye exhibits no discharge. Left eye exhibits no discharge.  Neck: Normal range of motion. Neck supple. Normal carotid pulses, no hepatojugular reflux and no JVD present. Carotid bruit is not present. No thyromegaly present.  Cardiovascular: Normal rate, regular rhythm, normal heart sounds and intact distal pulses.  Exam reveals no gallop and no friction  rub.   No murmur heard. Pulmonary/Chest: Effort normal and breath sounds normal. She has no wheezes. She has no rales.  Abdominal: Soft. Bowel sounds are normal. She exhibits no mass. There is no tenderness. There is no guarding.  Musculoskeletal: Normal range of motion. She  exhibits no edema.  Lymphadenopathy:    She has no cervical adenopathy.  Neurological: She is alert. She has normal motor skills, normal sensation, normal strength, normal reflexes and intact cranial nerves. She displays normal speech. No cranial nerve deficit.  Skin: Skin is warm and dry. She is not diaphoretic.  Psychiatric: Mood and affect normal.  Nursing note and vitals reviewed.     Assessment & Plan  Problem List Items Addressed This Visit      Cardiovascular and Mediastinum   Essential (primary) hypertension   Relevant Medications   pravastatin (PRAVACHOL) 40 MG tablet   hydrochlorothiazide (HYDRODIURIL) 25 MG tablet   olmesartan (BENICAR) 40 MG tablet   carvedilol (COREG) 6.25 MG tablet   Other Relevant Orders   Renal Function Panel     Endocrine   Diabetes mellitus with no complication (HCC)   Relevant Medications   metFORMIN (GLUCOPHAGE) 500 MG tablet   pravastatin (PRAVACHOL) 40 MG tablet   olmesartan (BENICAR) 40 MG tablet   Other Relevant Orders   Hemoglobin A1c   Renal Function Panel     Other   Dyslipidemia   Relevant Medications   pravastatin (PRAVACHOL) 40 MG tablet   Anemia   Relevant Orders   CBC w/Diff/Platelet    Other Visit Diagnoses    Transient global amnesia    -  Primary   Relevant Orders   CT Head W Contrast   CT Head Wo Contrast   Irregularly irregular pulse rhythm       Relevant Orders   EKG 12-Lead (Completed)   Breast cancer screening       Relevant Orders   MM Digital Screening     Send to ER for further eval/ CT scan. Pt does not need to wait until next week for a CT scan due to insurance. Also, had an episode of hitting her head 2 weeks ago/ concern for subdural bleed   Dr. Otilio Miu [facility]  12/19/15

## 2015-12-20 ENCOUNTER — Other Ambulatory Visit: Payer: Self-pay

## 2015-12-20 LAB — CBC WITH DIFFERENTIAL/PLATELET
Basophils Absolute: 0 10*3/uL (ref 0.0–0.2)
Basos: 0 %
EOS (ABSOLUTE): 0 10*3/uL (ref 0.0–0.4)
Eos: 1 %
HEMATOCRIT: 35.8 % (ref 34.0–46.6)
Hemoglobin: 11 g/dL — ABNORMAL LOW (ref 11.1–15.9)
IMMATURE GRANULOCYTES: 0 %
Immature Grans (Abs): 0 10*3/uL (ref 0.0–0.1)
LYMPHS ABS: 2.2 10*3/uL (ref 0.7–3.1)
Lymphs: 44 %
MCH: 22.9 pg — ABNORMAL LOW (ref 26.6–33.0)
MCHC: 30.7 g/dL — AB (ref 31.5–35.7)
MCV: 75 fL — AB (ref 79–97)
MONOS ABS: 0.5 10*3/uL (ref 0.1–0.9)
Monocytes: 11 %
NEUTROS PCT: 44 %
Neutrophils Absolute: 2.2 10*3/uL (ref 1.4–7.0)
PLATELETS: 340 10*3/uL (ref 150–379)
RBC: 4.8 x10E6/uL (ref 3.77–5.28)
RDW: 16.9 % — AB (ref 12.3–15.4)
WBC: 5 10*3/uL (ref 3.4–10.8)

## 2015-12-20 LAB — RENAL FUNCTION PANEL
ALBUMIN: 4.1 g/dL (ref 3.5–4.8)
BUN/Creatinine Ratio: 23 (ref 12–28)
BUN: 17 mg/dL (ref 8–27)
CO2: 26 mmol/L (ref 18–29)
CREATININE: 0.75 mg/dL (ref 0.57–1.00)
Calcium: 9.7 mg/dL (ref 8.7–10.3)
Chloride: 103 mmol/L (ref 96–106)
GFR, EST AFRICAN AMERICAN: 89 mL/min/{1.73_m2} (ref >59)
GFR, EST NON AFRICAN AMERICAN: 77 mL/min/{1.73_m2} (ref >59)
GLUCOSE: 84 mg/dL (ref 65–99)
PHOSPHORUS: 3.2 mg/dL (ref 2.5–4.5)
POTASSIUM: 4.6 mmol/L (ref 3.5–5.2)
Sodium: 145 mmol/L — ABNORMAL HIGH (ref 134–144)

## 2015-12-20 LAB — HEMOGLOBIN A1C
ESTIMATED AVERAGE GLUCOSE: 134 mg/dL
Hgb A1c MFr Bld: 6.3 % — ABNORMAL HIGH (ref 4.8–5.6)

## 2015-12-23 DIAGNOSIS — E049 Nontoxic goiter, unspecified: Secondary | ICD-10-CM | POA: Diagnosis not present

## 2015-12-23 DIAGNOSIS — E0590 Thyrotoxicosis, unspecified without thyrotoxic crisis or storm: Secondary | ICD-10-CM | POA: Diagnosis not present

## 2015-12-26 DIAGNOSIS — E785 Hyperlipidemia, unspecified: Secondary | ICD-10-CM | POA: Diagnosis not present

## 2015-12-26 DIAGNOSIS — E0590 Thyrotoxicosis, unspecified without thyrotoxic crisis or storm: Secondary | ICD-10-CM | POA: Diagnosis not present

## 2015-12-26 DIAGNOSIS — D509 Iron deficiency anemia, unspecified: Secondary | ICD-10-CM | POA: Diagnosis not present

## 2015-12-26 DIAGNOSIS — E049 Nontoxic goiter, unspecified: Secondary | ICD-10-CM | POA: Diagnosis not present

## 2015-12-26 DIAGNOSIS — E118 Type 2 diabetes mellitus with unspecified complications: Secondary | ICD-10-CM | POA: Diagnosis not present

## 2015-12-26 DIAGNOSIS — I10 Essential (primary) hypertension: Secondary | ICD-10-CM | POA: Diagnosis not present

## 2015-12-26 DIAGNOSIS — L42 Pityriasis rosea: Secondary | ICD-10-CM | POA: Diagnosis not present

## 2016-01-06 DIAGNOSIS — D2370 Other benign neoplasm of skin of unspecified lower limb, including hip: Secondary | ICD-10-CM | POA: Diagnosis not present

## 2016-01-06 DIAGNOSIS — E119 Type 2 diabetes mellitus without complications: Secondary | ICD-10-CM | POA: Diagnosis not present

## 2016-01-06 DIAGNOSIS — B351 Tinea unguium: Secondary | ICD-10-CM | POA: Diagnosis not present

## 2016-01-17 DIAGNOSIS — E119 Type 2 diabetes mellitus without complications: Secondary | ICD-10-CM | POA: Diagnosis not present

## 2016-01-28 ENCOUNTER — Encounter: Payer: Self-pay | Admitting: Family Medicine

## 2016-01-28 ENCOUNTER — Ambulatory Visit (INDEPENDENT_AMBULATORY_CARE_PROVIDER_SITE_OTHER): Payer: Medicare HMO | Admitting: Family Medicine

## 2016-01-28 VITALS — BP 120/80 | HR 70 | Ht 65.0 in | Wt 130.0 lb

## 2016-01-28 DIAGNOSIS — Z0000 Encounter for general adult medical examination without abnormal findings: Secondary | ICD-10-CM

## 2016-01-28 DIAGNOSIS — Z23 Encounter for immunization: Secondary | ICD-10-CM | POA: Diagnosis not present

## 2016-01-28 NOTE — Progress Notes (Signed)
Name: Vanessa Zamora   MRN: [id number]    DOB: 13-Apr-1940   Date:01/28/2016       Progress Note  Subjective  Chief Complaint  Chief Complaint  Patient presents with  . Annual Exam    mammo sched for 12/27 and colonoscopy sched for Feb.   . Immunizations    need TDAP    Patient presents for annual physical exam.    No problem-specific Assessment & Plan notes found for this encounter.   Past Medical History:  Diagnosis Date  . Arthritis    shoulders, knees  . Coronary artery disease   . Diabetes mellitus without complication ([place])   . Hypercholesteremia   . Hypertension   . Myocardial infarction 2005  . Seasonal allergies   . Thyroid goiter   . Wears dentures    partial lower    Past Surgical History:  Procedure Laterality Date  . CARDIAC CATHETERIZATION  2005  . CATARACT EXTRACTION Left 10/2014  . CATARACT EXTRACTION W/PHACO Right 11/19/2014   Procedure: CATARACT EXTRACTION PHACO AND INTRAOCULAR LENS PLACEMENT ([place]);  Surgeon: Ronnell Freshwater, MD;  Location: [facility];  Service: Ophthalmology;  Laterality: Right;  DIABETIC - oral meds  . VAGINAL HYSTERECTOMY  02/10/1980    Family History  Problem Relation Age of Onset  . Breast cancer Neg Hx     Social History   Social History  . Marital status: Single    Spouse name: N/A  . Number of children: N/A  . Years of education: N/A   Occupational History  . Not on file.   Social History Main Topics  . Smoking status: Former Research scientist (life sciences)  . Smokeless tobacco: Not on file     Comment: quit 2003  . Alcohol use No  . Drug use: No  . Sexual activity: Not Currently   Other Topics Concern  . Not on file   Social History Narrative  . No narrative on file    Allergies  Allergen Reactions  . Altace  [Ramipril] Anaphylaxis  . Ace Inhibitors Swelling  . Clonidine Swelling  . Losartan Potassium Other (See Comments)     Review of Systems  Constitutional: Negative for chills, fever,  malaise/fatigue and weight loss.  HENT: Negative for ear discharge, ear pain and sore throat.   Eyes: Negative for blurred vision.  Respiratory: Negative for cough, sputum production, shortness of breath and wheezing.   Cardiovascular: Negative for chest pain, palpitations and leg swelling.  Gastrointestinal: Negative for abdominal pain, blood in stool, constipation, diarrhea, heartburn, melena and nausea.  Genitourinary: Negative for dysuria, frequency, hematuria and urgency.  Musculoskeletal: Negative for back pain, joint pain, myalgias and neck pain.  Skin: Negative for rash.  Neurological: Negative for dizziness, tingling, sensory change, focal weakness and headaches.  Endo/Heme/Allergies: Negative for environmental allergies and polydipsia. Does not bruise/bleed easily.  Psychiatric/Behavioral: Negative for depression and suicidal ideas. The patient is not nervous/anxious and does not have insomnia.      Objective  Vitals:   01/28/16 0824  BP: 120/80  Pulse: 70  Weight: 130 lb (59 kg)  Height: 5\' 5"  (1.651 m)    Physical Exam  Constitutional: She is oriented to person, place, and time and well-developed, well-nourished, and in no distress. No distress.  HENT:  Head: Normocephalic and atraumatic.  Right Ear: Tympanic membrane, external ear and ear canal normal.  Left Ear: Tympanic membrane, external ear and ear canal normal.  Nose: Nose normal.  Mouth/Throat: Uvula is midline, oropharynx is  clear and moist and mucous membranes are normal. No oropharyngeal exudate, posterior oropharyngeal edema, posterior oropharyngeal erythema or tonsillar abscesses.  Eyes: Conjunctivae, EOM and lids are normal. Pupils are equal, round, and reactive to light. Right eye exhibits no discharge. Left eye exhibits no discharge.  Fundoscopic exam:      The right eye shows no arteriolar narrowing, no AV nicking and no papilledema.       The left eye shows no arteriolar narrowing, no AV nicking and no  papilledema.  Neck: Trachea normal and normal range of motion. Neck supple. Normal carotid pulses, no hepatojugular reflux and no JVD present. Carotid bruit is not present. No thyroid mass and no thyromegaly present.  Cardiovascular: Normal rate, regular rhythm, S1 normal, S2 normal, intact distal pulses and normal pulses.  Exam reveals no gallop, no S3, no S4 and no friction rub.   Murmur heard.  Systolic murmur is present with a grade of 1/6  Pulmonary/Chest: Effort normal and breath sounds normal. Right breast exhibits no inverted nipple, no mass, no nipple discharge, no skin change and no tenderness. Left breast exhibits no inverted nipple, no mass, no nipple discharge, no skin change and no tenderness. Breasts are symmetrical.  Abdominal: Soft. Normal aorta and bowel sounds are normal. She exhibits no mass. There is no hepatosplenomegaly. There is no tenderness. There is no rebound, no guarding and no CVA tenderness.  Musculoskeletal: Normal range of motion. She exhibits no edema.  Lymphadenopathy:       Head (right side): No submental and no submandibular adenopathy present.       Head (left side): No submental and no submandibular adenopathy present.    She has no cervical adenopathy.    She has no axillary adenopathy.  Neurological: She is alert and oriented to person, place, and time. She has normal sensation, normal strength, normal reflexes and intact cranial nerves.  Skin: Skin is warm and dry. She is not diaphoretic.  Psychiatric: Mood, memory, affect and judgment normal.  Cognitive normal// PHQ-2 :0  Nursing note and vitals reviewed.     Assessment & Plan  Problem List Items Addressed This Visit    None    Visit Diagnoses    Annual physical exam    -  Primary   Immunization due       Relevant Orders   Tdap vaccine greater than or equal to 7yo IM (Completed)        Dr. [facility]  01/28/16

## 2016-02-05 ENCOUNTER — Ambulatory Visit
Admission: RE | Admit: 2016-02-05 | Discharge: 2016-02-05 | Disposition: A | Discharge status: Home / Self Care | Payer: Medicare HMO | Source: Ambulatory Visit | Attending: Family Medicine | Admitting: Family Medicine

## 2016-02-05 DIAGNOSIS — Z1231 Encounter for screening mammogram for malignant neoplasm of breast: Secondary | ICD-10-CM | POA: Insufficient documentation

## 2016-02-05 DIAGNOSIS — Z1239 Encounter for other screening for malignant neoplasm of breast: Secondary | ICD-10-CM

## 2016-02-06 ENCOUNTER — Other Ambulatory Visit: Payer: Self-pay

## 2016-02-24 ENCOUNTER — Other Ambulatory Visit: Payer: Self-pay | Admitting: Family Medicine

## 2016-02-24 DIAGNOSIS — E785 Hyperlipidemia, unspecified: Secondary | ICD-10-CM

## 2016-03-04 DIAGNOSIS — H26491 Other secondary cataract, right eye: Secondary | ICD-10-CM | POA: Diagnosis not present

## 2016-03-10 ENCOUNTER — Other Ambulatory Visit: Payer: Self-pay

## 2016-03-18 DIAGNOSIS — E119 Type 2 diabetes mellitus without complications: Secondary | ICD-10-CM | POA: Diagnosis not present

## 2016-03-18 DIAGNOSIS — D649 Anemia, unspecified: Secondary | ICD-10-CM | POA: Diagnosis not present

## 2016-03-18 DIAGNOSIS — Z8371 Family history of colonic polyps: Secondary | ICD-10-CM | POA: Diagnosis not present

## 2016-03-20 ENCOUNTER — Ambulatory Visit (INDEPENDENT_AMBULATORY_CARE_PROVIDER_SITE_OTHER): Payer: Medicare HMO | Admitting: Family Medicine

## 2016-03-20 VITALS — BP 130/80 | HR 80 | Temp 97.9°F | Ht 65.0 in | Wt 133.0 lb

## 2016-03-20 DIAGNOSIS — E049 Nontoxic goiter, unspecified: Secondary | ICD-10-CM | POA: Diagnosis not present

## 2016-03-20 DIAGNOSIS — E0590 Thyrotoxicosis, unspecified without thyrotoxic crisis or storm: Secondary | ICD-10-CM | POA: Diagnosis not present

## 2016-03-20 DIAGNOSIS — J0100 Acute maxillary sinusitis, unspecified: Secondary | ICD-10-CM | POA: Diagnosis not present

## 2016-03-20 DIAGNOSIS — E118 Type 2 diabetes mellitus with unspecified complications: Secondary | ICD-10-CM | POA: Diagnosis not present

## 2016-03-20 DIAGNOSIS — D509 Iron deficiency anemia, unspecified: Secondary | ICD-10-CM | POA: Diagnosis not present

## 2016-03-20 MED ORDER — AMOXICILLIN 500 MG PO CAPS: 500.0000 mg | ORAL_CAPSULE | Freq: Three times a day (TID) | ORAL | 0 refills | Status: DC

## 2016-03-20 NOTE — Progress Notes (Signed)
Name: Vanessa Zamora   MRN: [id number]    DOB: August 25, 1940   Date:03/20/2016       Progress Note  Subjective  Chief Complaint  Chief Complaint  Patient presents with  . Sinusitis    dry cough, headache, chills    Sinusitis  This is a new problem. The current episode started yesterday. The problem has been gradually worsening since onset. There has been no fever. Associated symptoms include chills, congestion, coughing, headaches and sinus pressure. Pertinent negatives include no diaphoresis, ear pain, hoarse voice, neck pain, shortness of breath, sneezing, sore throat or swollen glands. Past treatments include oral decongestants. The treatment provided mild relief.    No problem-specific Assessment & Plan notes found for this encounter.   Past Medical History:  Diagnosis Date  . Arthritis    shoulders, knees  . Coronary artery disease   . Diabetes mellitus without complication ([place])   . Hypercholesteremia   . Hypertension   . Myocardial infarction 2005  . Seasonal allergies   . Thyroid goiter   . Wears dentures    partial lower    Past Surgical History:  Procedure Laterality Date  . CARDIAC CATHETERIZATION  2005  . CATARACT EXTRACTION Left 10/2014  . CATARACT EXTRACTION W/PHACO Right 11/19/2014   Procedure: CATARACT EXTRACTION PHACO AND INTRAOCULAR LENS PLACEMENT ([place]);  Surgeon: Ronnell Freshwater, MD;  Location: [facility];  Service: Ophthalmology;  Laterality: Right;  DIABETIC - oral meds  . VAGINAL HYSTERECTOMY  02/10/1980    Family History  Problem Relation Age of Onset  . Breast cancer Neg Hx     Social History   Social History  . Marital status: Single    Spouse name: N/A  . Number of children: N/A  . Years of education: N/A   Occupational History  . Not on file.   Social History Main Topics  . Smoking status: Former Research scientist (life sciences)  . Smokeless tobacco: Not on file     Comment: quit 2003  . Alcohol use No  . Drug use: No  . Sexual  activity: Not Currently   Other Topics Concern  . Not on file   Social History Narrative  . No narrative on file    Allergies  Allergen Reactions  . Altace  [Ramipril] Anaphylaxis  . Ace Inhibitors Swelling  . Clonidine Swelling  . Losartan Potassium Other (See Comments)     Review of Systems  Constitutional: Positive for chills. Negative for diaphoresis, fever, malaise/fatigue and weight loss.  HENT: Positive for congestion and sinus pressure. Negative for ear discharge, ear pain, hoarse voice, sneezing and sore throat.   Eyes: Negative for blurred vision.  Respiratory: Positive for cough. Negative for sputum production, shortness of breath and wheezing.   Cardiovascular: Negative for chest pain, palpitations and leg swelling.  Gastrointestinal: Negative for abdominal pain, blood in stool, constipation, diarrhea, heartburn, melena and nausea.  Genitourinary: Negative for dysuria, frequency, hematuria and urgency.  Musculoskeletal: Negative for back pain, joint pain, myalgias and neck pain.  Skin: Negative for rash.  Neurological: Positive for headaches. Negative for dizziness, tingling, sensory change and focal weakness.  Endo/Heme/Allergies: Negative for environmental allergies and polydipsia. Does not bruise/bleed easily.  Psychiatric/Behavioral: Negative for depression and suicidal ideas. The patient is not nervous/anxious and does not have insomnia.      Objective  Vitals:   03/20/16 1148  BP: 130/80  Pulse: 80  Temp: 97.9 F (36.6 C)  TempSrc: Oral  Weight: 133 lb (60.3 kg)  Height: 5\' 5"  (1.651 m)    Physical Exam  Constitutional: She is well-developed, well-nourished, and in no distress. No distress.  HENT:  Head: Normocephalic and atraumatic.  Right Ear: External ear normal.  Left Ear: External ear normal.  Nose: Nose normal.  Mouth/Throat: Oropharynx is clear and moist.  Eyes: Conjunctivae and EOM are normal. Pupils are equal, round, and reactive to  light. Right eye exhibits no discharge. Left eye exhibits no discharge.  Neck: Normal range of motion. Neck supple. No JVD present. No thyromegaly present.  Cardiovascular: Normal rate, regular rhythm, normal heart sounds and intact distal pulses.  Exam reveals no gallop and no friction rub.   No murmur heard. Pulmonary/Chest: Effort normal and breath sounds normal. She has no wheezes. She has no rales.  Abdominal: Soft. Bowel sounds are normal. She exhibits no mass. There is no tenderness. There is no guarding.  Musculoskeletal: Normal range of motion. She exhibits no edema.  Lymphadenopathy:    She has no cervical adenopathy.  Neurological: She is alert. She has normal reflexes.  Skin: Skin is warm and dry. She is not diaphoretic.  Psychiatric: Mood and affect normal.  Nursing note and vitals reviewed.     Assessment & Plan  Problem List Items Addressed This Visit    None    Visit Diagnoses    Acute maxillary sinusitis, recurrence not specified    -  Primary   Relevant Medications   amoxicillin (AMOXIL) 500 MG capsule        Dr. [facility]  03/20/16

## 2016-03-26 DIAGNOSIS — L42 Pityriasis rosea: Secondary | ICD-10-CM | POA: Diagnosis not present

## 2016-03-26 DIAGNOSIS — E785 Hyperlipidemia, unspecified: Secondary | ICD-10-CM | POA: Diagnosis not present

## 2016-03-26 DIAGNOSIS — D509 Iron deficiency anemia, unspecified: Secondary | ICD-10-CM | POA: Diagnosis not present

## 2016-03-26 DIAGNOSIS — E118 Type 2 diabetes mellitus with unspecified complications: Secondary | ICD-10-CM | POA: Diagnosis not present

## 2016-03-26 DIAGNOSIS — E049 Nontoxic goiter, unspecified: Secondary | ICD-10-CM | POA: Diagnosis not present

## 2016-03-26 DIAGNOSIS — I10 Essential (primary) hypertension: Secondary | ICD-10-CM | POA: Diagnosis not present

## 2016-03-26 DIAGNOSIS — E0590 Thyrotoxicosis, unspecified without thyrotoxic crisis or storm: Secondary | ICD-10-CM | POA: Diagnosis not present

## 2016-04-27 ENCOUNTER — Other Ambulatory Visit: Payer: Self-pay | Admitting: Family Medicine

## 2016-04-27 DIAGNOSIS — I10 Essential (primary) hypertension: Secondary | ICD-10-CM

## 2016-05-06 DIAGNOSIS — E119 Type 2 diabetes mellitus without complications: Secondary | ICD-10-CM | POA: Diagnosis not present

## 2016-05-06 DIAGNOSIS — B351 Tinea unguium: Secondary | ICD-10-CM | POA: Diagnosis not present

## 2016-05-06 DIAGNOSIS — D2370 Other benign neoplasm of skin of unspecified lower limb, including hip: Secondary | ICD-10-CM | POA: Diagnosis not present

## 2016-05-06 DIAGNOSIS — L851 Acquired keratosis [keratoderma] palmaris et plantaris: Secondary | ICD-10-CM | POA: Diagnosis not present

## 2016-05-26 DIAGNOSIS — E0590 Thyrotoxicosis, unspecified without thyrotoxic crisis or storm: Secondary | ICD-10-CM | POA: Diagnosis not present

## 2016-05-26 DIAGNOSIS — E049 Nontoxic goiter, unspecified: Secondary | ICD-10-CM | POA: Diagnosis not present

## 2016-05-29 ENCOUNTER — Other Ambulatory Visit: Payer: Self-pay | Admitting: Family Medicine

## 2016-06-02 DIAGNOSIS — D509 Iron deficiency anemia, unspecified: Secondary | ICD-10-CM | POA: Diagnosis not present

## 2016-06-02 DIAGNOSIS — L42 Pityriasis rosea: Secondary | ICD-10-CM | POA: Diagnosis not present

## 2016-06-02 DIAGNOSIS — E785 Hyperlipidemia, unspecified: Secondary | ICD-10-CM | POA: Diagnosis not present

## 2016-06-02 DIAGNOSIS — E118 Type 2 diabetes mellitus with unspecified complications: Secondary | ICD-10-CM | POA: Diagnosis not present

## 2016-06-02 DIAGNOSIS — E0590 Thyrotoxicosis, unspecified without thyrotoxic crisis or storm: Secondary | ICD-10-CM | POA: Diagnosis not present

## 2016-06-02 DIAGNOSIS — I10 Essential (primary) hypertension: Secondary | ICD-10-CM | POA: Diagnosis not present

## 2016-06-02 DIAGNOSIS — E049 Nontoxic goiter, unspecified: Secondary | ICD-10-CM | POA: Diagnosis not present

## 2016-06-02 LAB — TSH: TSH: 1.71 (ref <=5.90)

## 2016-06-29 ENCOUNTER — Encounter: Payer: Self-pay | Admitting: *Deleted

## 2016-06-30 ENCOUNTER — Encounter: Payer: Self-pay | Admitting: Certified Registered Nurse Anesthetist

## 2016-06-30 ENCOUNTER — Encounter
Admission: RE | Disposition: A | Discharge status: Home / Self Care | Payer: Self-pay | Source: Ambulatory Visit | Attending: Gastroenterology

## 2016-06-30 ENCOUNTER — Ambulatory Visit: Payer: Medicare HMO | Admitting: Certified Registered Nurse Anesthetist

## 2016-06-30 ENCOUNTER — Ambulatory Visit
Admission: RE | Admit: 2016-06-30 | Discharge: 2016-06-30 | Disposition: A | Discharge status: Home / Self Care | Payer: Medicare HMO | Source: Ambulatory Visit | Attending: Gastroenterology | Admitting: Gastroenterology

## 2016-06-30 DIAGNOSIS — Z79899 Other long term (current) drug therapy: Secondary | ICD-10-CM | POA: Insufficient documentation

## 2016-06-30 DIAGNOSIS — I10 Essential (primary) hypertension: Secondary | ICD-10-CM | POA: Insufficient documentation

## 2016-06-30 DIAGNOSIS — Z7982 Long term (current) use of aspirin: Secondary | ICD-10-CM | POA: Diagnosis not present

## 2016-06-30 DIAGNOSIS — E119 Type 2 diabetes mellitus without complications: Secondary | ICD-10-CM | POA: Diagnosis not present

## 2016-06-30 DIAGNOSIS — Z1211 Encounter for screening for malignant neoplasm of colon: Secondary | ICD-10-CM | POA: Insufficient documentation

## 2016-06-30 DIAGNOSIS — I2510 Atherosclerotic heart disease of native coronary artery without angina pectoris: Secondary | ICD-10-CM | POA: Insufficient documentation

## 2016-06-30 DIAGNOSIS — Q438 Other specified congenital malformations of intestine: Secondary | ICD-10-CM | POA: Insufficient documentation

## 2016-06-30 DIAGNOSIS — Z87891 Personal history of nicotine dependence: Secondary | ICD-10-CM | POA: Diagnosis not present

## 2016-06-30 DIAGNOSIS — I252 Old myocardial infarction: Secondary | ICD-10-CM | POA: Diagnosis not present

## 2016-06-30 DIAGNOSIS — E7800 Pure hypercholesterolemia, unspecified: Secondary | ICD-10-CM | POA: Diagnosis not present

## 2016-06-30 DIAGNOSIS — Z8371 Family history of colonic polyps: Secondary | ICD-10-CM | POA: Diagnosis not present

## 2016-06-30 DIAGNOSIS — Z7984 Long term (current) use of oral hypoglycemic drugs: Secondary | ICD-10-CM | POA: Diagnosis not present

## 2016-06-30 DIAGNOSIS — E0590 Thyrotoxicosis, unspecified without thyrotoxic crisis or storm: Secondary | ICD-10-CM | POA: Insufficient documentation

## 2016-06-30 HISTORY — PX: COLONOSCOPY WITH PROPOFOL: SHX5780

## 2016-06-30 LAB — HM COLONOSCOPY

## 2016-06-30 SURGERY — COLONOSCOPY WITH PROPOFOL
Anesthesia: General

## 2016-06-30 MED ORDER — SODIUM CHLORIDE 0.9 % IV SOLN
INTRAVENOUS | Status: DC
  Administered 2016-06-30: 11:00:00 via INTRAVENOUS

## 2016-06-30 MED ORDER — LIDOCAINE HCL 2 % IJ SOLN
INTRAMUSCULAR | Status: AC
  Filled 2016-06-30: qty 10

## 2016-06-30 MED ORDER — SODIUM CHLORIDE 0.9 % IV SOLN: INTRAVENOUS | Status: DC

## 2016-06-30 MED ORDER — PROPOFOL 10 MG/ML IV BOLUS
INTRAVENOUS | Status: DC | PRN
  Administered 2016-06-30: 20 mg via INTRAVENOUS
  Administered 2016-06-30: 30 mg via INTRAVENOUS
  Administered 2016-06-30: 20 mg via INTRAVENOUS
  Administered 2016-06-30: 30 mg via INTRAVENOUS

## 2016-06-30 MED ORDER — PROPOFOL 500 MG/50ML IV EMUL
INTRAVENOUS | Status: AC
  Filled 2016-06-30: qty 50

## 2016-06-30 MED ORDER — PROPOFOL 500 MG/50ML IV EMUL
INTRAVENOUS | Status: DC | PRN
  Administered 2016-06-30: 120 ug/kg/min via INTRAVENOUS

## 2016-06-30 MED ORDER — LIDOCAINE HCL (CARDIAC) 20 MG/ML IV SOLN
INTRAVENOUS | Status: DC | PRN
  Administered 2016-06-30: 30 mg via INTRAVENOUS

## 2016-06-30 NOTE — Anesthesia Procedure Notes (Signed)
Date/Time: 06/30/2016 11:00 AM Performed by: Johnna Acosta Pre-anesthesia Checklist: Patient identified, Emergency Drugs available, Suction available, Patient being monitored and Timeout performed Patient Re-evaluated:Patient Re-evaluated prior to inductionOxygen Delivery Method: Nasal cannula

## 2016-06-30 NOTE — Anesthesia Post-op Follow-up Note (Cosign Needed)
Anesthesia QCDR form completed.        

## 2016-06-30 NOTE — Anesthesia Preprocedure Evaluation (Signed)
Anesthesia Evaluation  Patient identified by MRN, date of birth, ID band Patient awake    Reviewed: Allergy & Precautions, NPO status , Patient's Chart, lab work & pertinent test results, reviewed documented beta blocker date and time   Airway Mallampati: II  TM Distance: >3 FB     Dental  (+) Chipped, Missing, Partial Lower   Pulmonary former smoker,           Cardiovascular hypertension, Pt. on medications and Pt. on home beta blockers + CAD and + Past MI       Neuro/Psych    GI/Hepatic   Endo/Other  diabetes, Type 2Hyperthyroidism   Renal/GU      Musculoskeletal  (+) Arthritis ,   Abdominal   Peds  Hematology  (+) anemia ,   Anesthesia Other Findings MI 10 yrs ago. Did not need stent.  Reproductive/Obstetrics                             Anesthesia Physical Anesthesia Plan  ASA: III  Anesthesia Plan: General   Post-op Pain Management:    Induction: Intravenous  Airway Management Planned:   Additional Equipment:   Intra-op Plan:   Post-operative Plan:   Informed Consent: I have reviewed the patients History and Physical, chart, labs and discussed the procedure including the risks, benefits and alternatives for the proposed anesthesia with the patient or authorized representative who has indicated his/her understanding and acceptance.     Plan Discussed with: CRNA  Anesthesia Plan Comments:         Anesthesia Quick Evaluation

## 2016-06-30 NOTE — Op Note (Signed)
[facility] Gastroenterology Patient Name: Vanessa Zamora Procedure Date: 06/30/2016 10:44 AM MRN: [id number] Account #: [id number] Date of Birth: October 25, 1940 Admit Type: Outpatient Age: 76 Room: [facility] ENDO ROOM 3 Gender: Female Note Status: Finalized Procedure:            Colonoscopy Indications:          Family history of colonic polyps in a first-degree                        relative Providers:            Lollie Sails, MD Referring MD:         Juline Patch, MD (Referring MD) Medicines:            Monitored Anesthesia Care Complications:        No immediate complications. Procedure:            Pre-Anesthesia Assessment:                       - ASA Grade Assessment: III - A patient with severe                        systemic disease.                       After obtaining informed consent, the colonoscope was                        passed under direct vision. Throughout the procedure,                        the patient's blood pressure, pulse, and oxygen                        saturations were monitored continuously. The Olympus                        PCF-H180AL colonoscope ( S#: [id number] ) was introduced                        through the anus and advanced to the the cecum,                        identified by appendiceal orifice and ileocecal valve.                        The colonoscopy was extremely difficult due to                        restricted mobility of the colon, significant looping                        and a tortuous colon. Successful completion of the                        procedure was aided by changing the patient to a supine                        position, changing the patient to a prone position and  withdrawing and reinserting the scope. The patient                        tolerated the procedure well. The quality of the bowel                        preparation was good. Findings:      The colon (entire examined  portion) was significantly redundant. The       scope was advanced to the proximal ascending, however I was unable to       fully intubate the cecum, however the appendiceal orifice and valve were       noted to be normal.      The retroflexed view of the distal rectum and anal verge was normal and       showed no anal or rectal abnormalities.      The digital rectal exam was normal.      The exam was otherwise without abnormality. Impression:           - Redundant colon.                       - The distal rectum and anal verge are normal on                        retroflexion view.                       - The examination was otherwise normal.                       - No specimens collected. Recommendation:       - Discharge patient to home.                       - Advance diet as tolerated. Procedure Code(s):    --- Professional ---                       [phone], Colonoscopy, flexible; diagnostic, including                        collection of specimen(s) by brushing or washing, when                        performed (separate procedure) Diagnosis Code(s):    --- Professional ---                       Z83.71, Family history of colonic polyps                       Q43.8, Other specified congenital malformations of                        intestine CPT copyright 2016 American Medical Association. All rights reserved. The codes documented in this report are preliminary and upon coder review may  be revised to meet current compliance requirements. Lollie Sails, MD 06/30/2016 11:57:38 AM This report has been signed electronically. Number of Addenda: 0 Note Initiated On: 06/30/2016 10:44 AM Scope Withdrawal Time: 0 hours 3 minutes 54 seconds  Total Procedure Duration: 0 hours 39 minutes 58 seconds  [facility]

## 2016-06-30 NOTE — Transfer of Care (Signed)
Immediate Anesthesia Transfer of Care Note  Patient: Vanessa Zamora  Procedure(s) Performed: Procedure(s): COLONOSCOPY WITH PROPOFOL (N/A)  Patient Location: PACU  Anesthesia Type:General  Level of Consciousness: sedated  Airway & Oxygen Therapy: Patient Spontanous Breathing and Patient connected to nasal cannula oxygen  Post-op Assessment: Report given to RN and Post -op Vital signs reviewed and stable  Post vital signs: Reviewed and stable  Last Vitals:  Vitals:   06/30/16 1024 06/30/16 1156  BP: (!) 152/81   Pulse: (!) 58   Resp: 16   Temp: 36.6 C 36.5 C    Last Pain:  Vitals:   06/30/16 1156  TempSrc: Tympanic         Complications: No apparent anesthesia complications

## 2016-06-30 NOTE — H&P (Signed)
Outpatient short stay form Pre-procedure 06/30/2016 10:38 AM Vanessa Sails MD  Primary Physician: Dr. Otilio Miu  Reason for visit:  Colonoscopy  History of present illness:  Patient is a 76 year old female presenting today as above. There is a family history of colon polyps and a primary relative however patient has not had polyps her self in the past. She tolerated her prep well. She takes no blood thinning agents. She does take 81 mg aspirin no other aspirin products. She held that today.    Current Facility-Administered Medications:  .  0.9 %  sodium chloride infusion, , Intravenous, Continuous, Vanessa Sails, MD .  0.9 %  sodium chloride infusion, , Intravenous, Continuous, Vanessa Sails, MD  Prescriptions Prior to Admission  Medication Sig Dispense Refill Last Dose  . aspirin 81 MG tablet Take 1 tablet by mouth daily.   06/29/2016 at Unknown time  . Calcium Carbonate-Vit D-Min (CALTRATE 600+D PLUS MINERALS) 600-800 MG-UNIT TABS Take 1 tablet by mouth daily.   Past Week at Unknown time  . carvedilol (COREG) 6.25 MG tablet Take 1 tablet (6.25 mg total) by mouth 2 (two) times daily. 180 tablet 1 06/30/2016 at Unknown time  . hydrochlorothiazide (HYDRODIURIL) 25 MG tablet Take 1 tablet (25 mg total) by mouth daily. 90 tablet 1 06/29/2016 at Unknown time  . MEGARED OMEGA-3 KRILL OIL 500 MG CAPS Take 1 capsule by mouth 2 (two) times daily. 60 capsule 11 Past Week at Unknown time  . metFORMIN (GLUCOPHAGE) 500 MG tablet TAKE 1 TABLET(500 MG) BY MOUTH TWICE DAILY 180 tablet 1 Past Week at Unknown time  . Multiple Vitamins-Minerals (CENTRUM SILVER ULTRA WOMENS) TABS Take 1 tablet by mouth daily.   Past Week at Unknown time  . olmesartan (BENICAR) 40 MG tablet Take 1 tablet (40 mg total) by mouth daily. AM 30 tablet 5 06/30/2016 at Unknown time  . pravastatin (PRAVACHOL) 40 MG tablet Take 1 tablet (40 mg total) by mouth daily. 90 tablet 1 06/29/2016 at Unknown time  . amoxicillin  (AMOXIL) 500 MG capsule Take 1 capsule (500 mg total) by mouth 3 (three) times daily. (Patient not taking: Reported on 06/30/2016) 30 capsule 0 Not Taking at Unknown time  . carvedilol (COREG) 6.25 MG tablet TAKE 1 TABLET BY MOUTH TWICE DAILY 180 tablet 0   . SYNTHROID 50 MCG tablet Take 1 tablet by mouth daily. Dr Truddie Coco   Not Taking at Unknown time     Allergies  Allergen Reactions  . Altace  [Ramipril] Anaphylaxis  . Ace Inhibitors Swelling  . Clonidine Swelling  . Losartan Potassium Other (See Comments)  . Synthroid [Levothyroxine]      Past Medical History:  Diagnosis Date  . Arthritis    shoulders, knees  . Coronary artery disease   . Diabetes mellitus without complication ([employer])   . Hypercholesteremia   . Hypertension   . Myocardial infarction ([place]) 2005  . Seasonal allergies   . Thyroid goiter   . Wears dentures    partial lower    Review of systems:      Physical Exam    Heart and lungs: Regular rate and rhythm without rub or gallop, lungs are bilaterally clear.    HEENT: Normocephalic atraumatic eyes are anicteric    Other:     Pertinant exam for procedure: Soft nontender nondistended bowel sounds positive normoactive.    Planned proceedures: Colonoscopy and indicated procedures. I have discussed the risks benefits and complications of procedures to include not  limited to bleeding, infection, perforation and the risk of sedation and the patient wishes to proceed.    Vanessa Sails, MD Gastroenterology 06/30/2016  10:38 AM

## 2016-06-30 NOTE — Anesthesia Postprocedure Evaluation (Signed)
Anesthesia Post Note  Patient: Vanessa Zamora  Procedure(s) Performed: Procedure(s) (LRB): COLONOSCOPY WITH PROPOFOL (N/A)  Patient location during evaluation: Endoscopy Anesthesia Type: General Level of consciousness: awake and alert Pain management: pain level controlled Vital Signs Assessment: post-procedure vital signs reviewed and stable Respiratory status: spontaneous breathing, nonlabored ventilation, respiratory function stable and patient connected to nasal cannula oxygen Cardiovascular status: blood pressure returned to baseline and stable Postop Assessment: no signs of nausea or vomiting Anesthetic complications: no     Last Vitals:  Vitals:   06/30/16 1236 06/30/16 1239  BP: (!) 143/69 (!) 143/69  Pulse: (!) 54 (!) 49  Resp: 15 13  Temp:      Last Pain:  Vitals:   06/30/16 1156  TempSrc: Tympanic                 Jihad Brownlow S

## 2016-07-01 LAB — GLUCOSE, CAPILLARY: Glucose-Capillary: 101 mg/dL — ABNORMAL HIGH (ref 65–99)

## 2016-07-02 ENCOUNTER — Encounter: Payer: Self-pay | Admitting: Gastroenterology

## 2016-07-08 DIAGNOSIS — H26492 Other secondary cataract, left eye: Secondary | ICD-10-CM | POA: Diagnosis not present

## 2016-07-25 ENCOUNTER — Other Ambulatory Visit: Payer: Self-pay | Admitting: Family Medicine

## 2016-07-25 DIAGNOSIS — I10 Essential (primary) hypertension: Secondary | ICD-10-CM

## 2016-07-28 ENCOUNTER — Other Ambulatory Visit: Payer: Self-pay

## 2016-07-28 ENCOUNTER — Ambulatory Visit (INDEPENDENT_AMBULATORY_CARE_PROVIDER_SITE_OTHER): Payer: Medicare HMO | Admitting: Family Medicine

## 2016-07-28 ENCOUNTER — Encounter: Payer: Self-pay | Admitting: Family Medicine

## 2016-07-28 DIAGNOSIS — E7800 Pure hypercholesterolemia, unspecified: Secondary | ICD-10-CM

## 2016-07-28 DIAGNOSIS — E785 Hyperlipidemia, unspecified: Secondary | ICD-10-CM

## 2016-07-28 DIAGNOSIS — I10 Essential (primary) hypertension: Secondary | ICD-10-CM

## 2016-07-28 DIAGNOSIS — E119 Type 2 diabetes mellitus without complications: Secondary | ICD-10-CM

## 2016-07-28 MED ORDER — METFORMIN HCL 500 MG PO TABS: ORAL_TABLET | ORAL | 1 refills | Status: DC

## 2016-07-28 MED ORDER — CARVEDILOL 6.25 MG PO TABS: 6.2500 mg | ORAL_TABLET | Freq: Two times a day (BID) | ORAL | 1 refills | Status: DC

## 2016-07-28 MED ORDER — PRAVASTATIN SODIUM 40 MG PO TABS: 40.0000 mg | ORAL_TABLET | Freq: Every day | ORAL | 1 refills | Status: DC

## 2016-07-28 MED ORDER — OLMESARTAN MEDOXOMIL 40 MG PO TABS: 40.0000 mg | ORAL_TABLET | Freq: Every day | ORAL | 5 refills | Status: DC

## 2016-07-28 MED ORDER — MEGARED OMEGA-3 KRILL OIL 500 MG PO CAPS: 1.0000 | ORAL_CAPSULE | Freq: Two times a day (BID) | ORAL | 11 refills | Status: DC

## 2016-07-28 MED ORDER — HYDROCHLOROTHIAZIDE 25 MG PO TABS: 25.0000 mg | ORAL_TABLET | Freq: Every day | ORAL | 1 refills | Status: DC

## 2016-07-28 NOTE — Progress Notes (Signed)
Name: Vanessa Zamora   MRN: [id number]    DOB: 01/12/1941   Date:07/28/2016       Progress Note  Subjective  Chief Complaint  Chief Complaint  Patient presents with  . Diabetes  . Hyperlipidemia  . Hypertension  . Hypothyroidism    wants to discuss thyroid - should she be on med or not    Diabetes  She presents for her follow-up diabetic visit. She has type 2 diabetes mellitus. Her disease course has been stable. Pertinent negatives for hypoglycemia include no confusion, dizziness, headaches, hunger, mood changes, nervousness/anxiousness, pallor, seizures, sleepiness, speech difficulty, sweats or tremors. Pertinent negatives for diabetes include no blurred vision, no chest pain, no fatigue, no foot paresthesias, no foot ulcerations, no polydipsia, no polyphagia, no polyuria, no visual change, no weakness and no weight loss. There are no hypoglycemic complications. Symptoms are stable. There are no diabetic complications. Pertinent negatives for diabetic complications include no CVA, PVD or retinopathy. Risk factors for coronary artery disease include diabetes mellitus, dyslipidemia and hypertension. Current diabetic treatment includes oral agent (monotherapy). She is compliant with treatment all of the time. Her weight is stable. She is following a generally healthy diet. She participates in exercise daily. Her breakfast blood glucose is taken between 7-8 am. Her breakfast blood glucose range is generally 90-110 mg/dl. An ACE inhibitor/angiotensin II receptor blocker is being taken. Eye exam is current.  Hyperlipidemia  This is a chronic problem. The current episode started more than 1 month ago. The problem is controlled. Recent lipid tests were reviewed and are normal. Exacerbating diseases include diabetes and hypothyroidism. She has no history of chronic renal disease. Factors aggravating her hyperlipidemia include thiazides. Pertinent negatives include no chest pain, focal weakness, myalgias or  shortness of breath. Current antihyperlipidemic treatment includes statins. The current treatment provides moderate improvement of lipids. Risk factors for coronary artery disease include diabetes mellitus, dyslipidemia and hypertension.  Hypertension  This is a chronic problem. The current episode started more than 1 month ago. The problem has been gradually improving since onset. The problem is controlled. Pertinent negatives include no blurred vision, chest pain, headaches, malaise/fatigue, neck pain, palpitations, shortness of breath or sweats. Risk factors for coronary artery disease include dyslipidemia and diabetes mellitus. Past treatments include ACE inhibitors, beta blockers and diuretics. The current treatment provides moderate improvement. There are no compliance problems.  There is no history of angina, kidney disease, CAD/MI, CVA, heart failure, PVD or retinopathy. Identifiable causes of hypertension include a thyroid problem. There is no history of chronic renal disease, a hypertension causing med or renovascular disease.  Thyroid Problem  Presents for follow-up visit. Symptoms include hair loss and leg swelling. Patient reports no anxiety, constipation, depressed mood, diaphoresis, diarrhea, dry skin, fatigue, heat intolerance, nail problem, palpitations, tremors, visual change or weight loss. The symptoms have been stable. Her past medical history is significant for diabetes and hyperlipidemia. There is no history of heart failure.    No problem-specific Assessment & Plan notes found for this encounter.   Past Medical History:  Diagnosis Date  . Arthritis    shoulders, knees  . Coronary artery disease   . Diabetes mellitus without complication ([place])   . Hypercholesteremia   . Hypertension   . Myocardial infarction ([place]) 2005  . Seasonal allergies   . Thyroid goiter   . Wears dentures    partial lower    Past Surgical History:  Procedure Laterality Date  . CARDIAC  CATHETERIZATION  2005  .  CATARACT EXTRACTION Left 10/2014  . CATARACT EXTRACTION W/PHACO Right 11/19/2014   Procedure: CATARACT EXTRACTION PHACO AND INTRAOCULAR LENS PLACEMENT ([place]);  Surgeon: Ronnell Freshwater, MD;  Location: [place];  Service: Ophthalmology;  Laterality: Right;  DIABETIC - oral meds  . COLONOSCOPY WITH PROPOFOL N/A 06/30/2016   Procedure: COLONOSCOPY WITH PROPOFOL;  Surgeon: Lollie Sails, MD;  Location: [facility] ENDOSCOPY;  Service: Endoscopy;  Laterality: N/A;  . EYE SURGERY    . VAGINAL HYSTERECTOMY  02/10/1980    Family History  Problem Relation Age of Onset  . Breast cancer Neg Hx     Social History   Social History  . Marital status: Single    Spouse name: N/A  . Number of children: N/A  . Years of education: N/A   Occupational History  . Not on file.   Social History Main Topics  . Smoking status: Former Research scientist (life sciences)  . Smokeless tobacco: Never Used     Comment: quit 2003  . Alcohol use No  . Drug use: No  . Sexual activity: Not Currently   Other Topics Concern  . Not on file   Social History Narrative  . No narrative on file    Allergies  Allergen Reactions  . Altace  [Ramipril] Anaphylaxis  . Ace Inhibitors Swelling  . Clonidine Swelling  . Losartan Potassium Other (See Comments)  . Synthroid [Levothyroxine]     Outpatient Medications Prior to Visit  Medication Sig Dispense Refill  . aspirin 81 MG tablet Take 1 tablet by mouth daily.    . Calcium Carbonate-Vit D-Min (CALTRATE 600+D PLUS MINERALS) 600-800 MG-UNIT TABS Take 1 tablet by mouth daily.    . Multiple Vitamins-Minerals (CENTRUM SILVER ULTRA WOMENS) TABS Take 1 tablet by mouth daily.    . carvedilol (COREG) 6.25 MG tablet Take 1 tablet (6.25 mg total) by mouth 2 (two) times daily. 180 tablet 1  . hydrochlorothiazide (HYDRODIURIL) 25 MG tablet Take 1 tablet (25 mg total) by mouth daily. 90 tablet 1  . MEGARED OMEGA-3 KRILL OIL 500 MG CAPS Take 1 capsule by mouth 2  (two) times daily. 60 capsule 11  . metFORMIN (GLUCOPHAGE) 500 MG tablet TAKE 1 TABLET(500 MG) BY MOUTH TWICE DAILY 180 tablet 1  . olmesartan (BENICAR) 40 MG tablet Take 1 tablet (40 mg total) by mouth daily. AM 30 tablet 5  . pravastatin (PRAVACHOL) 40 MG tablet Take 1 tablet (40 mg total) by mouth daily. 90 tablet 1  . amoxicillin (AMOXIL) 500 MG capsule Take 1 capsule (500 mg total) by mouth 3 (three) times daily. (Patient not taking: Reported on 06/30/2016) 30 capsule 0  . carvedilol (COREG) 6.25 MG tablet TAKE 1 TABLET BY MOUTH TWICE DAILY 180 tablet 0  . SYNTHROID 50 MCG tablet Take 1 tablet by mouth daily. Dr Truddie Coco     No facility-administered medications prior to visit.     Review of Systems  Constitutional: Negative for chills, diaphoresis, fatigue, fever, malaise/fatigue and weight loss.  HENT: Negative for ear discharge, ear pain and sore throat.   Eyes: Negative for blurred vision.  Respiratory: Negative for cough, sputum production, shortness of breath and wheezing.   Cardiovascular: Negative for chest pain, palpitations and leg swelling.  Gastrointestinal: Negative for abdominal pain, blood in stool, constipation, diarrhea, heartburn, melena and nausea.  Genitourinary: Negative for dysuria, frequency, hematuria and urgency.  Musculoskeletal: Negative for back pain, joint pain, myalgias and neck pain.  Skin: Negative for pallor and rash.  Neurological: Negative for  dizziness, tingling, tremors, sensory change, focal weakness, seizures, speech difficulty, weakness and headaches.  Endo/Heme/Allergies: Negative for environmental allergies, heat intolerance, polydipsia and polyphagia. Does not bruise/bleed easily.  Psychiatric/Behavioral: Negative for confusion, depression and suicidal ideas. The patient is not nervous/anxious and does not have insomnia.      Objective  Vitals:   07/28/16 1018  BP: 120/80  Pulse: 64  Weight: 132 lb (59.9 kg)  Height: 5\' 5"  (1.651 m)     Physical Exam  Constitutional: She is well-developed, well-nourished, and in no distress. No distress.  HENT:  Head: Normocephalic and atraumatic.  Right Ear: External ear normal.  Left Ear: External ear normal.  Nose: Nose normal.  Mouth/Throat: Oropharynx is clear and moist.  Eyes: Conjunctivae and EOM are normal. Pupils are equal, round, and reactive to light. Right eye exhibits no discharge. Left eye exhibits no discharge.  Neck: Normal range of motion. Neck supple. No JVD present. No thyromegaly present.  Cardiovascular: Normal rate, regular rhythm, normal heart sounds and intact distal pulses.  Exam reveals no gallop and no friction rub.   No murmur heard. Pulmonary/Chest: Effort normal and breath sounds normal. She has no wheezes. She has no rales. She exhibits no tenderness.  Abdominal: Soft. Bowel sounds are normal. She exhibits no mass. There is no tenderness. There is no guarding.  Musculoskeletal: Normal range of motion. She exhibits no edema.  Lymphadenopathy:    She has no cervical adenopathy.  Neurological: She is alert. She has normal reflexes.  Skin: Skin is warm and dry. She is not diaphoretic.  Psychiatric: Mood and affect normal.  Nursing note and vitals reviewed.     Assessment & Plan  Problem List Items Addressed This Visit      Cardiovascular and Mediastinum   Essential (primary) hypertension   Relevant Medications   carvedilol (COREG) 6.25 MG tablet   hydrochlorothiazide (HYDRODIURIL) 25 MG tablet   olmesartan (BENICAR) 40 MG tablet   pravastatin (PRAVACHOL) 40 MG tablet   Other Relevant Orders   Renal Function Panel     Endocrine   Diabetes mellitus with no complication (HCC)   Relevant Medications   olmesartan (BENICAR) 40 MG tablet   pravastatin (PRAVACHOL) 40 MG tablet   metFORMIN (GLUCOPHAGE) 500 MG tablet     Other   Hypercholesteremia   Relevant Medications   carvedilol (COREG) 6.25 MG tablet   hydrochlorothiazide (HYDRODIURIL) 25  MG tablet   olmesartan (BENICAR) 40 MG tablet   pravastatin (PRAVACHOL) 40 MG tablet   MEGARED OMEGA-3 KRILL OIL 500 MG CAPS   Dyslipidemia   Relevant Medications   pravastatin (PRAVACHOL) 40 MG tablet   Other Relevant Orders   Lipid Profile      Meds ordered this encounter  Medications  . carvedilol (COREG) 6.25 MG tablet    Sig: Take 1 tablet (6.25 mg total) by mouth 2 (two) times daily.    Dispense:  180 tablet    Refill:  1    Needs appt for this month  . hydrochlorothiazide (HYDRODIURIL) 25 MG tablet    Sig: Take 1 tablet (25 mg total) by mouth daily.    Dispense:  90 tablet    Refill:  1    **Patient requests 90 days supply**  . olmesartan (BENICAR) 40 MG tablet    Sig: Take 1 tablet (40 mg total) by mouth daily. AM    Dispense:  30 tablet    Refill:  5    Pt says she is able to  take  . pravastatin (PRAVACHOL) 40 MG tablet    Sig: Take 1 tablet (40 mg total) by mouth daily.    Dispense:  90 tablet    Refill:  1  . MEGARED OMEGA-3 KRILL OIL 500 MG CAPS    Sig: Take 1 capsule by mouth 2 (two) times daily.    Dispense:  60 capsule    Refill:  11  . metFORMIN (GLUCOPHAGE) 500 MG tablet    Sig: TAKE 1 TABLET(500 MG) BY MOUTH TWICE DAILY    Dispense:  180 tablet    Refill:  1    sched appt for May      Dr. Macon [facility]  07/28/16

## 2016-07-29 ENCOUNTER — Other Ambulatory Visit: Payer: Self-pay

## 2016-07-29 LAB — RENAL FUNCTION PANEL
Albumin: 4.5 g/dL (ref 3.5–4.8)
BUN/Creatinine Ratio: 24 (ref 12–28)
BUN: 18 mg/dL (ref 8–27)
CHLORIDE: 103 mmol/L (ref 96–106)
CO2: 26 mmol/L (ref 20–29)
Calcium: 9.3 mg/dL (ref 8.7–10.3)
Creatinine, Ser: 0.75 mg/dL (ref 0.57–1.00)
GFR, EST AFRICAN AMERICAN: 89 mL/min/{1.73_m2} (ref >59)
GFR, EST NON AFRICAN AMERICAN: 77 mL/min/{1.73_m2} (ref >59)
GLUCOSE: 97 mg/dL (ref 65–99)
POTASSIUM: 4.6 mmol/L (ref 3.5–5.2)
Phosphorus: 3.5 mg/dL (ref 2.5–4.5)
SODIUM: 142 mmol/L (ref 134–144)

## 2016-07-29 LAB — LIPID PANEL
CHOL/HDL RATIO: 2 ratio (ref 0.0–4.4)
Cholesterol, Total: 183 mg/dL (ref 100–199)
HDL: 92 mg/dL (ref >39)
LDL Calculated: 79 mg/dL (ref 0–99)
Triglycerides: 59 mg/dL (ref 0–149)
VLDL Cholesterol Cal: 12 mg/dL (ref 5–40)

## 2016-08-13 ENCOUNTER — Other Ambulatory Visit: Payer: Self-pay

## 2016-08-13 DIAGNOSIS — M5417 Radiculopathy, lumbosacral region: Secondary | ICD-10-CM | POA: Diagnosis not present

## 2016-08-13 DIAGNOSIS — E119 Type 2 diabetes mellitus without complications: Secondary | ICD-10-CM

## 2016-08-13 DIAGNOSIS — M5126 Other intervertebral disc displacement, lumbar region: Secondary | ICD-10-CM | POA: Diagnosis not present

## 2016-08-13 DIAGNOSIS — R69 Illness, unspecified: Secondary | ICD-10-CM | POA: Diagnosis not present

## 2016-08-13 MED ORDER — GLUCOSE BLOOD VI STRP: ORAL_STRIP | 1 refills | Status: DC

## 2016-08-18 DIAGNOSIS — I10 Essential (primary) hypertension: Secondary | ICD-10-CM | POA: Diagnosis not present

## 2016-08-18 DIAGNOSIS — E079 Disorder of thyroid, unspecified: Secondary | ICD-10-CM | POA: Diagnosis not present

## 2016-08-18 DIAGNOSIS — E784 Other hyperlipidemia: Secondary | ICD-10-CM | POA: Diagnosis not present

## 2016-08-18 DIAGNOSIS — R011 Cardiac murmur, unspecified: Secondary | ICD-10-CM | POA: Diagnosis not present

## 2016-08-18 DIAGNOSIS — E119 Type 2 diabetes mellitus without complications: Secondary | ICD-10-CM | POA: Diagnosis not present

## 2016-08-18 DIAGNOSIS — M1990 Unspecified osteoarthritis, unspecified site: Secondary | ICD-10-CM | POA: Diagnosis not present

## 2016-08-18 DIAGNOSIS — I2510 Atherosclerotic heart disease of native coronary artery without angina pectoris: Secondary | ICD-10-CM | POA: Diagnosis not present

## 2016-08-18 DIAGNOSIS — I429 Cardiomyopathy, unspecified: Secondary | ICD-10-CM | POA: Diagnosis not present

## 2016-08-26 DIAGNOSIS — K5900 Constipation, unspecified: Secondary | ICD-10-CM | POA: Diagnosis not present

## 2016-08-31 DIAGNOSIS — L851 Acquired keratosis [keratoderma] palmaris et plantaris: Secondary | ICD-10-CM | POA: Diagnosis not present

## 2016-08-31 DIAGNOSIS — E119 Type 2 diabetes mellitus without complications: Secondary | ICD-10-CM | POA: Diagnosis not present

## 2016-08-31 DIAGNOSIS — B351 Tinea unguium: Secondary | ICD-10-CM | POA: Diagnosis not present

## 2016-08-31 DIAGNOSIS — D2370 Other benign neoplasm of skin of unspecified lower limb, including hip: Secondary | ICD-10-CM | POA: Diagnosis not present

## 2016-09-04 DIAGNOSIS — H04123 Dry eye syndrome of bilateral lacrimal glands: Secondary | ICD-10-CM | POA: Diagnosis not present

## 2016-11-24 ENCOUNTER — Ambulatory Visit (INDEPENDENT_AMBULATORY_CARE_PROVIDER_SITE_OTHER): Payer: Medicare HMO

## 2016-11-24 DIAGNOSIS — Z23 Encounter for immunization: Secondary | ICD-10-CM

## 2016-11-30 DIAGNOSIS — E049 Nontoxic goiter, unspecified: Secondary | ICD-10-CM | POA: Diagnosis not present

## 2016-12-07 DIAGNOSIS — L42 Pityriasis rosea: Secondary | ICD-10-CM | POA: Diagnosis not present

## 2016-12-07 DIAGNOSIS — E118 Type 2 diabetes mellitus with unspecified complications: Secondary | ICD-10-CM | POA: Diagnosis not present

## 2016-12-07 DIAGNOSIS — I10 Essential (primary) hypertension: Secondary | ICD-10-CM | POA: Diagnosis not present

## 2016-12-07 DIAGNOSIS — D509 Iron deficiency anemia, unspecified: Secondary | ICD-10-CM | POA: Diagnosis not present

## 2016-12-07 DIAGNOSIS — E0590 Thyrotoxicosis, unspecified without thyrotoxic crisis or storm: Secondary | ICD-10-CM | POA: Diagnosis not present

## 2016-12-07 DIAGNOSIS — E785 Hyperlipidemia, unspecified: Secondary | ICD-10-CM | POA: Diagnosis not present

## 2016-12-07 DIAGNOSIS — E049 Nontoxic goiter, unspecified: Secondary | ICD-10-CM | POA: Diagnosis not present

## 2017-01-05 ENCOUNTER — Other Ambulatory Visit: Payer: Self-pay | Admitting: Family Medicine

## 2017-01-05 DIAGNOSIS — Z1231 Encounter for screening mammogram for malignant neoplasm of breast: Secondary | ICD-10-CM

## 2017-01-05 DIAGNOSIS — R69 Illness, unspecified: Secondary | ICD-10-CM | POA: Diagnosis not present

## 2017-01-06 DIAGNOSIS — B351 Tinea unguium: Secondary | ICD-10-CM | POA: Diagnosis not present

## 2017-01-06 DIAGNOSIS — L851 Acquired keratosis [keratoderma] palmaris et plantaris: Secondary | ICD-10-CM | POA: Diagnosis not present

## 2017-01-06 DIAGNOSIS — E119 Type 2 diabetes mellitus without complications: Secondary | ICD-10-CM | POA: Diagnosis not present

## 2017-01-08 DIAGNOSIS — G8929 Other chronic pain: Secondary | ICD-10-CM | POA: Diagnosis not present

## 2017-01-08 DIAGNOSIS — E118 Type 2 diabetes mellitus with unspecified complications: Secondary | ICD-10-CM | POA: Diagnosis not present

## 2017-01-08 DIAGNOSIS — M25511 Pain in right shoulder: Secondary | ICD-10-CM | POA: Diagnosis not present

## 2017-01-08 DIAGNOSIS — M19011 Primary osteoarthritis, right shoulder: Secondary | ICD-10-CM | POA: Diagnosis not present

## 2017-01-14 ENCOUNTER — Other Ambulatory Visit: Payer: Self-pay | Admitting: Family Medicine

## 2017-01-14 DIAGNOSIS — I10 Essential (primary) hypertension: Secondary | ICD-10-CM

## 2017-01-15 ENCOUNTER — Other Ambulatory Visit: Payer: Self-pay | Admitting: Family Medicine

## 2017-01-15 DIAGNOSIS — I10 Essential (primary) hypertension: Secondary | ICD-10-CM

## 2017-01-21 ENCOUNTER — Ambulatory Visit (INDEPENDENT_AMBULATORY_CARE_PROVIDER_SITE_OTHER): Payer: Medicare HMO

## 2017-01-21 VITALS — BP 110/64 | HR 68 | Temp 98.0°F | Resp 16 | Ht 65.0 in | Wt 132.6 lb

## 2017-01-21 DIAGNOSIS — Z0000 Encounter for general adult medical examination without abnormal findings: Secondary | ICD-10-CM | POA: Diagnosis not present

## 2017-01-21 NOTE — Patient Instructions (Signed)
[name] , Thank you for taking time to come for your Medicare Wellness Visit. I appreciate your ongoing commitment to your health goals. Please review the following plan we discussed and let me know if I can assist you in the future.   Screening recommendations/referrals: Colonoscopy: Completed 06/30/16. Repeat colonoscopy every 10 years. Given age, colon cancer screenings no longer required. Mammogram: Completed 02/05/16. Scheduled for annual repeat mammogram on 02/08/17 @ 2:20pm @ [facility] Bone Density: Completed 06/06/12. Report revealed osteopenia. Repeat osteoporotic screenings no longer required Recommended yearly ophthalmology/optometry visit for glaucoma screening and checkup. Last completed 01/11/16. Please call your ophthalmologist to schedule your annual diabetic eye exam. Recommended yearly dental visit for hygiene and checkup  Vaccinations: Influenza vaccine: Completed 11/24/16 Pneumococcal vaccine: Completed PCV13 12/18/14 and PPSV23 05/13/12 Tdap vaccine: Completed 01/28/16 Shingles vaccine: Completed Zostavax 01/09/13. Declined Shingrix. Please call your insurance company to determine your out of pocket expense. You may also receive this vaccine at your local pharmacy or Health Dept.  Advanced directives: Please bring a copy of your POA (Power of Attorney) and/or Living Will to your next appointment.   Conditions/risks identified: Fall risk prevention discussed  Next appointment: You are scheduled to see Dr. Ronnald Ramp on 01/26/18 @ 10:15am.  Please schedule your Annual Wellness Visit with your Nurse Health Advisor in one year.  Preventive Care 72 Years and Older, Female Preventive care refers to lifestyle choices and visits with your health care provider that can promote health and wellness. What does preventive care include?  A yearly physical exam. This is also called an annual well check.  Dental exams once or twice a year.  Routine eye exams. Ask your health care provider how  often you should have your eyes checked.  Personal lifestyle choices, including:  Daily care of your teeth and gums.  Regular physical activity.  Eating a healthy diet.  Avoiding tobacco and drug use.  Limiting alcohol use.  Practicing safe sex.  Taking low-dose aspirin every day.  Taking vitamin and mineral supplements as recommended by your health care provider. What happens during an annual well check? The services and screenings done by your health care provider during your annual well check will depend on your age, overall health, lifestyle risk factors, and family history of disease. Counseling  Your health care provider may ask you questions about your:  Alcohol use.  Tobacco use.  Drug use.  Emotional well-being.  Home and relationship well-being.  Sexual activity.  Eating habits.  History of falls.  Memory and ability to understand (cognition).  Work and work Statistician.  Reproductive health. Screening  You may have the following tests or measurements:  Height, weight, and BMI.  Blood pressure.  Lipid and cholesterol levels. These may be checked every 5 years, or more frequently if you are over 58 years old.  Skin check.  Lung cancer screening. You may have this screening every year starting at age 25 if you have a 30-pack-year history of smoking and currently smoke or have quit within the past 15 years.  Fecal occult blood test (FOBT) of the stool. You may have this test every year starting at age 93.  Flexible sigmoidoscopy or colonoscopy. You may have a sigmoidoscopy every 5 years or a colonoscopy every 10 years starting at age 40.  Hepatitis C blood test.  Hepatitis B blood test.  Sexually transmitted disease (STD) testing.  Diabetes screening. This is done by checking your blood sugar (glucose) after you have not eaten for a  while (fasting). You may have this done every 1-3 years.  Bone density scan. This is done to screen for  osteoporosis. You may have this done starting at age 35.  Mammogram. This may be done every 1-2 years. Talk to your health care provider about how often you should have regular mammograms. Talk with your health care provider about your test results, treatment options, and if necessary, the need for more tests. Vaccines  Your health care provider may recommend certain vaccines, such as:  Influenza vaccine. This is recommended every year.  Tetanus, diphtheria, and acellular pertussis (Tdap, Td) vaccine. You may need a Td booster every 10 years.  Zoster vaccine. You may need this after age 39.  Pneumococcal 13-valent conjugate (PCV13) vaccine. One dose is recommended after age 9.  Pneumococcal polysaccharide (PPSV23) vaccine. One dose is recommended after age 29. Talk to your health care provider about which screenings and vaccines you need and how often you need them. This information is not intended to replace advice given to you by your health care provider. Make sure you discuss any questions you have with your health care provider. Document Released: 02/22/2015 Document Revised: 10/16/2015 Document Reviewed: 11/27/2014 Elsevier Interactive Patient Education  2017 [place] Prevention in the Home Falls can cause injuries. They can happen to people of all ages. There are many things you can do to make your home safe and to help prevent falls. What can I do on the outside of my home?  Regularly fix the edges of walkways and driveways and fix any cracks.  Remove anything that might make you trip as you walk through a door, such as a raised step or threshold.  Trim any bushes or trees on the path to your home.  Use bright outdoor lighting.  Clear any walking paths of anything that might make someone trip, such as rocks or tools.  Regularly check to see if handrails are loose or broken. Make sure that both sides of any steps have handrails.  Any raised decks and porches  should have guardrails on the edges.  Have any leaves, snow, or ice cleared regularly.  Use sand or salt on walking paths during winter.  Clean up any spills in your garage right away. This includes oil or grease spills. What can I do in the bathroom?  Use night lights.  Install grab bars by the toilet and in the tub and shower. Do not use towel bars as grab bars.  Use non-skid mats or decals in the tub or shower.  If you need to sit down in the shower, use a plastic, non-slip stool.  Keep the floor dry. Clean up any water that spills on the floor as soon as it happens.  Remove soap buildup in the tub or shower regularly.  Attach bath mats securely with double-sided non-slip rug tape.  Do not have throw rugs and other things on the floor that can make you trip. What can I do in the bedroom?  Use night lights.  Make sure that you have a light by your bed that is easy to reach.  Do not use any sheets or blankets that are too big for your bed. They should not hang down onto the floor.  Have a firm chair that has side arms. You can use this for support while you get dressed.  Do not have throw rugs and other things on the floor that can make you trip. What can I do in the  kitchen?  Clean up any spills right away.  Avoid walking on wet floors.  Keep items that you use a lot in easy-to-reach places.  If you need to reach something above you, use a strong step stool that has a grab bar.  Keep electrical cords out of the way.  Do not use floor polish or wax that makes floors slippery. If you must use wax, use non-skid floor wax.  Do not have throw rugs and other things on the floor that can make you trip. What can I do with my stairs?  Do not leave any items on the stairs.  Make sure that there are handrails on both sides of the stairs and use them. Fix handrails that are broken or loose. Make sure that handrails are as long as the stairways.  Check any carpeting to  make sure that it is firmly attached to the stairs. Fix any carpet that is loose or worn.  Avoid having throw rugs at the top or bottom of the stairs. If you do have throw rugs, attach them to the floor with carpet tape.  Make sure that you have a light switch at the top of the stairs and the bottom of the stairs. If you do not have them, ask someone to add them for you. What else can I do to help prevent falls?  Wear shoes that:  Do not have high heels.  Have rubber bottoms.  Are comfortable and fit you well.  Are closed at the toe. Do not wear sandals.  If you use a stepladder:  Make sure that it is fully opened. Do not climb a closed stepladder.  Make sure that both sides of the stepladder are locked into place.  Ask someone to hold it for you, if possible.  Clearly mark and make sure that you can see:  Any grab bars or handrails.  First and last steps.  Where the edge of each step is.  Use tools that help you move around (mobility aids) if they are needed. These include:  Canes.  Walkers.  Scooters.  Crutches.  Turn on the lights when you go into a dark area. Replace any light bulbs as soon as they burn out.  Set up your furniture so you have a clear path. Avoid moving your furniture around.  If any of your floors are uneven, fix them.  If there are any pets around you, be aware of where they are.  Review your medicines with your doctor. Some medicines can make you feel dizzy. This can increase your chance of falling. Ask your doctor what other things that you can do to help prevent falls. This information is not intended to replace advice given to you by your health care provider. Make sure you discuss any questions you have with your health care provider. Document Released: 11/22/2008 Document Revised: 07/04/2015 Document Reviewed: 03/02/2014 Elsevier Interactive Patient Education  2017 [employer].

## 2017-01-21 NOTE — Progress Notes (Signed)
Subjective:   Vanessa Zamora is a 76 y.o. female who presents for Medicare Annual (Subsequent) preventive examination.  Review of Systems:  N/A Cardiac Risk Factors include: advanced age (>58men, >35 women);diabetes mellitus;dyslipidemia;family history of premature cardiovascular disease;hypertension     Objective:     Vitals: BP 110/64 (BP Location: Left Arm, Patient Position: Sitting, Cuff Size: Normal)   Pulse 68   Temp 98 F (36.7 C) (Oral)   Resp 16   Ht 5\' 5"  (1.651 m)   Wt 132 lb 9.6 oz (60.1 kg)   BMI 22.07 kg/m   Body mass index is 22.07 kg/m.  Advanced Directives 01/21/2017 06/30/2016 12/06/2014 11/19/2014  Does Patient Have a Medical Advance Directive? Yes Yes Yes No  Type of Paramedic of [place];Living will Living will Halfway in Chart? No - copy requested - No - copy requested -  Would patient like information on creating a medical advance directive? - - - Yes - Educational materials given    Tobacco Social History   Tobacco Use  Smoking Status Former Smoker  . Packs/day: 0.25  . Years: 30.00  . Pack years: 7.50  . Types: Cigarettes  . Last attempt to quit: 2003  . Years since quitting: 15.9  Smokeless Tobacco Never Used  Tobacco Comment   quit 2003     Counseling given: Not Answered Comment: quit 2003   Clinical Intake:  Pre-visit preparation completed: Yes  Pain : No/denies pain     BMI - recorded: 22.07 Nutritional Status: BMI of 19-24  Normal Nutritional Risks: None Diabetes: Yes CBG done?: No Did pt. bring in CBG monitor from home?: No  Activities of Daily Living: Independent Ambulation: Independent with device- listed below Home Assistive Devices/Equipment: Eyeglasses, Dentures (specify type)(lower partial dentures) Medication Administration: Independent Home Management: Independent  Barriers to Care Management & Learning: None  Do you  feel unsafe in your current relationship?: No(single) Do you feel physically threatened by others?: No Anyone hurting you at home, work, or school?: No  How often do you need to have someone help you when you read instructions, pamphlets, or other written materials from your doctor or pharmacy?: 1 - Never What is the last grade level you completed in school?: some college  Interpreter Needed?: No  Information entered by :: Idell Pickles, LPN  Past Medical History:  Diagnosis Date  . Arthritis    shoulders, knees  . Coronary artery disease   . Diabetes mellitus without complication ([place])   . Hypercholesteremia   . Hypertension   . Myocardial infarction ([place]) 2005  . Seasonal allergies   . Thyroid goiter   . Wears dentures    partial lower   Past Surgical History:  Procedure Laterality Date  . CARDIAC CATHETERIZATION  2005  . CATARACT EXTRACTION Left 10/2014  . CATARACT EXTRACTION W/PHACO Right 11/19/2014   Procedure: CATARACT EXTRACTION PHACO AND INTRAOCULAR LENS PLACEMENT ([place]);  Surgeon: Ronnell Freshwater, MD;  Location: [facility];  Service: Ophthalmology;  Laterality: Right;  DIABETIC - oral meds  . COLONOSCOPY WITH PROPOFOL N/A 06/30/2016   Procedure: COLONOSCOPY WITH PROPOFOL;  Surgeon: Lollie Sails, MD;  Location: [facility] END[facility]COPY;  Service: Endoscopy;  Laterality: N/A;  . EYE SURGERY    . VAGINAL HYSTERECTOMY  02/10/1980   Family History  Problem Relation Age of Onset  . Stroke Mother   . Aneurysm Sister   . Heart disease Brother   .  Hypertension Brother   . Goiter Maternal Grandmother   . Diabetes Maternal Grandmother   . COPD Brother   . Hypertension Brother   . Breast cancer Neg Hx    Social History   Socioeconomic History  . Marital status: Single    Spouse name: None  . Number of children: 1  . Years of education: None  . Highest education level: None  Social Needs  . Financial resource strain: Not hard at all  . Food insecurity -  worry: Never true  . Food insecurity - inability: Never true  . Transportation needs - medical: No  . Transportation needs - non-medical: No  Occupational History  . Occupation: Retired  Tobacco Use  . Smoking status: Former Smoker    Packs/day: 0.25    Years: 30.00    Pack years: 7.50    Types: Cigarettes    Last attempt to quit: 2003    Years since quitting: 15.9  . Smokeless tobacco: Never Used  . Tobacco comment: quit 2003  Substance and Sexual Activity  . Alcohol use: No    Alcohol/week: 0.0 oz  . Drug use: No  . Sexual activity: Not Currently  Other Topics Concern  . None  Social History Narrative  . None    Outpatient Encounter Medications as of 01/21/2017  Medication Sig  . aspirin 81 MG tablet Take 1 tablet by mouth daily.  . Calcium Carbonate-Vit D-Min (CALTRATE 600+D PLUS MINERALS) 600-800 MG-UNIT TABS Take 1 tablet by mouth daily.  . carvedilol (COREG) 6.25 MG tablet Take 1 tablet (6.25 mg total) by mouth 2 (two) times daily.  . ferrous sulfate 325 (65 FE) MG tablet Take 325 mg by mouth every other day.  Marland Kitchen glucose blood test strip Use as instructed  . hydrochlorothiazide (HYDRODIURIL) 25 MG tablet Take 1 tablet (25 mg total) by mouth daily.  Marland Kitchen MEGARED OMEGA-3 KRILL OIL 500 MG CAPS Take 1 capsule by mouth 2 (two) times daily.  . metFORMIN (GLUCOPHAGE) 500 MG tablet TAKE 1 TABLET(500 MG) BY MOUTH TWICE DAILY  . Multiple Vitamins-Minerals (CENTRUM SILVER ULTRA WOMENS) TABS Take 1 tablet by mouth daily.  Marland Kitchen olmesartan (BENICAR) 40 MG tablet Take 1 tablet (40 mg total) by mouth daily. AM  . pravastatin (PRAVACHOL) 40 MG tablet Take 1 tablet (40 mg total) by mouth daily.  . [DISCONTINUED] carvedilol (COREG) 6.25 MG tablet TAKE 1 TABLET BY MOUTH TWICE DAILY  . [DISCONTINUED] olmesartan (BENICAR) 40 MG tablet TAKE 1 TABLET BY MOUTH EVERY MORNING   No facility-administered encounter medications on file as of 01/21/2017.     Activities of Daily Living In your present  state of health, do you have any difficulty performing the following activities: 01/21/2017  Hearing? N  Vision? N  Difficulty concentrating or making decisions? Y  Comment short term memory loss  Walking or climbing stairs? N  Dressing or bathing? N  Doing errands, shopping? N  Preparing Food and eating ? N  Using the Toilet? N  In the past six months, have you accidently leaked urine? N  Do you have problems with loss of bowel control? N  Managing your Medications? N  Managing your Finances? N  Housekeeping or managing your Housekeeping? N  Some recent data might be hidden    Timed Get Up and Go performed: Yes. 10 Sec. No intervention required at this time.  Patient Care Team: Juline Patch, MD as PCP - General (Family Medicine) Ronnald Collum, Lourdes Sledge, MD as Consulting Physician (  Endocrinology) Yolonda Kida, MD as Consulting Physician (Cardiology)    Assessment:    Exercise Activities and Dietary recommendations Current Exercise Habits: Home exercise routine, Type of exercise: walking, Time (Minutes): 60, Frequency (Times/Week): 5, Weekly Exercise (Minutes/Week): 300, Intensity: Mild, Exercise limited by: None identified  Goals    . Prevent falls     Recommend to remove items from the home that may cause slips or trips      Fall Risk Fall Risk  01/21/2017 07/28/2016 06/18/2015 12/18/2014 12/06/2014  Falls in the past year? No No No No No   Is the patient's home free of loose throw rugs in walkways, pet beds, electrical cords, etc?   yes      Grab bars in the bathroom? yes      Handrails on the stairs?   yes      Adequate lighting?   yes  Depression Screen PHQ 2/9 Scores 01/21/2017 07/28/2016 06/18/2015 12/18/2014  PHQ - 2 Score 0 0 0 0     Cognitive Function     6CIT Screen 01/21/2017  What Year? 0 points  What month? 0 points  What time? 0 points  Count back from 20 0 points  Months in reverse 0 points  Repeat phrase 0 points  Total Score 0     Immunization History  Administered Date(s) Administered  . Influenza, High Dose Seasonal PF 11/24/2016  . Influenza,inj,Quad PF,6+ Mos 12/06/2014, 11/12/2015  . Influenza-Unspecified 11/13/2013  . Pneumococcal Conjugate-13 12/18/2014  . Pneumococcal Polysaccharide-23 05/13/2012  . Tdap 01/28/2016  . Zoster 01/09/2013   Screening Tests Health Maintenance  Topic Date Due  . HEMOGLOBIN A1C  06/17/2016  . OPHTHALMOLOGY EXAM  01/10/2017  . FOOT EXAM  01/27/2017  . MAMMOGRAM  02/04/2017  . TETANUS/TDAP  01/27/2026  . INFLUENZA VACCINE  Completed  . DEXA SCAN  Completed  . PNA vac Low Risk Adult  Completed   Cancer Screenings: Breast: Up to date on Mammogram? Yes  Completed 02/05/16. Scheduled for annual repeat mammogram on 02/08/17 @ 2:20pm @ [facility] Up to date of Bone Density/Dexa? Yes  Completed 06/06/12. Report revealed osteopenia. Repeat osteoporotic screenings no longer required Colorectal: Completed 06/30/16. Repeat colonoscopy every 10 years. Given age, colon cancer screenings no longer required.  Additional Screenings: Hepatitis B/HIV/Syphillis: does not qualify Hepatitis C Screening: doe snot qualify     Plan:    I have personally reviewed and addressed the Medicare Annual Wellness questionnaire and have noted the following in the patient's chart:  A. Medical and social history B. Use of alcohol, tobacco or illicit drugs  C. Current medications and supplements D. Functional ability and status E.  Nutritional status F.  Physical activity G. Advance directives H. List of other physicians I.  Hospitalizations, surgeries, and ER visits in previous 12 months J.  [facility] such as hearing and vision if needed, cognitive and depression L. Referrals and appointments - none  In addition, I have reviewed and discussed with patient certain preventive protocols, quality metrics, and best practice recommendations. A written personalized care plan for preventive  services as well as general preventive health recommendations were provided to patient.  See attached scanned questionnaire for additional information.   Signed,  Aleatha Borer, LPN Nurse Health Advisor  MD Recommendations: None

## 2017-01-26 ENCOUNTER — Encounter: Payer: Self-pay | Admitting: Family Medicine

## 2017-01-26 ENCOUNTER — Ambulatory Visit: Payer: Medicare HMO | Admitting: Family Medicine

## 2017-01-26 VITALS — BP 112/72 | HR 64 | Ht 65.0 in | Wt 130.0 lb

## 2017-01-26 DIAGNOSIS — I10 Essential (primary) hypertension: Secondary | ICD-10-CM

## 2017-01-26 DIAGNOSIS — D509 Iron deficiency anemia, unspecified: Secondary | ICD-10-CM | POA: Diagnosis not present

## 2017-01-26 DIAGNOSIS — E785 Hyperlipidemia, unspecified: Secondary | ICD-10-CM

## 2017-01-26 DIAGNOSIS — E119 Type 2 diabetes mellitus without complications: Secondary | ICD-10-CM

## 2017-01-26 LAB — POCT CBG (FASTING - GLUCOSE)-MANUAL ENTRY: GLUCOSE FASTING, POC: 98 mg/dL (ref 70–99)

## 2017-01-26 MED ORDER — OLMESARTAN MEDOXOMIL 40 MG PO TABS: 40.0000 mg | ORAL_TABLET | Freq: Every day | ORAL | 5 refills | Status: DC

## 2017-01-26 MED ORDER — METFORMIN HCL 500 MG PO TABS: ORAL_TABLET | ORAL | 1 refills | Status: DC

## 2017-01-26 MED ORDER — PRAVASTATIN SODIUM 40 MG PO TABS: 40.0000 mg | ORAL_TABLET | Freq: Every day | ORAL | 1 refills | Status: DC

## 2017-01-26 MED ORDER — HYDROCHLOROTHIAZIDE 25 MG PO TABS: 25.0000 mg | ORAL_TABLET | Freq: Every day | ORAL | 1 refills | Status: DC

## 2017-01-26 MED ORDER — CARVEDILOL 6.25 MG PO TABS: 6.2500 mg | ORAL_TABLET | Freq: Two times a day (BID) | ORAL | 1 refills | Status: DC

## 2017-01-26 NOTE — Progress Notes (Signed)
Name: Vanessa Zamora   MRN: [id number]    DOB: 02-Jan-1941   Date:01/26/2017       Progress Note  Subjective  Chief Complaint  Chief Complaint  Patient presents with  . Diabetes  . Hypertension  . Hyperlipidemia  . Anemia    Diabetes  She presents for her follow-up diabetic visit. She has type 2 diabetes mellitus. Her disease course has been stable. There are no hypoglycemic associated symptoms. Pertinent negatives for hypoglycemia include no confusion, dizziness, headaches, nervousness/anxiousness or sweats. There are no diabetic associated symptoms. Pertinent negatives for diabetes include no blurred vision, no chest pain, no polydipsia and no weight loss. There are no hypoglycemic complications. Symptoms are stable. There are no diabetic complications. Pertinent negatives for diabetic complications include no PVD. Risk factors for coronary artery disease include diabetes mellitus, dyslipidemia and hypertension. Current diabetic treatment includes oral agent (monotherapy). Her weight is stable. She is following a generally healthy diet. She participates in exercise daily. Her home blood glucose trend is fluctuating minimally. Her breakfast blood glucose is taken between 8-9 am. Her breakfast blood glucose range is generally 90-110 mg/dl. An ACE inhibitor/angiotensin II receptor blocker is being taken. She does not see a podiatrist.Eye exam is not current.  Hypertension  This is a chronic problem. The current episode started more than 1 year ago. The problem has been waxing and waning since onset. The problem is controlled. Pertinent negatives include no anxiety, blurred vision, chest pain, headaches, malaise/fatigue, neck pain, orthopnea, palpitations, peripheral edema, PND, shortness of breath or sweats. There are no associated agents to hypertension. There are no known risk factors for coronary artery disease. Past treatments include ACE inhibitors. There are no compliance problems.  There is no  history of angina, kidney disease, CAD/MI, heart failure, left ventricular hypertrophy or PVD. There is no history of chronic renal disease, a hypertension causing med or renovascular disease.  Hyperlipidemia  This is a chronic problem. The current episode started more than 1 year ago. The problem is controlled. Recent lipid tests were reviewed and are normal. She has no history of chronic renal disease, diabetes, hypothyroidism, liver disease, obesity or nephrotic syndrome. There are no known factors aggravating her hyperlipidemia. Pertinent negatives include no chest pain, focal sensory loss, focal weakness, leg pain, myalgias or shortness of breath. Current antihyperlipidemic treatment includes statins. The current treatment provides moderate improvement of lipids. There are no compliance problems.  Risk factors for coronary artery disease include hypertension, dyslipidemia and diabetes mellitus.  Anemia  Presents for follow-up visit. There has been no abdominal pain, anorexia, bruising/bleeding easily, confusion, fever, leg swelling, light-headedness, malaise/fatigue, palpitations, paresthesias or weight loss. Signs of blood loss that are not present include melena. There is no history of chronic renal disease, heart failure or hypothyroidism.    No problem-specific Assessment & Plan notes found for this encounter.   Past Medical History:  Diagnosis Date  . Arthritis    shoulders, knees  . Coronary artery disease   . Diabetes mellitus without complication ([place])   . Hypercholesteremia   . Hypertension   . Myocardial infarction ([place]) 2005  . Seasonal allergies   . Thyroid goiter   . Wears dentures    partial lower    Past Surgical History:  Procedure Laterality Date  . CARDIAC CATHETERIZATION  2005  . CATARACT EXTRACTION Left 10/2014  . CATARACT EXTRACTION W/PHACO Right 11/19/2014   Procedure: CATARACT EXTRACTION PHACO AND INTRAOCULAR LENS PLACEMENT ([place]);  Surgeon: Deirdre Peer  Vin-Parikh,  MD;  Location: [place];  Service: Ophthalmology;  Laterality: Right;  DIABETIC - oral meds  . COLONOSCOPY WITH PROPOFOL N/A 06/30/2016   Procedure: COLONOSCOPY WITH PROPOFOL;  Surgeon: Lollie Sails, MD;  Location: [facility] ENDOSCOPY;  Service: Endoscopy;  Laterality: N/A;  . EYE SURGERY    . VAGINAL HYSTERECTOMY  02/10/1980    Family History  Problem Relation Age of Onset  . Stroke Mother   . Aneurysm Sister   . Heart disease Brother   . Hypertension Brother   . Goiter Maternal Grandmother   . Diabetes Maternal Grandmother   . COPD Brother   . Hypertension Brother   . Breast cancer Neg Hx     Social History   Socioeconomic History  . Marital status: Single    Spouse name: Not on file  . Number of children: 1  . Years of education: Not on file  . Highest education level: Not on file  Social Needs  . Financial resource strain: Not hard at all  . Food insecurity - worry: Never true  . Food insecurity - inability: Never true  . Transportation needs - medical: No  . Transportation needs - non-medical: No  Occupational History  . Occupation: Retired  Tobacco Use  . Smoking status: Former Smoker    Packs/day: 0.25    Years: 30.00    Pack years: 7.50    Types: Cigarettes    Last attempt to quit: 2003    Years since quitting: 15.9  . Smokeless tobacco: Never Used  . Tobacco comment: quit 2003  Substance and Sexual Activity  . Alcohol use: No    Alcohol/week: 0.0 oz  . Drug use: No  . Sexual activity: Not Currently  Other Topics Concern  . Not on file  Social History Narrative  . Not on file    Allergies  Allergen Reactions  . Altace  [Ramipril] Anaphylaxis  . Ace Inhibitors Swelling  . Clonidine Swelling  . Losartan Potassium Other (See Comments)  . Synthroid [Levothyroxine]     Outpatient Medications Prior to Visit  Medication Sig Dispense Refill  . aspirin 81 MG tablet Take 1 tablet by mouth daily.    . Calcium Carbonate-Vit D-Min  (CALTRATE 600+D PLUS MINERALS) 600-800 MG-UNIT TABS Take 1 tablet by mouth daily.    . ferrous sulfate 325 (65 FE) MG tablet Take 325 mg by mouth every other day.    Marland Kitchen glucose blood test strip Use as instructed 50 each 1  . MEGARED OMEGA-3 KRILL OIL 500 MG CAPS Take 1 capsule by mouth 2 (two) times daily. 60 capsule 11  . Multiple Vitamins-Minerals (CENTRUM SILVER ULTRA WOMENS) TABS Take 1 tablet by mouth daily.    . carvedilol (COREG) 6.25 MG tablet Take 1 tablet (6.25 mg total) by mouth 2 (two) times daily. 180 tablet 1  . hydrochlorothiazide (HYDRODIURIL) 25 MG tablet Take 1 tablet (25 mg total) by mouth daily. 90 tablet 1  . metFORMIN (GLUCOPHAGE) 500 MG tablet TAKE 1 TABLET(500 MG) BY MOUTH TWICE DAILY 180 tablet 1  . olmesartan (BENICAR) 40 MG tablet Take 1 tablet (40 mg total) by mouth daily. AM 30 tablet 5  . pravastatin (PRAVACHOL) 40 MG tablet Take 1 tablet (40 mg total) by mouth daily. 90 tablet 1   No facility-administered medications prior to visit.     Review of Systems  Constitutional: Negative for chills, fever, malaise/fatigue and weight loss.  HENT: Negative for ear discharge, ear pain and sore throat.  Eyes: Negative for blurred vision.  Respiratory: Negative for cough, sputum production, shortness of breath and wheezing.   Cardiovascular: Negative for chest pain, palpitations, orthopnea, leg swelling and PND.  Gastrointestinal: Negative for abdominal pain, anorexia, blood in stool, constipation, diarrhea, heartburn, melena and nausea.  Genitourinary: Negative for dysuria, frequency, hematuria and urgency.  Musculoskeletal: Negative for back pain, joint pain, myalgias and neck pain.  Skin: Negative for rash.  Neurological: Negative for dizziness, tingling, sensory change, focal weakness, light-headedness, headaches and paresthesias.  Endo/Heme/Allergies: Negative for environmental allergies and polydipsia. Does not bruise/bleed easily.  Psychiatric/Behavioral: Negative  for confusion, depression and suicidal ideas. The patient is not nervous/anxious and does not have insomnia.      Objective  Vitals:   01/26/17 1027  BP: 112/72  Pulse: 64  Weight: 130 lb (59 kg)  Height: 5\' 5"  (1.651 m)    Physical Exam  Constitutional: She is well-developed, well-nourished, and in no distress. No distress.  HENT:  Head: Normocephalic and atraumatic.  Right Ear: External ear normal.  Left Ear: External ear normal.  Nose: Nose normal.  Mouth/Throat: Oropharynx is clear and moist.  Eyes: Conjunctivae and EOM are normal. Pupils are equal, round, and reactive to light. Right eye exhibits no discharge. Left eye exhibits no discharge.  Neck: Normal range of motion. Neck supple. No JVD present. No thyromegaly present.  Cardiovascular: Normal rate, regular rhythm, normal heart sounds and intact distal pulses. Exam reveals no gallop and no friction rub.  No murmur heard. Pulmonary/Chest: Effort normal and breath sounds normal. She has no wheezes. She has no rales.  Abdominal: Soft. Bowel sounds are normal. She exhibits no mass. There is no tenderness. There is no guarding.  Musculoskeletal: Normal range of motion. She exhibits no edema.  Lymphadenopathy:    She has no cervical adenopathy.  Neurological: She is alert. She has normal reflexes.  Skin: Skin is warm and dry. She is not diaphoretic.  Psychiatric: Mood and affect normal.  Nursing note and vitals reviewed.     Assessment & Plan  Problem List Items Addressed This Visit      Cardiovascular and Mediastinum   Essential (primary) hypertension   Relevant Medications   pravastatin (PRAVACHOL) 40 MG tablet   hydrochlorothiazide (HYDRODIURIL) 25 MG tablet   olmesartan (BENICAR) 40 MG tablet   carvedilol (COREG) 6.25 MG tablet     Endocrine   Type 2 diabetes mellitus without complication, without long-term current use of insulin (HCC) - Primary   Relevant Medications   metFORMIN (GLUCOPHAGE) 500 MG tablet    pravastatin (PRAVACHOL) 40 MG tablet   olmesartan (BENICAR) 40 MG tablet   Other Relevant Orders   POCT CBG (Fasting - Glucose) (Completed)   Hemoglobin A1c   Renal function panel     Other   Dyslipidemia   Relevant Medications   pravastatin (PRAVACHOL) 40 MG tablet   Anemia   Relevant Orders   Hemoglobin A1c   Renal function panel      Meds ordered this encounter  Medications  . metFORMIN (GLUCOPHAGE) 500 MG tablet    Sig: TAKE 1 TABLET(500 MG) BY MOUTH TWICE DAILY    Dispense:  180 tablet    Refill:  1    sched appt for May  . pravastatin (PRAVACHOL) 40 MG tablet    Sig: Take 1 tablet (40 mg total) by mouth daily.    Dispense:  90 tablet    Refill:  1  . hydrochlorothiazide (HYDRODIURIL) 25 MG tablet  Sig: Take 1 tablet (25 mg total) by mouth daily.    Dispense:  90 tablet    Refill:  1    **Patient requests 90 days supply**  . olmesartan (BENICAR) 40 MG tablet    Sig: Take 1 tablet (40 mg total) by mouth daily. AM    Dispense:  30 tablet    Refill:  5    Pt says she is able to take  . carvedilol (COREG) 6.25 MG tablet    Sig: Take 1 tablet (6.25 mg total) by mouth 2 (two) times daily.    Dispense:  180 tablet    Refill:  1    Needs appt for this month      Dr. Otilio Miu [facility]  01/26/17

## 2017-01-27 ENCOUNTER — Other Ambulatory Visit: Payer: Self-pay

## 2017-01-27 LAB — RENAL FUNCTION PANEL
ALBUMIN: 4.2 g/dL (ref 3.5–4.8)
BUN / CREAT RATIO: 20 (ref 12–28)
BUN: 16 mg/dL (ref 8–27)
CHLORIDE: 104 mmol/L (ref 96–106)
CO2: 25 mmol/L (ref 20–29)
CREATININE: 0.81 mg/dL (ref 0.57–1.00)
Calcium: 9.4 mg/dL (ref 8.7–10.3)
GFR, EST AFRICAN AMERICAN: 80 mL/min/{1.73_m2} (ref >59)
GFR, EST NON AFRICAN AMERICAN: 70 mL/min/{1.73_m2} (ref >59)
Glucose: 88 mg/dL (ref 65–99)
Phosphorus: 3.5 mg/dL (ref 2.5–4.5)
Potassium: 4 mmol/L (ref 3.5–5.2)
Sodium: 144 mmol/L (ref 134–144)

## 2017-01-27 LAB — HEMOGLOBIN A1C
Est. average glucose Bld gHb Est-mCnc: 140 mg/dL
HEMOGLOBIN A1C: 6.5 % — AB (ref 4.8–5.6)

## 2017-02-08 ENCOUNTER — Ambulatory Visit
Admission: RE | Admit: 2017-02-08 | Discharge: 2017-02-08 | Disposition: A | Discharge status: Home / Self Care | Payer: Medicare HMO | Source: Ambulatory Visit | Attending: Family Medicine | Admitting: Family Medicine

## 2017-02-08 DIAGNOSIS — Z1231 Encounter for screening mammogram for malignant neoplasm of breast: Secondary | ICD-10-CM | POA: Insufficient documentation

## 2017-03-02 DIAGNOSIS — Z7982 Long term (current) use of aspirin: Secondary | ICD-10-CM | POA: Diagnosis not present

## 2017-03-02 DIAGNOSIS — Z823 Family history of stroke: Secondary | ICD-10-CM | POA: Diagnosis not present

## 2017-03-02 DIAGNOSIS — H547 Unspecified visual loss: Secondary | ICD-10-CM | POA: Diagnosis not present

## 2017-03-02 DIAGNOSIS — H04129 Dry eye syndrome of unspecified lacrimal gland: Secondary | ICD-10-CM | POA: Diagnosis not present

## 2017-03-02 DIAGNOSIS — E785 Hyperlipidemia, unspecified: Secondary | ICD-10-CM | POA: Diagnosis not present

## 2017-03-02 DIAGNOSIS — I10 Essential (primary) hypertension: Secondary | ICD-10-CM | POA: Diagnosis not present

## 2017-03-02 DIAGNOSIS — Z7984 Long term (current) use of oral hypoglycemic drugs: Secondary | ICD-10-CM | POA: Diagnosis not present

## 2017-03-02 DIAGNOSIS — E119 Type 2 diabetes mellitus without complications: Secondary | ICD-10-CM | POA: Diagnosis not present

## 2017-03-02 DIAGNOSIS — J309 Allergic rhinitis, unspecified: Secondary | ICD-10-CM | POA: Diagnosis not present

## 2017-03-02 DIAGNOSIS — I252 Old myocardial infarction: Secondary | ICD-10-CM | POA: Diagnosis not present

## 2017-03-03 ENCOUNTER — Other Ambulatory Visit: Payer: Self-pay | Admitting: Family Medicine

## 2017-03-03 DIAGNOSIS — E119 Type 2 diabetes mellitus without complications: Secondary | ICD-10-CM

## 2017-03-04 DIAGNOSIS — R69 Illness, unspecified: Secondary | ICD-10-CM | POA: Diagnosis not present

## 2017-03-05 DIAGNOSIS — E119 Type 2 diabetes mellitus without complications: Secondary | ICD-10-CM | POA: Diagnosis not present

## 2017-03-09 ENCOUNTER — Other Ambulatory Visit: Payer: Self-pay

## 2017-04-20 ENCOUNTER — Other Ambulatory Visit: Payer: Self-pay | Admitting: Family Medicine

## 2017-04-20 DIAGNOSIS — E119 Type 2 diabetes mellitus without complications: Secondary | ICD-10-CM

## 2017-04-20 DIAGNOSIS — R69 Illness, unspecified: Secondary | ICD-10-CM | POA: Diagnosis not present

## 2017-05-10 DIAGNOSIS — L851 Acquired keratosis [keratoderma] palmaris et plantaris: Secondary | ICD-10-CM | POA: Diagnosis not present

## 2017-05-10 DIAGNOSIS — B351 Tinea unguium: Secondary | ICD-10-CM | POA: Diagnosis not present

## 2017-05-10 DIAGNOSIS — E119 Type 2 diabetes mellitus without complications: Secondary | ICD-10-CM | POA: Diagnosis not present

## 2017-05-24 DIAGNOSIS — E049 Nontoxic goiter, unspecified: Secondary | ICD-10-CM | POA: Diagnosis not present

## 2017-05-24 DIAGNOSIS — E0590 Thyrotoxicosis, unspecified without thyrotoxic crisis or storm: Secondary | ICD-10-CM | POA: Diagnosis not present

## 2017-05-31 DIAGNOSIS — E0590 Thyrotoxicosis, unspecified without thyrotoxic crisis or storm: Secondary | ICD-10-CM | POA: Diagnosis not present

## 2017-05-31 DIAGNOSIS — E118 Type 2 diabetes mellitus with unspecified complications: Secondary | ICD-10-CM | POA: Diagnosis not present

## 2017-05-31 DIAGNOSIS — E785 Hyperlipidemia, unspecified: Secondary | ICD-10-CM | POA: Diagnosis not present

## 2017-05-31 DIAGNOSIS — L42 Pityriasis rosea: Secondary | ICD-10-CM | POA: Diagnosis not present

## 2017-05-31 DIAGNOSIS — D509 Iron deficiency anemia, unspecified: Secondary | ICD-10-CM | POA: Diagnosis not present

## 2017-05-31 DIAGNOSIS — E049 Nontoxic goiter, unspecified: Secondary | ICD-10-CM | POA: Diagnosis not present

## 2017-05-31 DIAGNOSIS — I10 Essential (primary) hypertension: Secondary | ICD-10-CM | POA: Diagnosis not present

## 2017-06-06 ENCOUNTER — Other Ambulatory Visit: Payer: Self-pay | Admitting: Family Medicine

## 2017-06-06 DIAGNOSIS — E119 Type 2 diabetes mellitus without complications: Secondary | ICD-10-CM

## 2017-06-07 DIAGNOSIS — R69 Illness, unspecified: Secondary | ICD-10-CM | POA: Diagnosis not present

## 2017-06-17 ENCOUNTER — Telehealth: Payer: Self-pay

## 2017-06-17 ENCOUNTER — Other Ambulatory Visit: Payer: Self-pay

## 2017-06-17 NOTE — Telephone Encounter (Signed)
Pt called in stating that olmesartan is on back order. She can't take any other ace inhibitors. Last b/p was 112/72- stay off olmesartan , cont carvedilol BID and HCTZ once a day- recheck b/p in 2 weeks

## 2017-07-01 ENCOUNTER — Ambulatory Visit: Payer: Medicare HMO | Admitting: Family Medicine

## 2017-07-01 ENCOUNTER — Encounter: Payer: Self-pay | Admitting: Family Medicine

## 2017-07-01 VITALS — BP 130/82 | HR 76 | Ht 65.0 in | Wt 132.0 lb

## 2017-07-01 DIAGNOSIS — I10 Essential (primary) hypertension: Secondary | ICD-10-CM

## 2017-07-01 NOTE — Progress Notes (Signed)
Name: Vanessa Zamora   MRN: [id number]    DOB: 06/29/1940   Date:07/01/2017       Progress Note  Subjective  Chief Complaint  Chief Complaint  Patient presents with  . Hypertension    dropped olmesartan d/t recall    Patient returns for Blood pressure evaluation since cessation of olmesartin. Readings in the past woulld be in 110/70 range in past but within normal limits now.  Hypertension  This is a chronic problem. The current episode started more than 1 year ago. The problem is unchanged. The problem is controlled. Pertinent negatives include no anxiety, blurred vision, chest pain, headaches, malaise/fatigue, neck pain, orthopnea, palpitations, peripheral edema, PND, shortness of breath or sweats. There are no associated agents to hypertension. Risk factors for coronary artery disease include dyslipidemia and diabetes mellitus. Past treatments include diuretics and beta blockers. The current treatment provides moderate improvement. There are no compliance problems.  There is no history of angina, kidney disease, CAD/MI, CVA, heart failure, left ventricular hypertrophy, PVD or retinopathy. There is no history of chronic renal disease, a hypertension causing med or renovascular disease.    Essential (primary) hypertension Patient unable to take Arb's except for olmesartin. Olmesartin stopped due to call back . Blood pressure stable off olmesartin. Will cont carvitilol and HCTZ.   Past Medical History:  Diagnosis Date  . Arthritis    shoulders, knees  . Coronary artery disease   . Diabetes mellitus without complication ([place])   . Hypercholesteremia   . Hypertension   . Myocardial infarction ([place]) 2005  . Seasonal allergies   . Thyroid goiter   . Wears dentures    partial lower    Past Surgical History:  Procedure Laterality Date  . CARDIAC CATHETERIZATION  2005  . CATARACT EXTRACTION Left 10/2014  . CATARACT EXTRACTION W/PHACO Right 11/19/2014   Procedure: CATARACT EXTRACTION  PHACO AND INTRAOCULAR LENS PLACEMENT ([place]);  Surgeon: Ronnell Freshwater, MD;  Location: [place];  Service: Ophthalmology;  Laterality: Right;  DIABETIC - oral meds  . COLONOSCOPY WITH PROPOFOL N/A 06/30/2016   Procedure: COLONOSCOPY WITH PROPOFOL;  Surgeon: Lollie Sails, MD;  [facility];  Service: Endoscopy;  Laterality: N/A;  . EYE SURGERY    . VAGINAL HYSTERECTOMY  02/10/1980    Family History  Problem Relation Age of Onset  . Stroke Mother   . Aneurysm Sister   . Heart disease Brother   . Hypertension Brother   . Goiter Maternal Grandmother   . Diabetes Maternal Grandmother   . COPD Brother   . Hypertension Brother   . Breast cancer Neg Hx     Social History   Socioeconomic History  . Marital status: Single    Spouse name: Not on file  . Number of children: 1  . Years of education: Not on file  . Highest education level: Not on file  Occupational History  . Occupation: Retired  Scientific laboratory technician  . Financial resource strain: Not hard at all  . Food insecurity:    Worry: Never true    Inability: Never true  . Transportation needs:    Medical: No    Non-medical: No  Tobacco Use  . Smoking status: Former Smoker    Packs/day: 0.25    Years: 30.00    Pack years: 7.50    Types: Cigarettes    Last attempt to quit: 2003    Years since quitting: 16.4  . Smokeless tobacco: Never Used  . Tobacco comment:  quit 2003  Substance and Sexual Activity  . Alcohol use: No    Alcohol/week: 0.0 oz  . Drug use: No  . Sexual activity: Not Currently  Lifestyle  . Physical activity:    Days per week: 5 days    Minutes per session: 60 min  . Stress: Not at all  Relationships  . Social connections:    Talks on phone: Twice a week    Gets together: Once a week    Attends religious service: More than 4 times per year    Active member of club or organization: Yes    Attends meetings of clubs or organizations: More than 4 times per year     Relationship status: Patient refused  . Intimate partner violence:    Fear of current or ex partner: No    Emotionally abused: No    Physically abused: No    Forced sexual activity: No  Other Topics Concern  . Not on file  Social History Narrative  . Not on file    Allergies  Allergen Reactions  . Altace  [Ramipril] Anaphylaxis  . Ace Inhibitors Swelling  . Clonidine Swelling  . Losartan Potassium Other (See Comments)  . Synthroid [Levothyroxine]     Outpatient Medications Prior to Visit  Medication Sig Dispense Refill  . aspirin 81 MG tablet Take 1 tablet by mouth daily.    . Calcium Carbonate-Vit D-Min (CALTRATE 600+D PLUS MINERALS) 600-800 MG-UNIT TABS Take 1 tablet by mouth daily.    . carvedilol (COREG) 6.25 MG tablet Take 1 tablet (6.25 mg total) by mouth 2 (two) times daily. 180 tablet 1  . ferrous sulfate 325 (65 FE) MG tablet Take 325 mg by mouth every other day.    Marland Kitchen glucose blood (ONE TOUCH ULTRA TEST) test strip TEST ONCE DAILY 100 each 3  . hydrochlorothiazide (HYDRODIURIL) 25 MG tablet Take 1 tablet (25 mg total) by mouth daily. 90 tablet 1  . MEGARED OMEGA-3 KRILL OIL 500 MG CAPS Take 1 capsule by mouth 2 (two) times daily. 60 capsule 11  . metFORMIN (GLUCOPHAGE) 500 MG tablet TAKE 1 TABLET(500 MG) BY MOUTH TWICE DAILY 180 tablet 1  . Multiple Vitamins-Minerals (CENTRUM SILVER ULTRA WOMENS) TABS Take 1 tablet by mouth daily.    . pravastatin (PRAVACHOL) 40 MG tablet Take 1 tablet (40 mg total) by mouth daily. 90 tablet 1   No facility-administered medications prior to visit.     Review of Systems  Constitutional: Negative for chills, fever, malaise/fatigue and weight loss.  HENT: Negative for ear discharge, ear pain and sore throat.   Eyes: Negative for blurred vision.  Respiratory: Negative for cough, sputum production, shortness of breath and wheezing.   Cardiovascular: Negative for chest pain, palpitations, orthopnea, leg swelling and PND.   Gastrointestinal: Negative for abdominal pain, blood in stool, constipation, diarrhea, heartburn, melena and nausea.  Genitourinary: Negative for dysuria, frequency, hematuria and urgency.  Musculoskeletal: Negative for back pain, joint pain, myalgias and neck pain.  Skin: Negative for rash.  Neurological: Negative for dizziness, tingling, sensory change, focal weakness and headaches.  Endo/Heme/Allergies: Negative for environmental allergies and polydipsia. Does not bruise/bleed easily.  Psychiatric/Behavioral: Negative for depression and suicidal ideas. The patient is not nervous/anxious and does not have insomnia.      Objective  Vitals:   07/01/17 0911  BP: 130/82  Pulse: 76  Weight: 132 lb (59.9 kg)  Height: 5\' 5"  (1.651 m)    Physical Exam  Constitutional: No distress.  HENT:  Head: Normocephalic and atraumatic.  Right Ear: External ear normal.  Left Ear: External ear normal.  Nose: Nose normal.  Mouth/Throat: Oropharynx is clear and moist.  Eyes: Pupils are equal, round, and reactive to light. Conjunctivae and EOM are normal. Right eye exhibits no discharge. Left eye exhibits no discharge.  Neck: Normal range of motion. Neck supple. No JVD present. No thyromegaly present.  Cardiovascular: Normal rate, regular rhythm, normal heart sounds and intact distal pulses. Exam reveals no gallop and no friction rub.  No murmur heard. Pulmonary/Chest: Effort normal and breath sounds normal.  Abdominal: Soft. Bowel sounds are normal. She exhibits no mass. There is no tenderness. There is no guarding.  Musculoskeletal: Normal range of motion. She exhibits no edema.  Lymphadenopathy:    She has no cervical adenopathy.  Neurological: She is alert. She has normal reflexes.  Skin: Skin is warm and dry. She is not diaphoretic.  Nursing note and vitals reviewed.     Assessment & Plan  Problem List Items Addressed This Visit      Cardiovascular and Mediastinum   Essential  (primary) hypertension - Primary    Patient unable to take Arb's except for olmesartin. Olmesartin stopped due to call back . Blood pressure stable off olmesartin. Will cont carvitilol and HCTZ.         No orders of the defined types were placed in this encounter. The ASCVD Risk score Mikey Bussing DC Jr., et al., 2013) failed to calculate for the following reasons:   The patient has a prior MI or stroke diagnosis    Dr. Otilio Miu [facility]  07/01/17

## 2017-07-01 NOTE — Assessment & Plan Note (Signed)
Patient unable to take Arb's except for olmesartin. Olmesartin stopped due to call back . Blood pressure stable off olmesartin. Will cont carvitilol and HCTZ.

## 2017-07-28 ENCOUNTER — Ambulatory Visit: Payer: Medicare HMO | Admitting: Family Medicine

## 2017-07-28 ENCOUNTER — Encounter: Payer: Self-pay | Admitting: Family Medicine

## 2017-07-28 VITALS — BP 122/70 | HR 60 | Ht 65.0 in | Wt 130.0 lb

## 2017-07-28 DIAGNOSIS — D509 Iron deficiency anemia, unspecified: Secondary | ICD-10-CM | POA: Diagnosis not present

## 2017-07-28 DIAGNOSIS — E7800 Pure hypercholesterolemia, unspecified: Secondary | ICD-10-CM

## 2017-07-28 DIAGNOSIS — E119 Type 2 diabetes mellitus without complications: Secondary | ICD-10-CM | POA: Diagnosis not present

## 2017-07-28 DIAGNOSIS — E785 Hyperlipidemia, unspecified: Secondary | ICD-10-CM

## 2017-07-28 DIAGNOSIS — I10 Essential (primary) hypertension: Secondary | ICD-10-CM

## 2017-07-28 MED ORDER — MEGARED OMEGA-3 KRILL OIL 500 MG PO CAPS: 1.0000 | ORAL_CAPSULE | Freq: Two times a day (BID) | ORAL | 11 refills | Status: DC

## 2017-07-28 MED ORDER — CARVEDILOL 6.25 MG PO TABS: 6.2500 mg | ORAL_TABLET | Freq: Two times a day (BID) | ORAL | 1 refills | Status: DC

## 2017-07-28 MED ORDER — FERROUS SULFATE 325 (65 FE) MG PO TABS: 325.0000 mg | ORAL_TABLET | ORAL | 3 refills | Status: DC

## 2017-07-28 MED ORDER — METFORMIN HCL 500 MG PO TABS: ORAL_TABLET | ORAL | 1 refills | Status: DC

## 2017-07-28 MED ORDER — ASPIRIN 81 MG PO TABS: 81.0000 mg | ORAL_TABLET | Freq: Every day | ORAL | 3 refills | Status: AC

## 2017-07-28 MED ORDER — PRAVASTATIN SODIUM 40 MG PO TABS: 40.0000 mg | ORAL_TABLET | Freq: Every day | ORAL | 1 refills | Status: DC

## 2017-07-28 MED ORDER — HYDROCHLOROTHIAZIDE 25 MG PO TABS: 25.0000 mg | ORAL_TABLET | Freq: Every day | ORAL | 1 refills | Status: DC

## 2017-07-28 NOTE — Assessment & Plan Note (Signed)
Controlled on pravachol 40mg  and omega 3. Will check lipid panel.

## 2017-07-28 NOTE — Assessment & Plan Note (Signed)
Controlled. Will continue metformen 500mg  bid and check a1c and renal panel. Diabetic foot exam performed and was normal.

## 2017-07-28 NOTE — Assessment & Plan Note (Signed)
Stable.for years. Will continue iron supplement 325 mg daily and monitor with cbc.

## 2017-07-28 NOTE — Progress Notes (Signed)
Name: Vanessa Zamora   MRN: [id number]    DOB: [birth year]   Date:07/28/2017       Progress Note  Subjective  Chief Complaint  Chief Complaint  Patient presents with  . Diabetes  . Hypertension    had to stop olmesartan due to back order- recheck off of that med  . Hyperlipidemia    Diabetes  She presents for her follow-up diabetic visit. She has type 2 diabetes mellitus. Her disease course has been stable. There are no hypoglycemic associated symptoms. Pertinent negatives for hypoglycemia include no dizziness, headaches, nervousness/anxiousness or sweats. There are no diabetic associated symptoms. Pertinent negatives for diabetes include no blurred vision, no chest pain, no fatigue, no foot paresthesias, no foot ulcerations, no polydipsia, no polyphagia, no polyuria, no visual change, no weakness and no weight loss. There are no hypoglycemic complications. Symptoms are stable. Pertinent negatives for diabetic complications include no autonomic neuropathy, CVA, heart disease, impotence, nephropathy, peripheral neuropathy or PVD. Risk factors for coronary artery disease include dyslipidemia, diabetes mellitus, hypertension and post-menopausal. Current diabetic treatment includes oral agent (monotherapy). She is compliant with treatment all of the time. She is following a generally healthy diet. Meal planning includes avoidance of concentrated sweets. She participates in exercise daily (walking). There is no change in her home blood glucose trend. Her breakfast blood glucose is taken between 8-9 am. Her breakfast blood glucose range is generally 90-110 mg/dl. An ACE inhibitor/angiotensin II receptor blocker is not being taken. She sees a podiatrist.Eye exam is current.  Hypertension  This is a chronic problem. The current episode started more than 1 year ago. The problem is unchanged. The problem is controlled. Pertinent negatives include no anxiety, blurred vision, chest pain, headaches,  malaise/fatigue, neck pain, orthopnea, palpitations, peripheral edema, PND, shortness of breath or sweats. Risk factors for coronary artery disease include diabetes mellitus. Past treatments include beta blockers and diuretics. There are no compliance problems.  There is no history of angina, kidney disease, CAD/MI, CVA, heart failure, left ventricular hypertrophy or PVD. There is no history of chronic renal disease, a hypertension causing med or renovascular disease.  Hyperlipidemia  This is a chronic problem. The problem is controlled. Exacerbating diseases include diabetes. She has no history of chronic renal disease. Factors aggravating her hyperlipidemia include beta blockers and thiazides. Pertinent negatives include no chest pain, focal weakness, myalgias or shortness of breath. Current antihyperlipidemic treatment includes exercise and diet change (omega red). The current treatment provides moderate improvement of lipids. There are no compliance problems.   Anemia  Presents for follow-up visit. There has been no abdominal pain, bruising/bleeding easily, fever, light-headedness, malaise/fatigue, palpitations or weight loss. Signs of blood loss that are not present include hematemesis, hematochezia, melena, menorrhagia and vaginal bleeding. There is no history of chronic renal disease or heart failure. There are no compliance problems.     Type 2 diabetes mellitus without complication, without long-term current use of insulin (HCC) Controlled. Will continue metformen 500mg  bid and check a1c and renal panel. Diabetic foot exam performed and was normal.  Dyslipidemia Controlled on pravachol 40mg  and omega 3. Will check lipid panel.  Essential (primary) hypertension Controlled. Renal panel obtained and will continue coreg 6.25 and hctz 25mg .   Anemia Stable.for years. Will continue iron supplement 325 mg daily and monitor with cbc.   Past Medical History:  Diagnosis Date  . Arthritis     shoulders, knees  . Coronary artery disease   . Diabetes mellitus without complication ([place])   .  Hypercholesteremia   . Hypertension   . Myocardial infarction ([place]) 2005  . Seasonal allergies   . Thyroid goiter   . Wears dentures    partial lower    Past Surgical History:  Procedure Laterality Date  . CARDIAC CATHETERIZATION  2005  . CATARACT EXTRACTION Left 10/2014  . CATARACT EXTRACTION W/PHACO Right 11/19/2014   Procedure: CATARACT EXTRACTION PHACO AND INTRAOCULAR LENS PLACEMENT ([place]);  Surgeon: Ronnell Freshwater, MD;  Location: [facility];  Service: Ophthalmology;  Laterality: Right;  DIABETIC - oral meds  . COLONOSCOPY WITH PROPOFOL N/A 06/30/2016   Procedure: COLONOSCOPY WITH PROPOFOL;  Surgeon: Lollie Sails, MD;  Location: [facility] ENDOSCOPY;  Service: Endoscopy;  Laterality: N/A;  . EYE SURGERY    . VAGINAL HYSTERECTOMY  02/10/1980    Family History  Problem Relation Age of Onset  . Stroke Mother   . Aneurysm Sister   . Heart disease Brother   . Hypertension Brother   . Goiter Maternal Grandmother   . Diabetes Maternal Grandmother   . COPD Brother   . Hypertension Brother   . Breast cancer Neg Hx     Social History   Socioeconomic History  . Marital status: Single    Spouse name: Not on file  . Number of children: 1  . Years of education: Not on file  . Highest education level: Not on file  Occupational History  . Occupation: Retired  Scientific laboratory technician  . Financial resource strain: Not hard at all  . Food insecurity:    Worry: Never true    Inability: Never true  . Transportation needs:    Medical: No    Non-medical: No  Tobacco Use  . Smoking status: Former Smoker    Packs/day: 0.25    Years: 30.00    Pack years: 7.50    Types: Cigarettes    Last attempt to quit: 2003    Years since quitting: 16.4  . Smokeless tobacco: Never Used  . Tobacco comment: quit 2003  Substance and Sexual Activity  . Alcohol use: No    Alcohol/week: 0.0 oz   . Drug use: No  . Sexual activity: Not Currently  Lifestyle  . Physical activity:    Days per week: 5 days    Minutes per session: 60 min  . Stress: Not at all  Relationships  . Social connections:    Talks on phone: Twice a week    Gets together: Once a week    Attends religious service: More than 4 times per year    Active member of club or organization: Yes    Attends meetings of clubs or organizations: More than 4 times per year    Relationship status: Patient refused  . Intimate partner violence:    Fear of current or ex partner: No    Emotionally abused: No    Physically abused: No    Forced sexual activity: No  Other Topics Concern  . Not on file  Social History Narrative  . Not on file    Allergies  Allergen Reactions  . Altace  [Ramipril] Anaphylaxis  . Ace Inhibitors Swelling  . Clonidine Swelling  . Losartan Potassium Other (See Comments)  . Synthroid [Levothyroxine]     Outpatient Medications Prior to Visit  Medication Sig Dispense Refill  . Calcium Carbonate-Vit D-Min (CALTRATE 600+D PLUS MINERALS) 600-800 MG-UNIT TABS Take 1 tablet by mouth daily.    Marland Kitchen glucose blood (ONE TOUCH ULTRA TEST) test strip TEST ONCE DAILY  100 each 3  . Multiple Vitamins-Minerals (CENTRUM SILVER ULTRA WOMENS) TABS Take 1 tablet by mouth daily.    Marland Kitchen aspirin 81 MG tablet Take 1 tablet by mouth daily.    . carvedilol (COREG) 6.25 MG tablet Take 1 tablet (6.25 mg total) by mouth 2 (two) times daily. 180 tablet 1  . ferrous sulfate 325 (65 FE) MG tablet Take 325 mg by mouth every other day.    . hydrochlorothiazide (HYDRODIURIL) 25 MG tablet Take 1 tablet (25 mg total) by mouth daily. 90 tablet 1  . MEGARED OMEGA-3 KRILL OIL 500 MG CAPS Take 1 capsule by mouth 2 (two) times daily. 60 capsule 11  . metFORMIN (GLUCOPHAGE) 500 MG tablet TAKE 1 TABLET(500 MG) BY MOUTH TWICE DAILY 180 tablet 1  . pravastatin (PRAVACHOL) 40 MG tablet Take 1 tablet (40 mg total) by mouth daily. 90 tablet 1    No facility-administered medications prior to visit.     Review of Systems  Constitutional: Negative for chills, fatigue, fever, malaise/fatigue and weight loss.  HENT: Negative for ear discharge, ear pain and sore throat.   Eyes: Negative for blurred vision.  Respiratory: Negative for cough, sputum production, shortness of breath and wheezing.   Cardiovascular: Negative for chest pain, palpitations, orthopnea, leg swelling and PND.  Gastrointestinal: Negative for abdominal pain, blood in stool, constipation, diarrhea, heartburn, hematemesis, hematochezia, melena and nausea.  Genitourinary: Negative for dysuria, frequency, hematuria, impotence, menorrhagia, urgency and vaginal bleeding.  Musculoskeletal: Negative for back pain, joint pain, myalgias and neck pain.  Skin: Negative for rash.  Neurological: Negative for dizziness, tingling, sensory change, focal weakness, weakness, light-headedness and headaches.  Endo/Heme/Allergies: Negative for environmental allergies, polydipsia and polyphagia. Does not bruise/bleed easily.  Psychiatric/Behavioral: Negative for depression and suicidal ideas. The patient is not nervous/anxious and does not have insomnia.      Objective  Vitals:   07/28/17 0802  BP: 122/70  Pulse: 60  Weight: 130 lb (59 kg)  Height: 5\' 5"  (1.651 m)    Physical Exam  Constitutional: She is oriented to person, place, and time. She appears well-developed and well-nourished.  HENT:  Head: Normocephalic.  Right Ear: External ear normal.  Left Ear: External ear normal.  Mouth/Throat: Oropharynx is clear and moist.  Eyes: Pupils are equal, round, and reactive to light. Conjunctivae and EOM are normal. Lids are everted and swept, no foreign bodies found. Left eye exhibits no hordeolum. No foreign body present in the left eye. Right conjunctiva is not injected. Left conjunctiva is not injected. No scleral icterus.  Neck: Normal range of motion. Neck supple. No JVD  present. No tracheal deviation present. No thyromegaly present.  Cardiovascular: Normal rate, regular rhythm, S1 normal, S2 normal and intact distal pulses. PMI is not displaced. Exam reveals no gallop, no S3, no S4 and no friction rub.  Murmur heard.  Systolic murmur is present with a grade of 2/6. Pulses:      Carotid pulses are 2+ on the right side, and 2+ on the left side.      Radial pulses are 2+ on the right side, and 2+ on the left side.       Femoral pulses are 2+ on the right side, and 2+ on the left side.      Popliteal pulses are 2+ on the right side, and 2+ on the left side.       Dorsalis pedis pulses are 2+ on the right side, and 2+ on the left side.  Posterior tibial pulses are 2+ on the right side, and 2+ on the left side.  Pulmonary/Chest: Effort normal and breath sounds normal. No respiratory distress. She has no wheezes. She has no rales.  Abdominal: Soft. Bowel sounds are normal. She exhibits no mass. There is no hepatosplenomegaly. There is no tenderness. There is no rebound and no guarding.  Musculoskeletal: Normal range of motion. She exhibits no edema or tenderness.  Feet:  Right Foot:  Protective Sensation: 10 sites tested. 10 sites sensed.  Skin Integrity: Negative for ulcer, blister, skin breakdown, erythema, warmth, callus or dry skin.  Left Foot:  Protective Sensation: 10 sites tested. 10 sites sensed.  Skin Integrity: Negative for ulcer, blister, skin breakdown, erythema, warmth, callus or dry skin.  Lymphadenopathy:    She has no cervical adenopathy.  Neurological: She is alert and oriented to person, place, and time. She has normal strength. She displays normal reflexes. No cranial nerve deficit.  Skin: Skin is warm. No rash noted.  Psychiatric: She has a normal mood and affect. Her mood appears not anxious. She does not exhibit a depressed mood.  Nursing note and vitals reviewed.     Assessment & Plan  Problem List Items Addressed This Visit       Cardiovascular and Mediastinum   Essential (primary) hypertension    Controlled. Renal panel obtained and will continue coreg 6.25 and hctz 25mg .       Relevant Medications   pravastatin (PRAVACHOL) 40 MG tablet   hydrochlorothiazide (HYDRODIURIL) 25 MG tablet   carvedilol (COREG) 6.25 MG tablet   aspirin 81 MG tablet   Other Relevant Orders   Renal Function Panel     Endocrine   Type 2 diabetes mellitus without complication, without long-term current use of insulin (HCC) - Primary    Controlled. Will continue metformen 500mg  bid and check a1c and renal panel. Diabetic foot exam performed and was normal.      Relevant Medications   metFORMIN (GLUCOPHAGE) 500 MG tablet   pravastatin (PRAVACHOL) 40 MG tablet   aspirin 81 MG tablet   Other Relevant Orders   Hemoglobin A1c   Renal Function Panel   Microalbumin / creatinine urine ratio     Other   Hypercholesteremia   Relevant Medications   pravastatin (PRAVACHOL) 40 MG tablet   hydrochlorothiazide (HYDRODIURIL) 25 MG tablet   carvedilol (COREG) 6.25 MG tablet   MEGARED OMEGA-3 KRILL OIL 500 MG CAPS   aspirin 81 MG tablet   Other Relevant Orders   Lipid panel   Dyslipidemia    Controlled on pravachol 40mg  and omega 3. Will check lipid panel.      Relevant Medications   pravastatin (PRAVACHOL) 40 MG tablet   Other Relevant Orders   Lipid panel   Anemia    Stable.for years. Will continue iron supplement 325 mg daily and monitor with cbc.      Relevant Medications   ferrous sulfate 325 (65 FE) MG tablet   Other Relevant Orders   CBC with Differential/Platelet      Meds ordered this encounter  Medications  . metFORMIN (GLUCOPHAGE) 500 MG tablet    Sig: TAKE 1 TABLET(500 MG) BY MOUTH TWICE DAILY    Dispense:  180 tablet    Refill:  1    sched appt for May  . pravastatin (PRAVACHOL) 40 MG tablet    Sig: Take 1 tablet (40 mg total) by mouth daily.    Dispense:  90 tablet    Refill:  1  . hydrochlorothiazide  (HYDRODIURIL) 25 MG tablet    Sig: Take 1 tablet (25 mg total) by mouth daily.    Dispense:  90 tablet    Refill:  1    **Patient requests 90 days supply**  . carvedilol (COREG) 6.25 MG tablet    Sig: Take 1 tablet (6.25 mg total) by mouth 2 (two) times daily.    Dispense:  180 tablet    Refill:  1    Needs appt for this month  . MEGARED OMEGA-3 KRILL OIL 500 MG CAPS    Sig: Take 1 capsule by mouth 2 (two) times daily.    Dispense:  60 capsule    Refill:  11  . aspirin 81 MG tablet    Sig: Take 1 tablet (81 mg total) by mouth daily.    Dispense:  100 tablet    Refill:  3  . ferrous sulfate 325 (65 FE) MG tablet    Sig: Take 1 tablet (325 mg total) by mouth every other day.    Dispense:  100 tablet    Refill:  3      Dr. Otilio Miu [facility]  07/28/17

## 2017-07-28 NOTE — Assessment & Plan Note (Signed)
Controlled. Renal panel obtained and will continue coreg 6.25 and hctz 25mg .

## 2017-07-29 LAB — CBC WITH DIFFERENTIAL/PLATELET
BASOS: 0 %
Basophils Absolute: 0 10*3/uL (ref 0.0–0.2)
EOS (ABSOLUTE): 0 10*3/uL (ref 0.0–0.4)
EOS: 1 %
HEMATOCRIT: 37.5 % (ref 34.0–46.6)
HEMOGLOBIN: 11.3 g/dL (ref 11.1–15.9)
IMMATURE GRANULOCYTES: 0 %
Immature Grans (Abs): 0 10*3/uL (ref 0.0–0.1)
Lymphocytes Absolute: 1.9 10*3/uL (ref 0.7–3.1)
Lymphs: 35 %
MCH: 23.1 pg — ABNORMAL LOW (ref 26.6–33.0)
MCHC: 30.1 g/dL — ABNORMAL LOW (ref 31.5–35.7)
MCV: 77 fL — AB (ref 79–97)
MONOCYTES: 7 %
Monocytes Absolute: 0.4 10*3/uL (ref 0.1–0.9)
Neutrophils Absolute: 3.1 10*3/uL (ref 1.4–7.0)
Neutrophils: 57 %
Platelets: 304 10*3/uL (ref 150–450)
RBC: 4.89 x10E6/uL (ref 3.77–5.28)
RDW: 16.7 % — ABNORMAL HIGH (ref 12.3–15.4)
WBC: 5.5 10*3/uL (ref 3.4–10.8)

## 2017-07-29 LAB — RENAL FUNCTION PANEL
ALBUMIN: 4.6 g/dL (ref 3.5–4.8)
BUN/Creatinine Ratio: 25 (ref 12–28)
BUN: 19 mg/dL (ref 8–27)
CO2: 25 mmol/L (ref 20–29)
Calcium: 9.4 mg/dL (ref 8.7–10.3)
Chloride: 101 mmol/L (ref 96–106)
Creatinine, Ser: 0.75 mg/dL (ref 0.57–1.00)
GFR calc non Af Amer: 77 mL/min/{1.73_m2} (ref >59)
GFR, EST AFRICAN AMERICAN: 88 mL/min/{1.73_m2} (ref >59)
GLUCOSE: 86 mg/dL (ref 65–99)
PHOSPHORUS: 3.7 mg/dL (ref 2.5–4.5)
POTASSIUM: 4 mmol/L (ref 3.5–5.2)
Sodium: 142 mmol/L (ref 134–144)

## 2017-07-29 LAB — LIPID PANEL
CHOL/HDL RATIO: 1.9 ratio (ref 0.0–4.4)
Cholesterol, Total: 189 mg/dL (ref 100–199)
HDL: 98 mg/dL (ref >39)
LDL Calculated: 83 mg/dL (ref 0–99)
TRIGLYCERIDES: 42 mg/dL (ref 0–149)
VLDL CHOLESTEROL CAL: 8 mg/dL (ref 5–40)

## 2017-07-29 LAB — MICROALBUMIN / CREATININE URINE RATIO
Creatinine, Urine: 190.8 mg/dL
Microalb/Creat Ratio: 9.7 mg/g creat (ref 0.0–30.0)
Microalbumin, Urine: 18.6 ug/mL

## 2017-07-29 LAB — HEMOGLOBIN A1C
Est. average glucose Bld gHb Est-mCnc: 131 mg/dL
Hgb A1c MFr Bld: 6.2 % — ABNORMAL HIGH (ref 4.8–5.6)

## 2017-07-30 ENCOUNTER — Other Ambulatory Visit: Payer: Self-pay

## 2017-08-03 DIAGNOSIS — H547 Unspecified visual loss: Secondary | ICD-10-CM | POA: Diagnosis not present

## 2017-08-06 DIAGNOSIS — H04123 Dry eye syndrome of bilateral lacrimal glands: Secondary | ICD-10-CM | POA: Diagnosis not present

## 2017-09-03 DIAGNOSIS — R69 Illness, unspecified: Secondary | ICD-10-CM | POA: Diagnosis not present

## 2017-09-14 DIAGNOSIS — I10 Essential (primary) hypertension: Secondary | ICD-10-CM | POA: Diagnosis not present

## 2017-09-14 DIAGNOSIS — E119 Type 2 diabetes mellitus without complications: Secondary | ICD-10-CM | POA: Diagnosis not present

## 2017-09-14 DIAGNOSIS — I208 Other forms of angina pectoris: Secondary | ICD-10-CM | POA: Diagnosis not present

## 2017-09-14 DIAGNOSIS — M1990 Unspecified osteoarthritis, unspecified site: Secondary | ICD-10-CM | POA: Diagnosis not present

## 2017-09-14 DIAGNOSIS — I429 Cardiomyopathy, unspecified: Secondary | ICD-10-CM | POA: Diagnosis not present

## 2017-09-14 DIAGNOSIS — E7849 Other hyperlipidemia: Secondary | ICD-10-CM | POA: Diagnosis not present

## 2017-09-14 DIAGNOSIS — E079 Disorder of thyroid, unspecified: Secondary | ICD-10-CM | POA: Diagnosis not present

## 2017-09-14 DIAGNOSIS — R011 Cardiac murmur, unspecified: Secondary | ICD-10-CM | POA: Diagnosis not present

## 2017-09-14 DIAGNOSIS — I2510 Atherosclerotic heart disease of native coronary artery without angina pectoris: Secondary | ICD-10-CM | POA: Diagnosis not present

## 2017-09-15 DIAGNOSIS — D2370 Other benign neoplasm of skin of unspecified lower limb, including hip: Secondary | ICD-10-CM | POA: Diagnosis not present

## 2017-09-15 DIAGNOSIS — E119 Type 2 diabetes mellitus without complications: Secondary | ICD-10-CM | POA: Diagnosis not present

## 2017-09-15 DIAGNOSIS — B351 Tinea unguium: Secondary | ICD-10-CM | POA: Diagnosis not present

## 2017-09-30 DIAGNOSIS — I429 Cardiomyopathy, unspecified: Secondary | ICD-10-CM | POA: Diagnosis not present

## 2017-09-30 DIAGNOSIS — I2510 Atherosclerotic heart disease of native coronary artery without angina pectoris: Secondary | ICD-10-CM | POA: Diagnosis not present

## 2017-09-30 DIAGNOSIS — I208 Other forms of angina pectoris: Secondary | ICD-10-CM | POA: Diagnosis not present

## 2017-11-11 ENCOUNTER — Ambulatory Visit: Payer: Medicare HMO

## 2017-11-12 ENCOUNTER — Ambulatory Visit (INDEPENDENT_AMBULATORY_CARE_PROVIDER_SITE_OTHER): Payer: Medicare HMO

## 2017-11-12 DIAGNOSIS — Z23 Encounter for immunization: Secondary | ICD-10-CM

## 2017-11-22 DIAGNOSIS — I10 Essential (primary) hypertension: Secondary | ICD-10-CM | POA: Diagnosis not present

## 2017-11-22 DIAGNOSIS — E0590 Thyrotoxicosis, unspecified without thyrotoxic crisis or storm: Secondary | ICD-10-CM | POA: Diagnosis not present

## 2017-11-22 DIAGNOSIS — E118 Type 2 diabetes mellitus with unspecified complications: Secondary | ICD-10-CM | POA: Diagnosis not present

## 2017-11-22 DIAGNOSIS — D509 Iron deficiency anemia, unspecified: Secondary | ICD-10-CM | POA: Diagnosis not present

## 2017-11-22 DIAGNOSIS — L42 Pityriasis rosea: Secondary | ICD-10-CM | POA: Diagnosis not present

## 2017-11-22 DIAGNOSIS — E785 Hyperlipidemia, unspecified: Secondary | ICD-10-CM | POA: Diagnosis not present

## 2017-11-22 DIAGNOSIS — E049 Nontoxic goiter, unspecified: Secondary | ICD-10-CM | POA: Diagnosis not present

## 2017-11-29 DIAGNOSIS — E118 Type 2 diabetes mellitus with unspecified complications: Secondary | ICD-10-CM | POA: Diagnosis not present

## 2017-11-29 DIAGNOSIS — I10 Essential (primary) hypertension: Secondary | ICD-10-CM | POA: Diagnosis not present

## 2017-11-29 DIAGNOSIS — D509 Iron deficiency anemia, unspecified: Secondary | ICD-10-CM | POA: Diagnosis not present

## 2017-11-29 DIAGNOSIS — E785 Hyperlipidemia, unspecified: Secondary | ICD-10-CM | POA: Diagnosis not present

## 2017-11-29 DIAGNOSIS — L42 Pityriasis rosea: Secondary | ICD-10-CM | POA: Diagnosis not present

## 2017-11-29 DIAGNOSIS — E049 Nontoxic goiter, unspecified: Secondary | ICD-10-CM | POA: Diagnosis not present

## 2017-11-29 DIAGNOSIS — E0590 Thyrotoxicosis, unspecified without thyrotoxic crisis or storm: Secondary | ICD-10-CM | POA: Diagnosis not present

## 2017-12-03 DIAGNOSIS — M1711 Unilateral primary osteoarthritis, right knee: Secondary | ICD-10-CM | POA: Diagnosis not present

## 2017-12-03 DIAGNOSIS — M25561 Pain in right knee: Secondary | ICD-10-CM | POA: Diagnosis not present

## 2018-01-10 ENCOUNTER — Other Ambulatory Visit: Payer: Self-pay | Admitting: Family Medicine

## 2018-01-10 DIAGNOSIS — Z1231 Encounter for screening mammogram for malignant neoplasm of breast: Secondary | ICD-10-CM

## 2018-01-17 DIAGNOSIS — B351 Tinea unguium: Secondary | ICD-10-CM | POA: Diagnosis not present

## 2018-01-17 DIAGNOSIS — L851 Acquired keratosis [keratoderma] palmaris et plantaris: Secondary | ICD-10-CM | POA: Diagnosis not present

## 2018-01-17 DIAGNOSIS — E119 Type 2 diabetes mellitus without complications: Secondary | ICD-10-CM | POA: Diagnosis not present

## 2018-01-24 ENCOUNTER — Ambulatory Visit (INDEPENDENT_AMBULATORY_CARE_PROVIDER_SITE_OTHER): Payer: Medicare HMO

## 2018-01-24 VITALS — BP 132/84 | HR 72 | Temp 97.3°F | Ht 65.0 in | Wt 130.0 lb

## 2018-01-24 DIAGNOSIS — I2510 Atherosclerotic heart disease of native coronary artery without angina pectoris: Secondary | ICD-10-CM | POA: Insufficient documentation

## 2018-01-24 DIAGNOSIS — R011 Cardiac murmur, unspecified: Secondary | ICD-10-CM | POA: Insufficient documentation

## 2018-01-24 DIAGNOSIS — I219 Acute myocardial infarction, unspecified: Secondary | ICD-10-CM | POA: Insufficient documentation

## 2018-01-24 DIAGNOSIS — Z0000 Encounter for general adult medical examination without abnormal findings: Secondary | ICD-10-CM

## 2018-01-24 DIAGNOSIS — M8580 Other specified disorders of bone density and structure, unspecified site: Secondary | ICD-10-CM | POA: Diagnosis not present

## 2018-01-24 DIAGNOSIS — Z780 Asymptomatic menopausal state: Secondary | ICD-10-CM

## 2018-01-24 DIAGNOSIS — E119 Type 2 diabetes mellitus without complications: Secondary | ICD-10-CM | POA: Insufficient documentation

## 2018-01-24 NOTE — Patient Instructions (Addendum)
[name] , Thank you for taking time to come for your Medicare Wellness Visit. I appreciate your ongoing commitment to your health goals. Please review the following plan we discussed and let me know if I can assist you in the future.   Screening recommendations/referrals: Colonoscopy: done 2018  Mammogram: scheduled for 02/11/2018 Bone Density: Please call [phone] to schedule your bone density exam. Recommended yearly ophthalmology/optometry visit for glaucoma screening and checkup Recommended yearly dental visit for hygiene and checkup  Vaccinations: Influenza vaccine: done 11/12/17 Pneumococcal vaccine: done 12/18/14 Tdap vaccine: done 01/28/16 Shingles vaccine: Part 1 of Shingrix vaccine completed at [facility]. Please complete second dose.    Advanced directives: Advance directive discussed with you today. I have provided a copy for you to complete at home and have notarized. Once this is complete please bring a copy in to our office so we can scan it into your chart.  Conditions/risks identified: Recommend drinking 6-8 glasses of water per day.  Next appointment: Please follow up in one year for your Medicare Annual Wellness visit.     Preventive Care 59 Years and Older, Female Preventive care refers to lifestyle choices and visits with your health care provider that can promote health and wellness. What does preventive care include?  A yearly physical exam. This is also called an annual well check.  Dental exams once or twice a year.  Routine eye exams. Ask your health care provider how often you should have your eyes checked.  Personal lifestyle choices, including:  Daily care of your teeth and gums.  Regular physical activity.  Eating a healthy diet.  Avoiding tobacco and drug use.  Limiting alcohol use.  Practicing safe sex.  Taking low-dose aspirin every day.  Taking vitamin and mineral supplements as recommended by your health care provider. What  happens during an annual well check? The services and screenings done by your health care provider during your annual well check will depend on your age, overall health, lifestyle risk factors, and family history of disease. Counseling  Your health care provider may ask you questions about your:  Alcohol use.  Tobacco use.  Drug use.  Emotional well-being.  Home and relationship well-being.  Sexual activity.  Eating habits.  History of falls.  Memory and ability to understand (cognition).  Work and work Statistician.  Reproductive health. Screening  You may have the following tests or measurements:  Height, weight, and BMI.  Blood pressure.  Lipid and cholesterol levels. These may be checked every 5 years, or more frequently if you are over 34 years old.  Skin check.  Lung cancer screening. You may have this screening every year starting at age 68 if you have a 30-pack-year history of smoking and currently smoke or have quit within the past 15 years.  Fecal occult blood test (FOBT) of the stool. You may have this test every year starting at age 86.  Flexible sigmoidoscopy or colonoscopy. You may have a sigmoidoscopy every 5 years or a colonoscopy every 10 years starting at age 17.  Hepatitis C blood test.  Hepatitis B blood test.  Sexually transmitted disease (STD) testing.  Diabetes screening. This is done by checking your blood sugar (glucose) after you have not eaten for a while (fasting). You may have this done every 1-3 years.  Bone density scan. This is done to screen for osteoporosis. You may have this done starting at age 68.  Mammogram. This may be done every 1-2 years. Talk to your health  care provider about how often you should have regular mammograms. Talk with your health care provider about your test results, treatment options, and if necessary, the need for more tests. Vaccines  Your health care provider may recommend certain vaccines, such  as:  Influenza vaccine. This is recommended every year.  Tetanus, diphtheria, and acellular pertussis (Tdap, Td) vaccine. You may need a Td booster every 10 years.  Zoster vaccine. You may need this after age 57.  Pneumococcal 13-valent conjugate (PCV13) vaccine. One dose is recommended after age 44.  Pneumococcal polysaccharide (PPSV23) vaccine. One dose is recommended after age 14. Talk to your health care provider about which screenings and vaccines you need and how often you need them. This information is not intended to replace advice given to you by your health care provider. Make sure you discuss any questions you have with your health care provider. Document Released: 02/22/2015 Document Revised: 10/16/2015 Document Reviewed: 11/27/2014 Elsevier Interactive Patient Education  2017 [place] Prevention in the Home Falls can cause injuries. They can happen to people of all ages. There are many things you can do to make your home safe and to help prevent falls. What can I do on the outside of my home?  Regularly fix the edges of walkways and driveways and fix any cracks.  Remove anything that might make you trip as you walk through a door, such as a raised step or threshold.  Trim any bushes or trees on the path to your home.  Use bright outdoor lighting.  Clear any walking paths of anything that might make someone trip, such as rocks or tools.  Regularly check to see if handrails are loose or broken. Make sure that both sides of any steps have handrails.  Any raised decks and porches should have guardrails on the edges.  Have any leaves, snow, or ice cleared regularly.  Use sand or salt on walking paths during winter.  Clean up any spills in your garage right away. This includes oil or grease spills. What can I do in the bathroom?  Use night lights.  Install grab bars by the toilet and in the tub and shower. Do not use towel bars as grab bars.  Use  non-skid mats or decals in the tub or shower.  If you need to sit down in the shower, use a plastic, non-slip stool.  Keep the floor dry. Clean up any water that spills on the floor as soon as it happens.  Remove soap buildup in the tub or shower regularly.  Attach bath mats securely with double-sided non-slip rug tape.  Do not have throw rugs and other things on the floor that can make you trip. What can I do in the bedroom?  Use night lights.  Make sure that you have a light by your bed that is easy to reach.  Do not use any sheets or blankets that are too big for your bed. They should not hang down onto the floor.  Have a firm chair that has side arms. You can use this for support while you get dressed.  Do not have throw rugs and other things on the floor that can make you trip. What can I do in the kitchen?  Clean up any spills right away.  Avoid walking on wet floors.  Keep items that you use a lot in easy-to-reach places.  If you need to reach something above you, use a strong step stool that has a grab  bar.  Keep electrical cords out of the way.  Do not use floor polish or wax that makes floors slippery. If you must use wax, use non-skid floor wax.  Do not have throw rugs and other things on the floor that can make you trip. What can I do with my stairs?  Do not leave any items on the stairs.  Make sure that there are handrails on both sides of the stairs and use them. Fix handrails that are broken or loose. Make sure that handrails are as long as the stairways.  Check any carpeting to make sure that it is firmly attached to the stairs. Fix any carpet that is loose or worn.  Avoid having throw rugs at the top or bottom of the stairs. If you do have throw rugs, attach them to the floor with carpet tape.  Make sure that you have a light switch at the top of the stairs and the bottom of the stairs. If you do not have them, ask someone to add them for you. What  else can I do to help prevent falls?  Wear shoes that:  Do not have high heels.  Have rubber bottoms.  Are comfortable and fit you well.  Are closed at the toe. Do not wear sandals.  If you use a stepladder:  Make sure that it is fully opened. Do not climb a closed stepladder.  Make sure that both sides of the stepladder are locked into place.  Ask someone to hold it for you, if possible.  Clearly mark and make sure that you can see:  Any grab bars or handrails.  First and last steps.  Where the edge of each step is.  Use tools that help you move around (mobility aids) if they are needed. These include:  Canes.  Walkers.  Scooters.  Crutches.  Turn on the lights when you go into a dark area. Replace any light bulbs as soon as they burn out.  Set up your furniture so you have a clear path. Avoid moving your furniture around.  If any of your floors are uneven, fix them.  If there are any pets around you, be aware of where they are.  Review your medicines with your doctor. Some medicines can make you feel dizzy. This can increase your chance of falling. Ask your doctor what other things that you can do to help prevent falls. This information is not intended to replace advice given to you by your health care provider. Make sure you discuss any questions you have with your health care provider. Document Released: 11/22/2008 Document Revised: 07/04/2015 Document Reviewed: 03/02/2014 Elsevier Interactive Patient Education  2017 [employer]d

## 2018-01-24 NOTE — Progress Notes (Signed)
Subjective:   Vanessa Zamora is a 77 y.o. female who presents for Medicare Annual (Subsequent) preventive examination.  Review of Systems:   Cardiac Risk Factors include: diabetes mellitus;dyslipidemia;hypertension, advanced age female > 10     Objective:     Vitals: BP 136/86 (BP Location: Left Arm, Patient Position: Sitting, Cuff Size: Normal)   Pulse 72   Temp (!) 97.3 F (36.3 C) (Oral)   Ht 5\' 5"  (1.651 m)   Wt 130 lb (59 kg)   BMI 21.63 kg/m   Body mass index is 21.63 kg/m.  Advanced Directives 01/24/2018 01/21/2017 06/30/2016 12/06/2014 11/19/2014  Does Patient Have a Medical Advance Directive? No Yes Yes Yes No  Type of Advance Directive - [place];Living will Living will [place] -  Does patient want to make changes to medical advance directive? Yes (MAU/Ambulatory/Procedural Areas - Information given) - - - -  Copy of [place] in Chart? - No - copy requested - No - copy requested -  Would patient like information on creating a medical advance directive? - - - - Yes - Scientist, clinical (histocompatibility and immunogenetics) given    Tobacco Social History   Tobacco Use  Smoking Status Former Smoker  . Packs/day: 0.25  . Years: 30.00  . Pack years: 7.50  . Types: Cigarettes  . Last attempt to quit: 2003  . Years since quitting: 16.9  Smokeless Tobacco Never Used  Tobacco Comment   quit 2003     Counseling given: Not Answered Comment: quit 2003   Clinical Intake:  Pre-visit preparation completed: Yes  Pain : 0-10 Pain Score: 3  Pain Type: Chronic pain Pain Location: Knee(right side arthritis pain) Pain Orientation: Right Pain Descriptors / Indicators: Aching Pain Onset: More than a month ago Pain Frequency: Constant     Nutritional Status: BMI of 19-24  Normal Diabetes: Yes CBG done?: No Did pt. bring in CBG monitor from home?: No   Nutrition Risk Assessment:  Has the patient had any N/V/D within the last 2  months?  No  Does the patient have any non-healing wounds?  No  Has the patient had any unintentional weight loss or weight gain?  No   Diabetes:  Is the patient diabetic?  Yes  If diabetic, was a CBG obtained today?  No  Did the patient bring in their glucometer from home?  No  How often do you monitor your CBG's? Daily.   Financial Strains and Diabetes Management:  Are you having any financial strains with the device, your supplies or your medication? No .  Does the patient want to be seen by Chronic Care Management for management of their diabetes?  No  Would the patient like to be referred to a Nutritionist or for Diabetic Management?  No   Diabetic Exams:  Diabetic Eye Exam: Completed 03/08/17.   Diabetic Foot Exam: Completed 07/28/17.   How often do you need to have someone help you when you read instructions, pamphlets, or other written materials from your doctor or pharmacy?: 1 - Never What is the last grade level you completed in school?: some college  Interpreter Needed?: No  Information entered by :: Clemetine Marker LPN  Past Medical History:  Diagnosis Date  . Arthritis    shoulders, knees right side  . Coronary artery disease   . Diabetes mellitus without complication (Grandfather)   . Hypercholesteremia   . Hypertension   . Myocardial infarction ([place]) 2005  . Seasonal  allergies   . Thyroid goiter   . Wears dentures    partial lower   Past Surgical History:  Procedure Laterality Date  . CARDIAC CATHETERIZATION  2005  . CATARACT EXTRACTION Left 10/2014  . CATARACT EXTRACTION W/PHACO Right 11/19/2014   Procedure: CATARACT EXTRACTION PHACO AND INTRAOCULAR LENS PLACEMENT ([place]);  Surgeon: Ronnell Freshwater, MD;  Location: [facility];  Service: Ophthalmology;  Laterality: Right;  DIABETIC - oral meds  . COLONOSCOPY WITH PROPOFOL N/A 06/30/2016   Procedure: COLONOSCOPY WITH PROPOFOL;  Surgeon: Lollie Sails, MD;  Location[facility] ENDOSCOPY;  Service:  Endoscopy;  Laterality: N/A;  . EYE SURGERY    . VAGINAL HYSTERECTOMY  02/10/1980   Family History  Problem Relation Age of Onset  . Stroke Mother   . Aneurysm Sister   . Heart disease Brother   . Hypertension Brother   . Goiter Maternal Grandmother   . Diabetes Maternal Grandmother   . COPD Brother   . Hypertension Brother   . Breast cancer Neg Hx    Social History   Socioeconomic History  . Marital status: Single    Spouse name: Not on file  . Number of children: 1  . Years of education: Not on file  . Highest education level: Not on file  Occupational History  . Occupation: Retired  Scientific laboratory technician  . Financial resource strain: Not hard at all  . Food insecurity:    Worry: Never true    Inability: Never true  . Transportation needs:    Medical: No    Non-medical: No  Tobacco Use  . Smoking status: Former Smoker    Packs/day: 0.25    Years: 30.00    Pack years: 7.50    Types: Cigarettes    Last attempt to quit: 2003    Years since quitting: 16.9  . Smokeless tobacco: Never Used  . Tobacco comment: quit 2003  Substance and Sexual Activity  . Alcohol use: No    Alcohol/week: 0.0 standard drinks  . Drug use: No  . Sexual activity: Not Currently  Lifestyle  . Physical activity:    Days per week: 5 days    Minutes per session: 60 min  . Stress: Not at all  Relationships  . Social connections:    Talks on phone: Twice a week    Gets together: Once a week    Attends religious service: More than 4 times per year    Active member of club or organization: Yes    Attends meetings of clubs or organizations: More than 4 times per year    Relationship status: Patient refused  Other Topics Concern  . Not on file  Social History Narrative  . Not on file    Outpatient Encounter Medications as of 01/24/2018  Medication Sig  . aspirin 81 MG tablet Take 1 tablet (81 mg total) by mouth daily.  . Calcium Carbonate-Vit D-Min (CALTRATE 600+D PLUS MINERALS) 600-800 MG-UNIT  TABS Take 1 tablet by mouth daily.  . carvedilol (COREG) 6.25 MG tablet Take 1 tablet (6.25 mg total) by mouth 2 (two) times daily.  . ferrous sulfate 325 (65 FE) MG tablet Take 1 tablet (325 mg total) by mouth every other day.  Marland Kitchen glucose blood (ONE TOUCH ULTRA TEST) test strip TEST ONCE DAILY  . hydrochlorothiazide (HYDRODIURIL) 25 MG tablet Take 1 tablet (25 mg total) by mouth daily.  Marland Kitchen MEGARED OMEGA-3 KRILL OIL 500 MG CAPS Take 1 capsule by mouth 2 (two) times  daily.  . metFORMIN (GLUCOPHAGE) 500 MG tablet TAKE 1 TABLET(500 MG) BY MOUTH TWICE DAILY  . Multiple Vitamins-Minerals (CENTRUM SILVER ULTRA WOMENS) TABS Take 1 tablet by mouth daily.  . pravastatin (PRAVACHOL) 40 MG tablet Take 1 tablet (40 mg total) by mouth daily.  . [DISCONTINUED] Multiple Minerals-Vitamins (CALCIUM-MAGNESIUM-ZINC-D3) TABS Take by mouth.   No facility-administered encounter medications on file as of 01/24/2018.     Activities of Daily Living In your present state of health, do you have any difficulty performing the following activities: 01/24/2018  Hearing? N  Comment pt declines hearing aids  Vision? N  Comment wears glasses  Difficulty concentrating or making decisions? N  Walking or climbing stairs? N  Dressing or bathing? N  Doing errands, shopping? N  Preparing Food and eating ? N  Using the Toilet? N  In the past six months, have you accidently leaked urine? N  Do you have problems with loss of bowel control? N  Managing your Medications? N  Managing your Finances? N  Housekeeping or managing your Housekeeping? N  Some recent data might be hidden    Patient Care Team: Juline Patch, MD as PCP - General (Family Medicine) Morayati, Lourdes Sledge, MD as Consulting Physician (Endocrinology) Yolonda Kida, MD as Consulting Physician (Cardiology)    Assessment:   This is a routine wellness examination for [place].  Exercise Activities and Dietary recommendations Current Exercise Habits: Home  exercise routine, Type of exercise: walking, Time (Minutes): 60, Frequency (Times/Week): 5, Weekly Exercise (Minutes/Week): 300, Intensity: Mild, Exercise limited by: orthopedic condition(s)  Goals    . DIET - INCREASE WATER INTAKE     Recommend 6-8 glasses of water per day    . Prevent falls     Recommend to remove items from the home that may cause slips or trips       Fall Risk Fall Risk  01/24/2018 01/26/2017 01/21/2017 07/28/2016 06/18/2015  Falls in the past year? 0 No No No No  Number falls in past yr: 0 - - - -   FALL RISK PREVENTION PERTAINING TO THE HOME:  Any stairs in or around the home WITH handrails? No  Home free of loose throw rugs in walkways, pet beds, electrical cords, etc? Yes  Adequate lighting in your home to reduce risk of falls? Yes   ASSISTIVE DEVICES UTILIZED TO PREVENT FALLS:  Life alert? Yes  Use of a cane, walker or w/c? No  Grab bars in the bathroom? Yes  Shower chair or bench in shower? No  Elevated toilet seat or a handicapped toilet? No   DME ORDERS:  DME order needed?  No   TIMED UP AND GO:  Was the test performed? Yes .  Length of time to ambulate 10 feet: 5 sec.   GAIT:  Appearance of gait: Gait stead-fast and without the use of an assistive device.  Education: Fall risk prevention has been discussed.  Intervention(s) required? No    Depression Screen PHQ 2/9 Scores 01/24/2018 01/26/2017 01/26/2017 01/21/2017  PHQ - 2 Score 0 0 0 0  PHQ- 9 Score - 0 - -     Cognitive Function     6CIT Screen 01/21/2017  What Year? 0 points  What month? 0 points  What time? 0 points  Count back from 20 0 points  Months in reverse 0 points  Repeat phrase 0 points  Total Score 0    Immunization History  Administered Date(s) Administered  . Influenza, High Dose  Seasonal PF 11/24/2016, 11/12/2017  . Influenza,inj,Quad PF,6+ Mos 12/06/2014, 11/12/2015  . Influenza-Unspecified 11/13/2013  . Pneumococcal Conjugate-13 12/18/2014  .  Pneumococcal Polysaccharide-23 05/13/2012  . Tdap 01/28/2016  . Zoster 01/09/2013    Qualifies for Shingles Vaccine? Yes  Zostavax completed 2014. Part 1 of Shingrix vaccine complete. Second dose scheduled for 02/08/18 at [facility]  Tdap: Up to date   Flu Vaccine: Up to date  Pneumococcal Vaccine: Up to date   Screening Tests Health Maintenance  Topic Date Due  . HEMOGLOBIN A1C  01/27/2018  . MAMMOGRAM  02/08/2018  . OPHTHALMOLOGY EXAM  03/08/2018  . FOOT EXAM  07/29/2018  . URINE MICROALBUMIN  07/29/2018  . TETANUS/TDAP  01/27/2026  . INFLUENZA VACCINE  Completed  . DEXA SCAN  Completed  . PNA vac Low Risk Adult  Completed    Cancer Screenings:  Colorectal Screening: Completed 06/30/16. No longer required.   Mammogram: Completed 01/21/17. Repeat every year; Scheduled for 02/11/18  Bone Density: Completed 05/17/2012. Results reflect OSTEOPENIA,  Repeat every 2 years. Ordered today. Pt provided with contact information and advised to call to schedule appt.   Lung Cancer Screening: (Low Dose CT Chest recommended if Age 34-80 years, 30 pack-year currently smoking OR have quit w/in 15years.) does not qualify.    Additional Screening:  Hepatitis C Screening: no longer required  Vision Screening: Recommended annual ophthalmology exams for early detection of glaucoma and other disorders of the eye. Is the patient up to date with their annual eye exam?  Yes  Who is the provider or what is the name of the office in which the pt attends annual eye exams? [place] Screening: Recommended annual dental exams for proper oral hygiene  Community Resource Referral:  CRR required this visit?  No      Plan:    I have personally reviewed and addressed the Medicare Annual Wellness questionnaire and have noted the following in the patient's chart:  A. Medical and social history B. Use of alcohol, tobacco or illicit drugs  C. Current medications and  supplements D. Functional ability and status E.  Nutritional status F.  Physical activity G. Advance directives H. List of other physicians I.  Hospitalizations, surgeries, and ER visits in previous 12 months J.  [place] such as hearing and vision if needed, cognitive and depression L. Referrals and appointments   In addition, I have reviewed and discussed with patient certain preventive protocols, quality metrics, and best practice recommendations. A written personalized care plan for preventive services as well as general preventive health recommendations were provided to patient.   Signed,  Clemetine Marker, LPN Nurse Health Advisor   Nurse Notes: pt doing well and appreciative of today's visit. Pt states her blood pressure is higher than it used to be since coming off of benicar but she does not tolerate a lot of medications and not sure what she would be able to take. Pt scheduled for follow up on Wed 01/26/18.

## 2018-01-26 ENCOUNTER — Encounter: Payer: Medicare HMO | Admitting: Family Medicine

## 2018-01-26 ENCOUNTER — Ambulatory Visit (INDEPENDENT_AMBULATORY_CARE_PROVIDER_SITE_OTHER): Payer: Medicare HMO | Admitting: Family Medicine

## 2018-01-26 ENCOUNTER — Encounter: Payer: Self-pay | Admitting: Family Medicine

## 2018-01-26 VITALS — BP 130/68 | HR 72 | Ht 65.0 in | Wt 128.0 lb

## 2018-01-26 DIAGNOSIS — E7800 Pure hypercholesterolemia, unspecified: Secondary | ICD-10-CM | POA: Diagnosis not present

## 2018-01-26 DIAGNOSIS — E119 Type 2 diabetes mellitus without complications: Secondary | ICD-10-CM

## 2018-01-26 DIAGNOSIS — D509 Iron deficiency anemia, unspecified: Secondary | ICD-10-CM | POA: Diagnosis not present

## 2018-01-26 DIAGNOSIS — I10 Essential (primary) hypertension: Secondary | ICD-10-CM

## 2018-01-26 DIAGNOSIS — E785 Hyperlipidemia, unspecified: Secondary | ICD-10-CM | POA: Diagnosis not present

## 2018-01-26 MED ORDER — HYDROCHLOROTHIAZIDE 25 MG PO TABS: 25.0000 mg | ORAL_TABLET | Freq: Every day | ORAL | 1 refills | Status: DC

## 2018-01-26 MED ORDER — CARVEDILOL 6.25 MG PO TABS: 6.2500 mg | ORAL_TABLET | Freq: Two times a day (BID) | ORAL | 1 refills | Status: DC

## 2018-01-26 MED ORDER — MEGARED OMEGA-3 KRILL OIL 500 MG PO CAPS: 1.0000 | ORAL_CAPSULE | Freq: Two times a day (BID) | ORAL | 11 refills | Status: DC

## 2018-01-26 MED ORDER — PRAVASTATIN SODIUM 40 MG PO TABS: 40.0000 mg | ORAL_TABLET | Freq: Every day | ORAL | 1 refills | Status: DC

## 2018-01-26 MED ORDER — METFORMIN HCL 500 MG PO TABS: ORAL_TABLET | ORAL | 1 refills | Status: DC

## 2018-01-26 MED ORDER — FERROUS SULFATE 325 (65 FE) MG PO TABS: 325.0000 mg | ORAL_TABLET | ORAL | 3 refills | Status: DC

## 2018-01-26 NOTE — Progress Notes (Signed)
Date:  01/26/2018   Name:  Vanessa Zamora   DOB:  June 05, 1940   MRN:  [id number]   Chief Complaint: Hypertension; Hyperlipidemia; Diabetes; and Anemia  Hypertension  This is a chronic problem. The current episode started more than 1 year ago. The problem is unchanged. The problem is controlled. Pertinent negatives include no anxiety, blurred vision, chest pain, headaches, malaise/fatigue, neck pain, orthopnea, palpitations, peripheral edema, PND, shortness of breath or sweats. There are no associated agents to hypertension. There are no known risk factors for coronary artery disease. Past treatments include beta blockers and diuretics. The current treatment provides moderate improvement. There are no compliance problems.  There is no history of angina, kidney disease, CAD/MI, CVA, heart failure, left ventricular hypertrophy, PVD or retinopathy. There is no history of chronic renal disease, a hypertension causing med or renovascular disease.  Hyperlipidemia  This is a chronic problem. The current episode started more than 1 year ago. The problem is controlled. Recent lipid tests were reviewed and are normal. She has no history of chronic renal disease. Factors aggravating her hyperlipidemia include beta blockers. Pertinent negatives include no chest pain, myalgias or shortness of breath. Current antihyperlipidemic treatment includes statins and diet change. The current treatment provides mild improvement of lipids. There are no compliance problems.  Risk factors for coronary artery disease include hypertension, dyslipidemia and diabetes mellitus.  Diabetes  She presents for her follow-up diabetic visit. She has type 2 diabetes mellitus. Her disease course has been stable. Pertinent negatives for hypoglycemia include no confusion, dizziness, headaches, hunger, mood changes, nervousness/anxiousness, pallor, seizures, sleepiness, speech difficulty, sweats or tremors. Pertinent negatives for diabetes  include no blurred vision, no chest pain, no fatigue, no foot paresthesias, no foot ulcerations, no polydipsia, no polyphagia, no polyuria, no visual change, no weakness and no weight loss. There are no hypoglycemic complications. Symptoms are stable. There are no diabetic complications. Pertinent negatives for diabetic complications include no CVA, PVD or retinopathy. Risk factors for coronary artery disease include diabetes mellitus, hypertension and dyslipidemia. Current diabetic treatment includes oral agent (monotherapy). She is compliant with treatment all of the time. Her weight is stable. She is following a generally healthy diet. She participates in exercise daily. Her breakfast blood glucose is taken between 8-9 am. Her breakfast blood glucose range is generally 90-110 mg/dl. An ACE inhibitor/angiotensin II receptor blocker is not being taken. Eye exam is current.  Anemia  Presents for follow-up visit. There has been no abdominal pain, anorexia, bruising/bleeding easily, confusion, fever, leg swelling, light-headedness, malaise/fatigue, pallor, palpitations, paresthesias or weight loss. Signs of blood loss that are not present include hematochezia, melena and vaginal bleeding. There is no history of chronic renal disease or heart failure. There are no compliance problems.     Review of Systems  Constitutional: Negative.  Negative for chills, fatigue, fever, malaise/fatigue, unexpected weight change and weight loss.  HENT: Negative for congestion, ear discharge, ear pain, rhinorrhea, sinus pressure, sneezing and sore throat.   Eyes: Negative for blurred vision, photophobia, pain, discharge, redness and itching.  Respiratory: Negative for cough, shortness of breath, wheezing and stridor.   Cardiovascular: Negative for chest pain, palpitations, orthopnea and PND.  Gastrointestinal: Negative for abdominal pain, anorexia, blood in stool, constipation, diarrhea, hematochezia, melena, nausea and  vomiting.  Endocrine: Negative for cold intolerance, heat intolerance, polydipsia, polyphagia and polyuria.  Genitourinary: Negative for dysuria, flank pain, frequency, hematuria, menstrual problem, pelvic pain, urgency, vaginal bleeding and vaginal discharge.  Musculoskeletal: Negative for  arthralgias, back pain, myalgias and neck pain.  Skin: Negative for pallor and rash.  Allergic/Immunologic: Negative for environmental allergies and food allergies.  Neurological: Negative for dizziness, tremors, seizures, speech difficulty, weakness, light-headedness, numbness, headaches and paresthesias.  Hematological: Negative for adenopathy. Does not bruise/bleed easily.  Psychiatric/Behavioral: Negative for confusion and dysphoric mood. The patient is not nervous/anxious.     Patient Active Problem List   Diagnosis Date Noted  . Myocardial infarction ([place]) 01/24/2018  . Coronary artery disease 01/24/2018  . Diabetes ([place]) 01/24/2018  . Heart murmur 01/24/2018  . Anemia 12/19/2015  . Osteoarthritis of left glenohumeral joint 06/20/2015  . Type 2 diabetes mellitus without complication, without long-term current use of insulin ([place]) 06/07/2014  . Hypercholesterolemia 06/07/2014  . Dyslipidemia 06/07/2014  . Hypertension 06/07/2014  . 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 Routine general medical examination at a health care facility 06/07/2014  . Screening for depression 06/07/2014    Allergies  Allergen Reactions  . Ace Inhibitors Swelling  . Altace  [Ramipril] Anaphylaxis  . Clonidine Swelling  . Losartan Potassium Other (See Comments)  . Levothyroxine Rash    Past Surgical History:  Procedure Laterality Date  . CARDIAC CATHETERIZATION  2005  . CATARACT EXTRACTION Left 10/2014  . CATARACT EXTRACTION W/PHACO Right 11/19/2014   Procedure: CATARACT EXTRACTION PHACO AND INTRAOCULAR LENS PLACEMENT ([place]);  Surgeon: Ronnell Freshwater, MD;  Location: [facility];  Service:  Ophthalmology;  Laterality: Right;  DIABETIC - oral meds  . COLONOSCOPY WITH PROPOFOL N/A 06/30/2016   Procedure: COLONOSCOPY WITH PROPOFOL;  Surgeon: Lollie Sails, MD;  Location[facility] ENDOSCOPY;  Service: Endoscopy;  Laterality: N/A;  . EYE SURGERY    . VAGINAL HYSTERECTOMY  02/10/1980    Social History   Tobacco Use  . Smoking status: Former Smoker    Packs/day: 0.25    Years: 30.00    Pack years: 7.50    Types: Cigarettes    Last attempt to quit: 2003    Years since quitting: 16.9  . Smokeless tobacco: Never Used  . Tobacco comment: quit 2003  Substance Use Topics  . Alcohol use: No    Alcohol/week: 0.0 standard drinks  . Drug use: No     Medication list has been reviewed and updated.  Current Meds  Medication Sig  . aspirin 81 MG tablet Take 1 tablet (81 mg total) by mouth daily.  . Calcium Carbonate-Vit D-Min (CALTRATE 600+D PLUS MINERALS) 600-800 MG-UNIT TABS Take 1 tablet by mouth daily.  . carvedilol (COREG) 6.25 MG tablet Take 1 tablet (6.25 mg total) by mouth 2 (two) times daily.  . ferrous sulfate 325 (65 FE) MG tablet Take 1 tablet (325 mg total) by mouth every other day.  Marland Kitchen glucose blood (ONE TOUCH ULTRA TEST) test strip TEST ONCE DAILY  . hydrochlorothiazide (HYDRODIURIL) 25 MG tablet Take 1 tablet (25 mg total) by mouth daily.  Marland Kitchen MEGARED OMEGA-3 KRILL OIL 500 MG CAPS Take 1 capsule by mouth 2 (two) times daily.  . metFORMIN (GLUCOPHAGE) 500 MG tablet TAKE 1 TABLET(500 MG) BY MOUTH TWICE DAILY  . Multiple Vitamins-Minerals (CENTRUM SILVER ULTRA WOMENS) TABS Take 1 tablet by mouth daily.  . pravastatin (PRAVACHOL) 40 MG tablet Take 1 tablet (40 mg total) by mouth daily.    PHQ 2/9 Scores 01/24/2018 01/26/2017 01/26/2017 01/21/2017  PHQ - 2 Score 0 0 0 0  PHQ- 9 Score - 0 - -    Physical Exam Vitals signs and nursing note reviewed.  Constitutional:  General: She is not in acute distress.    Appearance: She is normal weight. She is not diaphoretic.   HENT:     Head: Normocephalic and atraumatic.     Right Ear: Tympanic membrane, ear canal and external ear normal.     Left Ear: Tympanic membrane, ear canal and external ear normal.     Nose: Nose normal.     Mouth/Throat:     Mouth: Mucous membranes are moist.  Eyes:     General:        Right eye: No discharge.        Left eye: No discharge.     Conjunctiva/sclera: Conjunctivae normal.     Pupils: Pupils are equal, round, and reactive to light.  Neck:     Musculoskeletal: Normal range of motion and neck supple.     Thyroid: No thyromegaly.     Vascular: No JVD.  Cardiovascular:     Rate and Rhythm: Normal rate and regular rhythm.     Heart sounds: Normal heart sounds, S1 normal and S2 normal. No murmur. No systolic murmur. No diastolic murmur. No friction rub. No gallop. No S3 or S4 sounds.   Pulmonary:     Effort: Pulmonary effort is normal.     Breath sounds: Normal breath sounds.  Chest:     Chest wall: No deformity.     Breasts: Breasts are asymmetrical.        Right: Normal. No mass.        Left: Normal. No mass.     Comments: Manubrium prominence Abdominal:     General: Bowel sounds are normal.     Palpations: Abdomen is soft. There is no mass.     Tenderness: There is no abdominal tenderness. There is no guarding.  Musculoskeletal: Normal range of motion.     Right lower leg: No edema.     Left lower leg: No edema.  Lymphadenopathy:     Cervical: No cervical adenopathy.     Upper Body:     Right upper body: No axillary adenopathy.     Left upper body: No axillary adenopathy.  Skin:    General: Skin is warm and dry.     Findings: No bruising.  Neurological:     Mental Status: She is alert.     Deep Tendon Reflexes: Reflexes are normal and symmetric.     BP 130/68   Pulse 72   Ht 5\' 5"  (1.651 m)   Wt 128 lb (58.1 kg)   BMI 21.30 kg/m   Assessment and Plan: 1. Dyslipidemia Chronic. Controlled.  Continue pravastatin 10 mg daily check lipid panel. -  pravastatin (PRAVACHOL) 40 MG tablet; Take 1 tablet (40 mg total) by mouth daily.  Dispense: 90 tablet; Refill: 1 - Lipid panel  2. Hypercholesteremia Chronic.  Continue pravastatin and omega-3. - pravastatin (PRAVACHOL) 40 MG tablet; Take 1 tablet (40 mg total) by mouth daily.  Dispense: 90 tablet; Refill: 1 - MEGARED OMEGA-3 KRILL OIL 500 MG CAPS; Take 1 capsule by mouth 2 (two) times daily.  Dispense: 60 capsule; Refill: 11 - Lipid panel  3. Essential (primary) hypertension Chronic.  Controlled.  Continue carvedilol 6.25 twice daily and hydrochlorothiazide 25 mg daily.  Will check renal function panel. - carvedilol (COREG) 6.25 MG tablet; Take 1 tablet (6.25 mg total) by mouth 2 (two) times daily.  Dispense: 180 tablet; Refill: 1 - hydrochlorothiazide (HYDRODIURIL) 25 MG tablet; Take 1 tablet (25 mg total) by mouth daily.  Dispense: 90 tablet; Refill: 1 - Renal Function Panel  4. Iron deficiency anemia, unspecified iron deficiency anemia type Chronic.  Controlled.  Continue ferrous sulfate 325 once a day.  Check CBC to follow hemoglobin status. - ferrous sulfate 325 (65 FE) MG tablet; Take 1 tablet (325 mg total) by mouth every other day.  Dispense: 100 tablet; Refill: 3 - CBC  5. Type 2 diabetes mellitus without complication, without long-term current use of insulin (HCC) Chronic.  Controlled.  Continue metformin 500 mg twice a day.  Will check renal function panel and A1c. - metFORMIN (GLUCOPHAGE) 500 MG tablet; TAKE 1 TABLET(500 MG) BY MOUTH TWICE DAILY  Dispense: 180 tablet; Refill: 1 - Renal Function Panel - Hemoglobin A1c

## 2018-01-27 LAB — RENAL FUNCTION PANEL
Albumin: 4.7 g/dL (ref 3.5–4.8)
BUN/Creatinine Ratio: 22 (ref 12–28)
BUN: 20 mg/dL (ref 8–27)
CO2: 25 mmol/L (ref 20–29)
Calcium: 10.1 mg/dL (ref 8.7–10.3)
Chloride: 101 mmol/L (ref 96–106)
Creatinine, Ser: 0.9 mg/dL (ref 0.57–1.00)
GFR calc non Af Amer: 61 mL/min/{1.73_m2} (ref >59)
GFR, EST AFRICAN AMERICAN: 70 mL/min/{1.73_m2} (ref >59)
Glucose: 99 mg/dL (ref 65–99)
Phosphorus: 4.3 mg/dL (ref 2.5–4.5)
Potassium: 3.8 mmol/L (ref 3.5–5.2)
Sodium: 145 mmol/L — ABNORMAL HIGH (ref 134–144)

## 2018-01-27 LAB — CBC
Hematocrit: 38.9 % (ref 34.0–46.6)
Hemoglobin: 11.9 g/dL (ref 11.1–15.9)
MCH: 23.7 pg — ABNORMAL LOW (ref 26.6–33.0)
MCHC: 30.6 g/dL — ABNORMAL LOW (ref 31.5–35.7)
MCV: 78 fL — ABNORMAL LOW (ref 79–97)
PLATELETS: 292 10*3/uL (ref 150–450)
RBC: 5.02 x10E6/uL (ref 3.77–5.28)
RDW: 15.4 % (ref 12.3–15.4)
WBC: 4.6 10*3/uL (ref 3.4–10.8)

## 2018-01-27 LAB — HEMOGLOBIN A1C
Est. average glucose Bld gHb Est-mCnc: 137 mg/dL
Hgb A1c MFr Bld: 6.4 % — ABNORMAL HIGH (ref 4.8–5.6)

## 2018-01-27 LAB — LIPID PANEL
Chol/HDL Ratio: 2.2 ratio (ref 0.0–4.4)
Cholesterol, Total: 196 mg/dL (ref 100–199)
HDL: 91 mg/dL (ref >39)
LDL Calculated: 94 mg/dL (ref 0–99)
Triglycerides: 57 mg/dL (ref 0–149)
VLDL CHOLESTEROL CAL: 11 mg/dL (ref 5–40)

## 2018-02-11 ENCOUNTER — Ambulatory Visit
Admission: RE | Admit: 2018-02-11 | Discharge: 2018-02-11 | Disposition: A | Discharge status: Home / Self Care | Payer: Medicare HMO | Source: Ambulatory Visit | Attending: Family Medicine | Admitting: Family Medicine

## 2018-02-11 DIAGNOSIS — Z1231 Encounter for screening mammogram for malignant neoplasm of breast: Secondary | ICD-10-CM | POA: Diagnosis not present

## 2018-03-16 ENCOUNTER — Ambulatory Visit
Admission: RE | Admit: 2018-03-16 | Discharge: 2018-03-16 | Disposition: A | Discharge status: Home / Self Care | Payer: Medicare HMO | Source: Ambulatory Visit | Attending: Family Medicine | Admitting: Family Medicine

## 2018-03-16 ENCOUNTER — Encounter (INDEPENDENT_AMBULATORY_CARE_PROVIDER_SITE_OTHER): Payer: Self-pay

## 2018-03-16 DIAGNOSIS — Z780 Asymptomatic menopausal state: Secondary | ICD-10-CM | POA: Diagnosis not present

## 2018-03-16 DIAGNOSIS — M8580 Other specified disorders of bone density and structure, unspecified site: Secondary | ICD-10-CM | POA: Insufficient documentation

## 2018-03-16 DIAGNOSIS — E2839 Other primary ovarian failure: Secondary | ICD-10-CM | POA: Diagnosis not present

## 2018-03-29 ENCOUNTER — Other Ambulatory Visit: Payer: Self-pay | Admitting: Family Medicine

## 2018-03-29 DIAGNOSIS — I10 Essential (primary) hypertension: Secondary | ICD-10-CM

## 2018-05-19 ENCOUNTER — Encounter: Payer: Self-pay | Admitting: Family Medicine

## 2018-05-19 ENCOUNTER — Ambulatory Visit: Payer: Medicare HMO | Admitting: Family Medicine

## 2018-05-19 ENCOUNTER — Other Ambulatory Visit: Payer: Self-pay

## 2018-05-19 VITALS — BP 120/70 | HR 68 | Temp 98.3°F | Ht 65.0 in | Wt 132.0 lb

## 2018-05-19 DIAGNOSIS — I10 Essential (primary) hypertension: Secondary | ICD-10-CM

## 2018-05-19 DIAGNOSIS — E119 Type 2 diabetes mellitus without complications: Secondary | ICD-10-CM | POA: Diagnosis not present

## 2018-05-19 MED ORDER — OLMESARTAN MEDOXOMIL 5 MG PO TABS: 5.0000 mg | ORAL_TABLET | Freq: Every day | ORAL | 1 refills | Status: DC

## 2018-05-19 NOTE — Progress Notes (Signed)
Date:  05/19/2018   Name:  Vanessa Zamora   DOB:  [birth year]   MRN:  [id number]   Chief Complaint: Diabetes  Diabetes  She presents for her follow-up diabetic visit. She has type 2 diabetes mellitus. Her disease course has been stable. There are no hypoglycemic associated symptoms. Pertinent negatives for hypoglycemia include no dizziness, headaches or nervousness/anxiousness. Associated symptoms include blurred vision. Pertinent negatives for diabetes include no chest pain, no fatigue, no foot paresthesias, no foot ulcerations, no polydipsia, no polyphagia, no polyuria, no visual change, no weakness and no weight loss. There are no hypoglycemic complications. Symptoms are stable. There are no diabetic complications. Pertinent negatives for diabetic complications include no autonomic neuropathy, CVA, heart disease, impotence, nephropathy, peripheral neuropathy, PVD or retinopathy. Risk factors for coronary artery disease include post-menopausal and diabetes mellitus. Current diabetic treatment includes oral agent (monotherapy) (metformen). She is compliant with treatment all of the time. Her weight is stable. She is following a generally healthy diet. Meal planning includes avoidance of concentrated sweets and carbohydrate counting. She participates in exercise daily. There is no change in her home blood glucose trend. Her breakfast blood glucose is taken between 8-9 am. Her breakfast blood glucose range is generally 90-110 mg/dl. An ACE inhibitor/angiotensin II receptor blocker is being taken (will be resume). Eye exam is current.    Review of Systems  Constitutional: Negative.  Negative for chills, fatigue, fever, unexpected weight change and weight loss.  HENT: Negative for congestion, ear discharge, ear pain, rhinorrhea, sinus pressure, sneezing and sore throat.   Eyes: Positive for blurred vision. Negative for photophobia, pain, discharge, redness and itching.  Respiratory: Negative for cough,  shortness of breath, wheezing and stridor.   Cardiovascular: Negative for chest pain.  Gastrointestinal: Negative for abdominal pain, blood in stool, constipation, diarrhea, nausea and vomiting.  Endocrine: Negative for cold intolerance, heat intolerance, polydipsia, polyphagia and polyuria.  Genitourinary: Negative for dysuria, flank pain, frequency, hematuria, impotence, menstrual problem, pelvic pain, urgency, vaginal bleeding and vaginal discharge.  Musculoskeletal: Negative for arthralgias, back pain and myalgias.  Skin: Negative for rash.  Allergic/Immunologic: Negative for environmental allergies and food allergies.  Neurological: Negative for dizziness, weakness, light-headedness, numbness and headaches.  Hematological: Negative for adenopathy. Does not bruise/bleed easily.  Psychiatric/Behavioral: Negative for dysphoric mood. The patient is not nervous/anxious.     Patient Active Problem List   Diagnosis Date Noted  . Myocardial infarction ([place]) 01/24/2018  . Coronary artery disease 01/24/2018  . Diabetes ([place]) 01/24/2018  . Heart murmur 01/24/2018  . Anemia 12/19/2015  . Osteoarthritis of left glenohumeral joint 06/20/2015  . Type 2 diabetes mellitus without complication, without long-term current use of insulin ([place]) 06/07/2014  . Hypercholesterolemia 06/07/2014  . Dyslipidemia 06/07/2014  . Hypertension 06/07/2014  . 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 Routine general medical examination at a health care facility 06/07/2014  . Screening for depression 06/07/2014    Allergies  Allergen Reactions  . Ace Inhibitors Swelling  . Altace  [Ramipril] Anaphylaxis  . Clonidine Swelling  . Losartan Potassium Other (See Comments)  . Levothyroxine Rash    Past Surgical History:  Procedure Laterality Date  . CARDIAC CATHETERIZATION  2005  . CATARACT EXTRACTION Left 10/2014  . CATARACT EXTRACTION W/PHACO Right 11/19/2014   Procedure: CATARACT EXTRACTION PHACO AND INTRAOCULAR LENS  PLACEMENT ([place]);  Surgeon: Ronnell Freshwater, MD;  Location: [place];  Service: Ophthalmology;  Laterality: Right;  DIABETIC - oral meds  . COLONOSCOPY WITH PROPOFOL N/A  06/30/2016   Procedure: COLONOSCOPY WITH PROPOFOL;  Surgeon: Lollie Sails, MD;  [facility] ENDOSCOPY;  Service: Endoscopy;  Laterality: N/A;  . EYE SURGERY    . VAGINAL HYSTERECTOMY  02/10/1980    Social History   Tobacco Use  . Smoking status: Former Smoker    Packs/day: 0.25    Years: 30.00    Pack years: 7.50    Types: Cigarettes    Last attempt to quit: 2003    Years since quitting: 17.2  . Smokeless tobacco: Never Used  . Tobacco comment: quit 2003  Substance Use Topics  . Alcohol use: No    Alcohol/week: 0.0 standard drinks  . Drug use: No     Medication list has been reviewed and updated.  Current Meds  Medication Sig  . aspirin 81 MG tablet Take 1 tablet (81 mg total) by mouth daily.  . Calcium Carbonate-Vit D-Min (CALTRATE 600+D PLUS MINERALS) 600-800 MG-UNIT TABS Take 1 tablet by mouth daily.  . carvedilol (COREG) 6.25 MG tablet Take 1 tablet (6.25 mg total) by mouth 2 (two) times daily.  . ferrous sulfate 325 (65 FE) MG tablet Take 1 tablet (325 mg total) by mouth every other day. (Patient taking differently: Take 325 mg by mouth every other day. No otc iron- needs prescription dosing)  . glucose blood (ONE TOUCH ULTRA TEST) test strip TEST ONCE DAILY  . hydrochlorothiazide (HYDRODIURIL) 25 MG tablet TAKE 1 TABLET BY MOUTH DAILY  . MEGARED OMEGA-3 KRILL OIL 500 MG CAPS Take 1 capsule by mouth 2 (two) times daily.  . metFORMIN (GLUCOPHAGE) 500 MG tablet TAKE 1 TABLET(500 MG) BY MOUTH TWICE DAILY  . Multiple Vitamins-Minerals (CENTRUM SILVER ULTRA WOMENS) TABS Take 1 tablet by mouth daily.  . pravastatin (PRAVACHOL) 40 MG tablet Take 1 tablet (40 mg total) by mouth daily.    PHQ 2/9 Scores 05/19/2018 01/24/2018 01/26/2017 01/26/2017  PHQ - 2 Score 0 0 0 0  PHQ- 9 Score 0 -  0 -    BP Readings from Last 3 Encounters:  05/19/18 120/70  01/26/18 130/68  01/24/18 132/84    Physical Exam Vitals signs and nursing note reviewed.  Constitutional:      General: She is not in acute distress.    Appearance: She is not diaphoretic.  HENT:     Head: Normocephalic and atraumatic.     Right Ear: External ear normal.     Left Ear: External ear normal.     Nose: Nose normal.  Eyes:     General:        Right eye: No discharge.        Left eye: No discharge.     Conjunctiva/sclera: Conjunctivae normal.     Pupils: Pupils are equal, round, and reactive to light.  Neck:     Musculoskeletal: Normal range of motion and neck supple.     Thyroid: No thyromegaly.     Vascular: No JVD.  Cardiovascular:     Rate and Rhythm: Normal rate and regular rhythm.     Heart sounds: Normal heart sounds. No murmur. No friction rub. No gallop.   Pulmonary:     Effort: Pulmonary effort is normal.     Breath sounds: Normal breath sounds.  Abdominal:     General: Bowel sounds are normal.     Palpations: Abdomen is soft. There is no mass.     Tenderness: There is no abdominal tenderness. There is no guarding.  Musculoskeletal: Normal range of motion.  Lymphadenopathy:     Cervical: No cervical adenopathy.  Skin:    General: Skin is warm and dry.  Neurological:     Mental Status: She is alert.     Deep Tendon Reflexes: Reflexes are normal and symmetric.     Wt Readings from Last 3 Encounters:  05/19/18 132 lb (59.9 kg)  01/26/18 128 lb (58.1 kg)  01/24/18 130 lb (59 kg)    BP 120/70   Pulse 68   Temp 98.3 F (36.8 C) (Oral)   Ht 5\' 5"  (1.651 m)   Wt 132 lb (59.9 kg)   BMI 21.97 kg/m   Assessment and Plan:  1. Type 2 diabetes mellitus without complication, without long-term current use of insulin (HCC) Chronic.  Controlled.  Patient is prediabetic for which we are following her for.  We will check her A1c and renal function panel and we will continue/resume  olmesartan 5 mg once a day for blood pressure and for protection. - HgB A1c - Renal Function Panel - olmesartan (BENICAR) 5 MG tablet; Take 1 tablet (5 mg total) by mouth daily.  Dispense: 90 tablet; Refill: 1  2. Essential hypertension Chronic.  Controlled.  Will resume olmesartan 5 mg once a day for nephro protection.  In the meantime she will continue the medications for hypertension. - Renal Function Panel - olmesartan (BENICAR) 5 MG tablet; Take 1 tablet (5 mg total) by mouth daily.  Dispense: 90 tablet; Refill: 1

## 2018-05-20 LAB — RENAL FUNCTION PANEL
Albumin: 4.5 g/dL (ref 3.7–4.7)
BUN/Creatinine Ratio: 21 (ref 12–28)
BUN: 18 mg/dL (ref 8–27)
CO2: 26 mmol/L (ref 20–29)
Calcium: 9.7 mg/dL (ref 8.7–10.3)
Chloride: 103 mmol/L (ref 96–106)
Creatinine, Ser: 0.84 mg/dL (ref 0.57–1.00)
GFR calc Af Amer: 76 mL/min/{1.73_m2} (ref >59)
GFR calc non Af Amer: 66 mL/min/{1.73_m2} (ref >59)
Glucose: 101 mg/dL — ABNORMAL HIGH (ref 65–99)
Phosphorus: 3.5 mg/dL (ref 3.0–4.3)
Potassium: 4.1 mmol/L (ref 3.5–5.2)
Sodium: 143 mmol/L (ref 134–144)

## 2018-05-20 LAB — HEMOGLOBIN A1C
Est. average glucose Bld gHb Est-mCnc: 134 mg/dL
Hgb A1c MFr Bld: 6.3 % — ABNORMAL HIGH (ref 4.8–5.6)

## 2018-05-23 DIAGNOSIS — E119 Type 2 diabetes mellitus without complications: Secondary | ICD-10-CM | POA: Diagnosis not present

## 2018-05-23 DIAGNOSIS — B351 Tinea unguium: Secondary | ICD-10-CM | POA: Diagnosis not present

## 2018-05-23 DIAGNOSIS — L851 Acquired keratosis [keratoderma] palmaris et plantaris: Secondary | ICD-10-CM | POA: Diagnosis not present

## 2018-05-24 DIAGNOSIS — I10 Essential (primary) hypertension: Secondary | ICD-10-CM | POA: Diagnosis not present

## 2018-05-24 DIAGNOSIS — D509 Iron deficiency anemia, unspecified: Secondary | ICD-10-CM | POA: Diagnosis not present

## 2018-05-24 DIAGNOSIS — E049 Nontoxic goiter, unspecified: Secondary | ICD-10-CM | POA: Diagnosis not present

## 2018-05-24 DIAGNOSIS — E0590 Thyrotoxicosis, unspecified without thyrotoxic crisis or storm: Secondary | ICD-10-CM | POA: Diagnosis not present

## 2018-05-24 DIAGNOSIS — E785 Hyperlipidemia, unspecified: Secondary | ICD-10-CM | POA: Diagnosis not present

## 2018-05-24 DIAGNOSIS — E118 Type 2 diabetes mellitus with unspecified complications: Secondary | ICD-10-CM | POA: Diagnosis not present

## 2018-05-24 DIAGNOSIS — L42 Pityriasis rosea: Secondary | ICD-10-CM | POA: Diagnosis not present

## 2018-05-31 DIAGNOSIS — E0590 Thyrotoxicosis, unspecified without thyrotoxic crisis or storm: Secondary | ICD-10-CM | POA: Diagnosis not present

## 2018-05-31 DIAGNOSIS — E049 Nontoxic goiter, unspecified: Secondary | ICD-10-CM | POA: Diagnosis not present

## 2018-05-31 DIAGNOSIS — D509 Iron deficiency anemia, unspecified: Secondary | ICD-10-CM | POA: Diagnosis not present

## 2018-05-31 DIAGNOSIS — E118 Type 2 diabetes mellitus with unspecified complications: Secondary | ICD-10-CM | POA: Diagnosis not present

## 2018-05-31 DIAGNOSIS — E785 Hyperlipidemia, unspecified: Secondary | ICD-10-CM | POA: Diagnosis not present

## 2018-05-31 DIAGNOSIS — L42 Pityriasis rosea: Secondary | ICD-10-CM | POA: Diagnosis not present

## 2018-05-31 DIAGNOSIS — I10 Essential (primary) hypertension: Secondary | ICD-10-CM | POA: Diagnosis not present

## 2018-06-09 DIAGNOSIS — E118 Type 2 diabetes mellitus with unspecified complications: Secondary | ICD-10-CM | POA: Diagnosis not present

## 2018-06-09 DIAGNOSIS — E049 Nontoxic goiter, unspecified: Secondary | ICD-10-CM | POA: Diagnosis not present

## 2018-06-09 DIAGNOSIS — D509 Iron deficiency anemia, unspecified: Secondary | ICD-10-CM | POA: Diagnosis not present

## 2018-06-09 DIAGNOSIS — E041 Nontoxic single thyroid nodule: Secondary | ICD-10-CM | POA: Diagnosis not present

## 2018-06-09 DIAGNOSIS — E785 Hyperlipidemia, unspecified: Secondary | ICD-10-CM | POA: Diagnosis not present

## 2018-06-09 DIAGNOSIS — E0590 Thyrotoxicosis, unspecified without thyrotoxic crisis or storm: Secondary | ICD-10-CM | POA: Diagnosis not present

## 2018-06-09 DIAGNOSIS — I10 Essential (primary) hypertension: Secondary | ICD-10-CM | POA: Diagnosis not present

## 2018-06-09 DIAGNOSIS — L42 Pityriasis rosea: Secondary | ICD-10-CM | POA: Diagnosis not present

## 2018-06-15 DIAGNOSIS — E118 Type 2 diabetes mellitus with unspecified complications: Secondary | ICD-10-CM | POA: Diagnosis not present

## 2018-06-15 DIAGNOSIS — E041 Nontoxic single thyroid nodule: Secondary | ICD-10-CM | POA: Diagnosis not present

## 2018-06-15 DIAGNOSIS — D509 Iron deficiency anemia, unspecified: Secondary | ICD-10-CM | POA: Diagnosis not present

## 2018-06-15 DIAGNOSIS — I10 Essential (primary) hypertension: Secondary | ICD-10-CM | POA: Diagnosis not present

## 2018-06-15 DIAGNOSIS — L42 Pityriasis rosea: Secondary | ICD-10-CM | POA: Diagnosis not present

## 2018-06-15 DIAGNOSIS — E785 Hyperlipidemia, unspecified: Secondary | ICD-10-CM | POA: Diagnosis not present

## 2018-06-15 DIAGNOSIS — E049 Nontoxic goiter, unspecified: Secondary | ICD-10-CM | POA: Diagnosis not present

## 2018-06-15 DIAGNOSIS — E0590 Thyrotoxicosis, unspecified without thyrotoxic crisis or storm: Secondary | ICD-10-CM | POA: Diagnosis not present

## 2018-06-23 DIAGNOSIS — I10 Essential (primary) hypertension: Secondary | ICD-10-CM | POA: Diagnosis not present

## 2018-06-23 DIAGNOSIS — E0590 Thyrotoxicosis, unspecified without thyrotoxic crisis or storm: Secondary | ICD-10-CM | POA: Diagnosis not present

## 2018-06-23 DIAGNOSIS — D509 Iron deficiency anemia, unspecified: Secondary | ICD-10-CM | POA: Diagnosis not present

## 2018-06-23 DIAGNOSIS — L42 Pityriasis rosea: Secondary | ICD-10-CM | POA: Diagnosis not present

## 2018-06-23 DIAGNOSIS — E785 Hyperlipidemia, unspecified: Secondary | ICD-10-CM | POA: Diagnosis not present

## 2018-06-23 DIAGNOSIS — E049 Nontoxic goiter, unspecified: Secondary | ICD-10-CM | POA: Diagnosis not present

## 2018-06-23 DIAGNOSIS — E039 Hypothyroidism, unspecified: Secondary | ICD-10-CM | POA: Diagnosis not present

## 2018-06-23 DIAGNOSIS — E118 Type 2 diabetes mellitus with unspecified complications: Secondary | ICD-10-CM | POA: Diagnosis not present

## 2018-07-21 ENCOUNTER — Other Ambulatory Visit: Payer: Self-pay | Admitting: Family Medicine

## 2018-07-21 DIAGNOSIS — E119 Type 2 diabetes mellitus without complications: Secondary | ICD-10-CM

## 2018-08-31 ENCOUNTER — Other Ambulatory Visit: Payer: Self-pay | Admitting: Family Medicine

## 2018-08-31 DIAGNOSIS — E119 Type 2 diabetes mellitus without complications: Secondary | ICD-10-CM

## 2018-09-01 DIAGNOSIS — R69 Illness, unspecified: Secondary | ICD-10-CM | POA: Diagnosis not present

## 2018-09-16 ENCOUNTER — Other Ambulatory Visit: Payer: Self-pay

## 2018-09-16 ENCOUNTER — Encounter: Payer: Self-pay | Admitting: Family Medicine

## 2018-09-16 ENCOUNTER — Ambulatory Visit: Payer: Medicare HMO | Admitting: Family Medicine

## 2018-09-16 DIAGNOSIS — E7800 Pure hypercholesterolemia, unspecified: Secondary | ICD-10-CM | POA: Diagnosis not present

## 2018-09-16 DIAGNOSIS — E119 Type 2 diabetes mellitus without complications: Secondary | ICD-10-CM | POA: Diagnosis not present

## 2018-09-16 DIAGNOSIS — D509 Iron deficiency anemia, unspecified: Secondary | ICD-10-CM

## 2018-09-16 DIAGNOSIS — E785 Hyperlipidemia, unspecified: Secondary | ICD-10-CM | POA: Diagnosis not present

## 2018-09-16 DIAGNOSIS — I10 Essential (primary) hypertension: Secondary | ICD-10-CM

## 2018-09-16 MED ORDER — FERROUS SULFATE 325 (65 FE) MG PO TABS: 325.0000 mg | ORAL_TABLET | ORAL | 1 refills | Status: DC

## 2018-09-16 MED ORDER — OLMESARTAN MEDOXOMIL 5 MG PO TABS: 5.0000 mg | ORAL_TABLET | Freq: Every day | ORAL | 1 refills | Status: DC

## 2018-09-16 MED ORDER — CARVEDILOL 6.25 MG PO TABS: 6.2500 mg | ORAL_TABLET | Freq: Two times a day (BID) | ORAL | 1 refills | Status: DC

## 2018-09-16 MED ORDER — PRAVASTATIN SODIUM 40 MG PO TABS: 40.0000 mg | ORAL_TABLET | Freq: Every day | ORAL | 1 refills | Status: DC

## 2018-09-16 MED ORDER — METFORMIN HCL 500 MG PO TABS: ORAL_TABLET | ORAL | 1 refills | Status: DC

## 2018-09-16 MED ORDER — HYDROCHLOROTHIAZIDE 25 MG PO TABS: 25.0000 mg | ORAL_TABLET | Freq: Every day | ORAL | 1 refills | Status: DC

## 2018-09-16 NOTE — Progress Notes (Signed)
Date:  09/16/2018   Name:  Vanessa Zamora   DOB:  03-16-1940   MRN:  [id number]   Chief Complaint: Diabetes, Hypertension, and Hyperlipidemia  Diabetes She presents for her follow-up diabetic visit. She has type 2 diabetes mellitus. Her disease course has been stable. Pertinent negatives for hypoglycemia include no confusion, dizziness, headaches, hunger, mood changes, nervousness/anxiousness, pallor, seizures, sleepiness, speech difficulty, sweats or tremors. Pertinent negatives for diabetes include no blurred vision, no chest pain, no fatigue, no foot paresthesias, no foot ulcerations, no polydipsia, no polyphagia, no polyuria, no visual change, no weakness and no weight loss. There are no hypoglycemic complications. Symptoms are stable. There are no diabetic complications. Pertinent negatives for diabetic complications include no CVA, PVD or retinopathy. Risk factors for coronary artery disease include dyslipidemia and hypertension. Current diabetic treatment includes oral agent (monotherapy). She is following a generally healthy diet. Meal planning includes avoidance of concentrated sweets and carbohydrate counting. She participates in exercise intermittently. There is no change in her home blood glucose trend. Her breakfast blood glucose is taken between 8-9 am. Her breakfast blood glucose range is generally 90-110 mg/dl. An ACE inhibitor/angiotensin II receptor blocker is being taken.  Hypertension This is a chronic problem. The current episode started more than 1 year ago. The problem has been gradually improving since onset. The problem is controlled. Pertinent negatives include no anxiety, blurred vision, chest pain, headaches, malaise/fatigue, neck pain, orthopnea, palpitations, peripheral edema, PND, shortness of breath or sweats. Risk factors for coronary artery disease include diabetes mellitus and dyslipidemia. Past treatments include angiotensin blockers, beta blockers and diuretics. The  current treatment provides moderate improvement. There are no compliance problems.  There is no history of angina, kidney disease, CAD/MI, CVA, heart failure, left ventricular hypertrophy, PVD or retinopathy. There is no history of chronic renal disease, a hypertension causing med or renovascular disease.  Hyperlipidemia This is a chronic problem. The current episode started more than 1 year ago. The problem is controlled. Recent lipid tests were reviewed and are normal. She has no history of chronic renal disease. Factors aggravating her hyperlipidemia include thiazides. Pertinent negatives include no chest pain, focal sensory loss, focal weakness, leg pain, myalgias or shortness of breath. Current antihyperlipidemic treatment includes statins. The current treatment provides moderate improvement of lipids. There are no compliance problems.  Risk factors for coronary artery disease include dyslipidemia.  Anemia Presents for follow-up visit. There has been no abdominal pain, anorexia, bruising/bleeding easily, confusion, fever, light-headedness, malaise/fatigue, pallor, palpitations, paresthesias, pica or weight loss. Signs of blood loss that are not present include hematemesis, hematochezia, melena, menorrhagia and vaginal bleeding. There is no history of chronic renal disease or heart failure. There are no compliance problems.     Review of Systems  Constitutional: Negative.  Negative for chills, fatigue, fever, malaise/fatigue, unexpected weight change and weight loss.  HENT: Negative for congestion, ear discharge, ear pain, rhinorrhea, sinus pressure, sneezing and sore throat.   Eyes: Negative for blurred vision, photophobia, pain, discharge, redness and itching.  Respiratory: Negative for cough, shortness of breath, wheezing and stridor.   Cardiovascular: Negative for chest pain, palpitations, orthopnea and PND.  Gastrointestinal: Negative for abdominal pain, anorexia, blood in stool, constipation,  diarrhea, hematemesis, hematochezia, melena, nausea and vomiting.  Endocrine: Negative for cold intolerance, heat intolerance, polydipsia, polyphagia and polyuria.  Genitourinary: Negative for dysuria, flank pain, frequency, hematuria, menorrhagia, menstrual problem, pelvic pain, urgency, vaginal bleeding and vaginal discharge.  Musculoskeletal: Negative for arthralgias, back pain,  myalgias and neck pain.  Skin: Negative for pallor and rash.  Allergic/Immunologic: Negative for environmental allergies and food allergies.  Neurological: Negative for dizziness, tremors, focal weakness, seizures, speech difficulty, weakness, light-headedness, numbness, headaches and paresthesias.  Hematological: Negative for adenopathy. Does not bruise/bleed easily.  Psychiatric/Behavioral: Negative for confusion and dysphoric mood. The patient is not nervous/anxious.     Patient Active Problem List   Diagnosis Date Noted   Myocardial infarction [facility]) 01/24/2018   Coronary artery disease 01/24/2018   Diabetes ([place]) 01/24/2018   Heart murmur 01/24/2018   Anemia 12/19/2015   Osteoarthritis of left glenohumeral joint 06/20/2015   Type 2 diabetes mellitus without complication, without long-term current use of insulin ([place]) 06/07/2014   Hypercholesterolemia 06/07/2014   Dyslipidemia 06/07/2014   Hypertension 06/07/2014   Thyrotoxicosis 06/07/2014   Routine general medical examination at a health care facility 06/07/2014   Screening for depression 06/07/2014    Allergies  Allergen Reactions   Ace Inhibitors Swelling   Altace  [Ramipril] Anaphylaxis   Clonidine Swelling   Losartan Potassium Other (See Comments)   Levothyroxine Rash    Past Surgical History:  Procedure Laterality Date   CARDIAC CATHETERIZATION  2005   CATARACT EXTRACTION Left 10/2014   CATARACT EXTRACTION W/PHACO Right 11/19/2014   Procedure: CATARACT EXTRACTION PHACO AND INTRAOCULAR LENS PLACEMENT ([place]);  Surgeon:  Ronnell Freshwater, MD;  Location: [place];  Service: Ophthalmology;  Laterality: Right;  DIABETIC - oral meds   COLONOSCOPY WITH PROPOFOL N/A 06/30/2016   Procedure: COLONOSCOPY WITH PROPOFOL;  Surgeon: Lollie Sails, MD;  Location: [facility] ENDOSCOPY;  Service: Endoscopy;  Laterality: N/A;   EYE SURGERY     VAGINAL HYSTERECTOMY  02/10/1980    Social History   Tobacco Use   Smoking status: Former Smoker    Packs/day: 0.25    Years: 30.00    Pack years: 7.50    Types: Cigarettes    Quit date: 2003    Years since quitting: 17.6   Smokeless tobacco: Never Used   Tobacco comment: quit 2003  Substance Use Topics   Alcohol use: No    Alcohol/week: 0.0 standard drinks   Drug use: No     Medication list has been reviewed and updated.  Current Meds  Medication Sig   aspirin 81 MG tablet Take 1 tablet (81 mg total) by mouth daily.   Calcium Carbonate-Vit D-Min (CALTRATE 600+D PLUS MINERALS) 600-800 MG-UNIT TABS Take 1 tablet by mouth daily.   carvedilol (COREG) 6.25 MG tablet Take 1 tablet (6.25 mg total) by mouth 2 (two) times daily.   ferrous sulfate 325 (65 FE) MG tablet Take 1 tablet (325 mg total) by mouth every other day. (Patient taking differently: Take 325 mg by mouth every other day. No otc iron- needs prescription dosing)   hydrochlorothiazide (HYDRODIURIL) 25 MG tablet TAKE 1 TABLET BY MOUTH DAILY   MEGARED OMEGA-3 KRILL OIL 500 MG CAPS Take 1 capsule by mouth 2 (two) times daily.   metFORMIN (GLUCOPHAGE) 500 MG tablet TAKE 1 TABLET BY MOUTH TWICE DAILY   Multiple Vitamins-Minerals (CENTRUM SILVER ULTRA WOMENS) TABS Take 1 tablet by mouth daily.   olmesartan (BENICAR) 5 MG tablet Take 1 tablet (5 mg total) by mouth daily.   ONETOUCH ULTRA test strip TEST ONCE DAILY   pravastatin (PRAVACHOL) 40 MG tablet Take 1 tablet (40 mg total) by mouth daily.   SYNTHROID 25 MCG tablet Take 1 tablet by mouth daily. Dr Truddie Coco    PHQ  2/9 Scores  05/19/2018 01/24/2018 01/26/2017 01/26/2017  PHQ - 2 Score 0 0 0 0  PHQ- 9 Score 0 - 0 -    BP Readings from Last 3 Encounters:  09/16/18 132/76  05/19/18 120/70  01/26/18 130/68    Physical Exam Vitals signs and nursing note reviewed.  Constitutional:      Appearance: She is well-developed.  HENT:     Head: Normocephalic.     Right Ear: Tympanic membrane, ear canal and external ear normal.     Left Ear: Tympanic membrane, ear canal and external ear normal.  Eyes:     General: Lids are everted, no foreign bodies appreciated. No scleral icterus.       Left eye: No foreign body or hordeolum.     Conjunctiva/sclera: Conjunctivae normal.     Right eye: Right conjunctiva is not injected.     Left eye: Left conjunctiva is not injected.     Pupils: Pupils are equal, round, and reactive to light.  Neck:     Musculoskeletal: Normal range of motion and neck supple.     Thyroid: No thyromegaly.     Vascular: No JVD.     Trachea: No tracheal deviation.  Cardiovascular:     Rate and Rhythm: Normal rate and regular rhythm.     Heart sounds: Normal heart sounds. No murmur. No friction rub. No gallop.   Pulmonary:     Effort: Pulmonary effort is normal. No respiratory distress.     Breath sounds: Normal breath sounds. No wheezing, rhonchi or rales.  Abdominal:     General: Bowel sounds are normal.     Palpations: Abdomen is soft. There is no hepatomegaly, splenomegaly or mass.     Tenderness: There is no abdominal tenderness. There is no right CVA tenderness, left CVA tenderness, guarding or rebound.     Hernia: There is no hernia in the left inguinal area or right inguinal area.  Musculoskeletal: Normal range of motion.        General: No tenderness.  Lymphadenopathy:     Cervical: No cervical adenopathy.     Lower Body: No right inguinal adenopathy. No left inguinal adenopathy.  Skin:    General: Skin is warm.     Findings: No rash.  Neurological:     Mental Status: She is alert  and oriented to person, place, and time.     Cranial Nerves: No cranial nerve deficit.     Deep Tendon Reflexes: Reflexes normal.  Psychiatric:        Mood and Affect: Mood is not anxious or depressed.     Wt Readings from Last 3 Encounters:  09/16/18 132 lb (59.9 kg)  05/19/18 132 lb (59.9 kg)  01/26/18 128 lb (58.1 kg)    BP 132/76    Pulse 60    Ht 5\' 5"  (1.651 m)    Wt 132 lb (59.9 kg)    BMI 21.97 kg/m   Assessment and Plan: 1. Dyslipidemia Chronic.  Controlled.  Continue pravastatin 40 mg once a day - pravastatin (PRAVACHOL) 40 MG tablet; Take 1 tablet (40 mg total) by mouth daily.  Dispense: 90 tablet; Refill: 1  2. Hypercholesteremia As noted above - pravastatin (PRAVACHOL) 40 MG tablet; Take 1 tablet (40 mg total) by mouth daily.  Dispense: 90 tablet; Refill: 1  3. Essential (primary) hypertension Chronic.  Controlled.  Continue hydrochlorothiazide 25 mg once a day carvedilol 6.25 mg 1 tablet twice a day - hydrochlorothiazide (HYDRODIURIL) 25 MG  tablet; continue olmesartan 5 mg once a day take 1 tablet (25 mg total) by mouth daily.  Dispense: 90 tablet; Refill: 1 - carvedilol (COREG) 6.25 MG tablet; Take 1 tablet (6.25 mg total) by mouth 2 (two) times daily.  Dispense: 180 tablet; Refill: 1  4. Type 2 diabetes mellitus without complication, without long-term current use of insulin (HCC) Chronic.  Controlled.  Continue metformin 500 mg twice a day. - olmesartan (BENICAR) 5 MG tablet; Take 1 tablet (5 mg total) by mouth daily.  Dispense: 90 tablet; Refill: 1 - metFORMIN (GLUCOPHAGE) 500 MG tablet; TAKE 1 TABLET BY MOUTH TWICE DAILY  Dispense: 180 tablet; Refill: 1

## 2018-09-21 DIAGNOSIS — I10 Essential (primary) hypertension: Secondary | ICD-10-CM | POA: Diagnosis not present

## 2018-09-21 DIAGNOSIS — R011 Cardiac murmur, unspecified: Secondary | ICD-10-CM | POA: Diagnosis not present

## 2018-09-21 DIAGNOSIS — E119 Type 2 diabetes mellitus without complications: Secondary | ICD-10-CM | POA: Diagnosis not present

## 2018-09-21 DIAGNOSIS — M1990 Unspecified osteoarthritis, unspecified site: Secondary | ICD-10-CM | POA: Diagnosis not present

## 2018-09-21 DIAGNOSIS — I208 Other forms of angina pectoris: Secondary | ICD-10-CM | POA: Diagnosis not present

## 2018-09-21 DIAGNOSIS — I2510 Atherosclerotic heart disease of native coronary artery without angina pectoris: Secondary | ICD-10-CM | POA: Diagnosis not present

## 2018-09-21 DIAGNOSIS — E7849 Other hyperlipidemia: Secondary | ICD-10-CM | POA: Diagnosis not present

## 2018-09-21 DIAGNOSIS — E079 Disorder of thyroid, unspecified: Secondary | ICD-10-CM | POA: Diagnosis not present

## 2018-09-21 DIAGNOSIS — I429 Cardiomyopathy, unspecified: Secondary | ICD-10-CM | POA: Diagnosis not present

## 2018-09-26 DIAGNOSIS — B351 Tinea unguium: Secondary | ICD-10-CM | POA: Diagnosis not present

## 2018-09-26 DIAGNOSIS — E119 Type 2 diabetes mellitus without complications: Secondary | ICD-10-CM | POA: Diagnosis not present

## 2018-09-26 DIAGNOSIS — L851 Acquired keratosis [keratoderma] palmaris et plantaris: Secondary | ICD-10-CM | POA: Diagnosis not present

## 2018-10-24 DIAGNOSIS — E039 Hypothyroidism, unspecified: Secondary | ICD-10-CM | POA: Diagnosis not present

## 2018-10-24 DIAGNOSIS — E0590 Thyrotoxicosis, unspecified without thyrotoxic crisis or storm: Secondary | ICD-10-CM | POA: Diagnosis not present

## 2018-10-24 DIAGNOSIS — E049 Nontoxic goiter, unspecified: Secondary | ICD-10-CM | POA: Diagnosis not present

## 2018-10-24 DIAGNOSIS — E118 Type 2 diabetes mellitus with unspecified complications: Secondary | ICD-10-CM | POA: Diagnosis not present

## 2018-10-24 DIAGNOSIS — D509 Iron deficiency anemia, unspecified: Secondary | ICD-10-CM | POA: Diagnosis not present

## 2018-10-24 DIAGNOSIS — E785 Hyperlipidemia, unspecified: Secondary | ICD-10-CM | POA: Diagnosis not present

## 2018-10-24 DIAGNOSIS — I10 Essential (primary) hypertension: Secondary | ICD-10-CM | POA: Diagnosis not present

## 2018-10-31 DIAGNOSIS — L42 Pityriasis rosea: Secondary | ICD-10-CM | POA: Diagnosis not present

## 2018-10-31 DIAGNOSIS — E039 Hypothyroidism, unspecified: Secondary | ICD-10-CM | POA: Diagnosis not present

## 2018-10-31 DIAGNOSIS — E049 Nontoxic goiter, unspecified: Secondary | ICD-10-CM | POA: Diagnosis not present

## 2018-10-31 DIAGNOSIS — E0590 Thyrotoxicosis, unspecified without thyrotoxic crisis or storm: Secondary | ICD-10-CM | POA: Diagnosis not present

## 2018-10-31 DIAGNOSIS — I10 Essential (primary) hypertension: Secondary | ICD-10-CM | POA: Diagnosis not present

## 2018-10-31 DIAGNOSIS — E785 Hyperlipidemia, unspecified: Secondary | ICD-10-CM | POA: Diagnosis not present

## 2018-10-31 DIAGNOSIS — D509 Iron deficiency anemia, unspecified: Secondary | ICD-10-CM | POA: Diagnosis not present

## 2018-10-31 DIAGNOSIS — E118 Type 2 diabetes mellitus with unspecified complications: Secondary | ICD-10-CM | POA: Diagnosis not present

## 2018-11-09 ENCOUNTER — Ambulatory Visit (INDEPENDENT_AMBULATORY_CARE_PROVIDER_SITE_OTHER): Payer: Medicare HMO

## 2018-11-09 ENCOUNTER — Other Ambulatory Visit: Payer: Self-pay

## 2018-11-09 DIAGNOSIS — Z23 Encounter for immunization: Secondary | ICD-10-CM | POA: Diagnosis not present

## 2018-11-15 DIAGNOSIS — H43823 Vitreomacular adhesion, bilateral: Secondary | ICD-10-CM | POA: Diagnosis not present

## 2018-11-25 ENCOUNTER — Other Ambulatory Visit: Payer: Self-pay

## 2018-11-28 ENCOUNTER — Other Ambulatory Visit: Payer: Self-pay

## 2018-12-06 ENCOUNTER — Other Ambulatory Visit: Payer: Self-pay

## 2018-12-06 DIAGNOSIS — E119 Type 2 diabetes mellitus without complications: Secondary | ICD-10-CM

## 2018-12-06 DIAGNOSIS — R69 Illness, unspecified: Secondary | ICD-10-CM | POA: Diagnosis not present

## 2018-12-06 MED ORDER — ONETOUCH ULTRA VI STRP: ORAL_STRIP | 1 refills | Status: DC

## 2019-01-09 ENCOUNTER — Other Ambulatory Visit: Payer: Self-pay | Admitting: Family Medicine

## 2019-01-09 DIAGNOSIS — Z1231 Encounter for screening mammogram for malignant neoplasm of breast: Secondary | ICD-10-CM

## 2019-01-10 ENCOUNTER — Ambulatory Visit
Admission: RE | Admit: 2019-01-10 | Discharge: 2019-01-10 | Disposition: A | Discharge status: Home / Self Care | Payer: Medicare HMO | Attending: Family Medicine | Admitting: Family Medicine

## 2019-01-10 ENCOUNTER — Encounter: Payer: Self-pay | Admitting: Family Medicine

## 2019-01-10 ENCOUNTER — Ambulatory Visit (INDEPENDENT_AMBULATORY_CARE_PROVIDER_SITE_OTHER): Payer: Medicare HMO | Admitting: Family Medicine

## 2019-01-10 ENCOUNTER — Ambulatory Visit
Admission: RE | Admit: 2019-01-10 | Discharge: 2019-01-10 | Disposition: A | Discharge status: Home / Self Care | Payer: Medicare HMO | Source: Ambulatory Visit | Attending: Family Medicine | Admitting: Family Medicine

## 2019-01-10 ENCOUNTER — Other Ambulatory Visit: Payer: Self-pay

## 2019-01-10 VITALS — BP 130/78 | HR 80 | Ht 65.0 in | Wt 127.0 lb

## 2019-01-10 DIAGNOSIS — R0789 Other chest pain: Secondary | ICD-10-CM | POA: Insufficient documentation

## 2019-01-10 DIAGNOSIS — E119 Type 2 diabetes mellitus without complications: Secondary | ICD-10-CM | POA: Diagnosis not present

## 2019-01-10 DIAGNOSIS — Z0000 Encounter for general adult medical examination without abnormal findings: Secondary | ICD-10-CM | POA: Diagnosis not present

## 2019-01-10 DIAGNOSIS — I10 Essential (primary) hypertension: Secondary | ICD-10-CM

## 2019-01-10 DIAGNOSIS — G8929 Other chronic pain: Secondary | ICD-10-CM | POA: Diagnosis not present

## 2019-01-10 DIAGNOSIS — R079 Chest pain, unspecified: Secondary | ICD-10-CM | POA: Diagnosis not present

## 2019-01-10 NOTE — Progress Notes (Signed)
Date:  01/10/2019   Name:  Vanessa Zamora   DOB:  [birth year]   MRN:  [id number]   Chief Complaint: Annual Exam (mammo sched for Jan 4) and Diabetes (6.3 at last visit- wants renal drawn)  Patient is a 78 year old female who presents for a comprehensive physical exam. The patient reports the following problems: chest wall pain. Health maintenance has been reviewed up to date.  Diabetes She presents for her follow-up diabetic visit. She has type 2 diabetes mellitus. There are no hypoglycemic associated symptoms. Pertinent negatives for hypoglycemia include no dizziness, headaches or nervousness/anxiousness. Pertinent negatives for diabetes include no blurred vision, no chest pain, no fatigue, no foot paresthesias, no foot ulcerations, no polydipsia, no polyphagia, no polyuria, no visual change, no weakness and no weight loss. There are no hypoglycemic complications. Symptoms are stable. There are no diabetic complications. There are no known risk factors for coronary artery disease. Her weight is stable. She is following a generally healthy diet. She participates in exercise daily. Her breakfast blood glucose is taken between 8-9 am. Her breakfast blood glucose range is generally 110-130 mg/dl.  Chest Pain  This is a chronic problem. The current episode started more than 1 year ago (less than a year). The problem occurs daily. The problem has been waxing and waning. The pain is present in the lateral region (right lateral chest). The pain is at a severity of 2/10. The pain is mild. The quality of the pain is described as sharp (" zip sharp"). The pain does not radiate. Pertinent negatives include no abdominal pain, back pain, claudication, cough, diaphoresis, dizziness, exertional chest pressure, fever, headaches, hemoptysis, irregular heartbeat, leg pain, lower extremity edema, malaise/fatigue, nausea, near-syncope, numbness, orthopnea, palpitations, PND, shortness of breath or weakness. Associated  symptoms comments: "knots".    Lab Results  Component Value Date   CREATININE 0.84 05/19/2018   BUN 18 05/19/2018   NA 143 05/19/2018   K 4.1 05/19/2018   CL 103 05/19/2018   CO2 26 05/19/2018   Lab Results  Component Value Date   CHOL 196 01/26/2018   HDL 91 01/26/2018   LDLCALC 94 01/26/2018   TRIG 57 01/26/2018   CHOLHDL 2.2 01/26/2018   Lab Results  Component Value Date   TSH 1.71 06/02/2016   Lab Results  Component Value Date   HGBA1C 6.3 (H) 05/19/2018     Review of Systems  Constitutional: Negative for chills, diaphoresis, fatigue, fever, malaise/fatigue and weight loss.  HENT: Negative for drooling, ear discharge, ear pain and sore throat.   Eyes: Negative for blurred vision.  Respiratory: Negative for cough, hemoptysis, shortness of breath and wheezing.   Cardiovascular: Negative for chest pain, palpitations, orthopnea, claudication, leg swelling, PND and near-syncope.  Gastrointestinal: Negative for abdominal pain, blood in stool, constipation, diarrhea and nausea.  Endocrine: Negative for polydipsia, polyphagia and polyuria.  Genitourinary: Negative for dysuria, frequency, hematuria and urgency.  Musculoskeletal: Negative for back pain, myalgias and neck pain.  Skin: Negative for rash.  Allergic/Immunologic: Negative for environmental allergies.  Neurological: Negative for dizziness, weakness, numbness and headaches.  Hematological: Does not bruise/bleed easily.  Psychiatric/Behavioral: Negative for suicidal ideas. The patient is not nervous/anxious.     Patient Active Problem List   Diagnosis Date Noted   Myocardial infarction [facility]) 01/24/2018   Coronary artery disease 01/24/2018   Diabetes ([place]) 01/24/2018   Heart murmur 01/24/2018   Anemia 12/19/2015   Osteoarthritis of left glenohumeral joint 06/20/2015  Type 2 diabetes mellitus without complication, without long-term current use of insulin ([place]) 06/07/2014   Hypercholesterolemia  06/07/2014   Dyslipidemia 06/07/2014   Hypertension 06/07/2014   Thyrotoxicosis 06/07/2014   Routine general medical examination at a health care facility 06/07/2014   Screening for depression 06/07/2014    Allergies  Allergen Reactions   Ace Inhibitors Swelling   Altace  [Ramipril] Anaphylaxis   Clonidine Swelling   Losartan Potassium Other (See Comments)   Levothyroxine Rash    Past Surgical History:  Procedure Laterality Date   CARDIAC CATHETERIZATION  2005   CATARACT EXTRACTION Left 10/2014   CATARACT EXTRACTION W/PHACO Right 11/19/2014   Procedure: CATARACT EXTRACTION PHACO AND INTRAOCULAR LENS PLACEMENT ([place]);  Surgeon: Ronnell Freshwater, MD;  Location: [place];  Service: Ophthalmology;  Laterality: Right;  DIABETIC - oral meds   COLONOSCOPY WITH PROPOFOL N/A 06/30/2016   Procedure: COLONOSCOPY WITH PROPOFOL;  Surgeon: Lollie Sails, MD;  Location[facility] ENDOSCOPY;  Service: Endoscopy;  Laterality: N/A;   EYE SURGERY     VAGINAL HYSTERECTOMY  02/10/1980    Social History   Tobacco Use   Smoking status: Former Smoker    Packs/day: 0.25    Years: 30.00    Pack years: 7.50    Types: Cigarettes    Quit date: 2003    Years since quitting: 17.9   Smokeless tobacco: Never Used   Tobacco comment: quit 2003  Substance Use Topics   Alcohol use: No    Alcohol/week: 0.0 standard drinks   Drug use: No     Medication list has been reviewed and updated.  Current Meds  Medication Sig   aspirin 81 MG tablet Take 1 tablet (81 mg total) by mouth daily.   Calcium Carbonate-Vit D-Min (CALTRATE 600+D PLUS MINERALS) 600-800 MG-UNIT TABS Take 1 tablet by mouth daily.   carvedilol (COREG) 6.25 MG tablet Take 1 tablet (6.25 mg total) by mouth 2 (two) times daily.   ferrous sulfate 325 (65 FE) MG tablet Take 1 tablet (325 mg total) by mouth every other day.   glucose blood (ONETOUCH ULTRA) test strip TEST ONCE DAILY    hydrochlorothiazide (HYDRODIURIL) 25 MG tablet Take 1 tablet (25 mg total) by mouth daily.   MEGARED OMEGA-3 KRILL OIL 500 MG CAPS Take 1 capsule by mouth 2 (two) times daily.   metFORMIN (GLUCOPHAGE) 500 MG tablet TAKE 1 TABLET BY MOUTH TWICE DAILY   Multiple Vitamins-Minerals (CENTRUM SILVER ULTRA WOMENS) TABS Take 1 tablet by mouth daily.   olmesartan (BENICAR) 5 MG tablet Take 1 tablet (5 mg total) by mouth daily.   pravastatin (PRAVACHOL) 40 MG tablet Take 1 tablet (40 mg total) by mouth daily.   SYNTHROID 25 MCG tablet Take 1 tablet by mouth daily. Dr Truddie Coco    [facility] 2/9 Scores 01/10/2019 05/19/2018 01/24/2018 01/26/2017  PHQ - 2 Score 0 0 0 0  PHQ- 9 Score 0 0 - 0    BP Readings from Last 3 Encounters:  01/10/19 130/78  09/16/18 132/76  05/19/18 120/70    Physical Exam Vitals signs and nursing note reviewed.  Constitutional:      Appearance: She is well-developed and normal weight.  HENT:     Head: Normocephalic.     Right Ear: Tympanic membrane, ear canal and external ear normal.     Left Ear: Tympanic membrane, ear canal and external ear normal.     Nose: Nose normal. No congestion or rhinorrhea.     Mouth/Throat:  Mouth: Mucous membranes are moist.     Pharynx: Oropharynx is clear. No oropharyngeal exudate or posterior oropharyngeal erythema.  Eyes:     General: No scleral icterus.    Extraocular Movements: Extraocular movements intact.     Conjunctiva/sclera: Conjunctivae normal.     Pupils: Pupils are equal, round, and reactive to light.  Neck:     Musculoskeletal: Full passive range of motion without pain, normal range of motion and neck supple.     Thyroid: No thyroid mass, thyromegaly or thyroid tenderness.  Cardiovascular:     Rate and Rhythm: Normal rate and regular rhythm.     Chest Wall: PMI is not displaced.     Pulses: Normal pulses.     Heart sounds: Normal heart sounds, S1 normal and S2 normal. No murmur. No systolic murmur. No diastolic  murmur. No friction rub. No gallop. No S3 or S4 sounds.   Pulmonary:     Effort: Pulmonary effort is normal.     Breath sounds: Normal breath sounds. No wheezing, rhonchi or rales.  Chest:     Chest wall: Tenderness present. No mass.     Breasts: Breasts are symmetrical.        Right: Normal. No swelling, bleeding, inverted nipple, mass, nipple discharge, skin change or tenderness.        Left: Normal. No swelling, bleeding, inverted nipple, mass, nipple discharge, skin change or tenderness.  Abdominal:     General: Bowel sounds are normal. There is no distension or abdominal bruit.     Palpations: Abdomen is soft. There is no hepatomegaly, splenomegaly or mass.     Tenderness: There is no abdominal tenderness. There is no right CVA tenderness, left CVA tenderness, guarding or rebound.     Hernia: No hernia is present.  Genitourinary:    Vagina: Normal.  Musculoskeletal: Normal range of motion.     Right lower leg: No edema.     Left lower leg: No edema.  Lymphadenopathy:     Cervical: No cervical adenopathy.     Upper Body:     Right upper body: No supraclavicular or axillary adenopathy.     Left upper body: No supraclavicular or axillary adenopathy.  Skin:    General: Skin is warm and dry.     Capillary Refill: Capillary refill takes less than 2 seconds.  Neurological:     General: No focal deficit present.     Mental Status: She is alert and oriented to person, place, and time.     Motor: No weakness.     Deep Tendon Reflexes: Reflexes are normal and symmetric.  Psychiatric:        Mood and Affect: Mood normal.        Behavior: Behavior normal.        Thought Content: Thought content normal.        Judgment: Judgment normal.     Wt Readings from Last 3 Encounters:  01/10/19 127 lb (57.6 kg)  09/16/18 132 lb (59.9 kg)  05/19/18 132 lb (59.9 kg)    BP 130/78    Pulse 80    Ht 5\' 5"  (1.651 m)    Wt 127 lb (57.6 kg)    BMI 21.13 kg/m    Assessment and Plan: 1. Annual  physical exam No subjective objective concerns noted on history and physical.  Previous encounters and labs were reviewed.Vanessa Zamora is a 78 y.o. female who presents today for her Complete Annual Exam. She feels well. She  reports exercising . She reports she is sleeping well. Immunizations are reviewed and recommendations provided.   Age appropriate screening tests are discussed. Counseling given for risk factor reduction interventions.  2. Type 2 diabetes mellitus without complication, without long-term current use of insulin (HCC) Chronic.  Controlled.  Will continue Metformin 500 mg twice a day.  Will check A1c and renal function panel - Hemoglobin A1c - Renal Function Panel  3. Essential hypertension Chronic.  Controlled.  Will continue carvedilol 6.25 twice a day hydrochlorothiazide 25 mg once a day and olmesartan 5 mg once a day.  Will check renal function panel - Hemoglobin A1c - Renal Function Panel  4. Chest wall pain, chronic Patient has had ongoing chest wall pain for almost a year it is beneath her right breast it is tender along the area patient has not had any recent falls or any trauma.  Patient is tender along the ninth rib will obtain a chest x-ray to see if there is any old fracture possibility or any pulmonary concern. - DG Chest 2 View; Future

## 2019-01-11 ENCOUNTER — Other Ambulatory Visit: Payer: Self-pay

## 2019-01-11 DIAGNOSIS — R9389 Abnormal findings on diagnostic imaging of other specified body structures: Secondary | ICD-10-CM

## 2019-01-11 LAB — RENAL FUNCTION PANEL
Albumin: 4.8 g/dL — ABNORMAL HIGH (ref 3.7–4.7)
BUN/Creatinine Ratio: 21 (ref 12–28)
BUN: 17 mg/dL (ref 8–27)
CO2: 24 mmol/L (ref 20–29)
Calcium: 9.5 mg/dL (ref 8.7–10.3)
Chloride: 104 mmol/L (ref 96–106)
Creatinine, Ser: 0.81 mg/dL (ref 0.57–1.00)
GFR calc Af Amer: 79 mL/min/{1.73_m2} (ref >59)
GFR calc non Af Amer: 69 mL/min/{1.73_m2} (ref >59)
Glucose: 107 mg/dL — ABNORMAL HIGH (ref 65–99)
Phosphorus: 3.2 mg/dL (ref 3.0–4.3)
Potassium: 4.1 mmol/L (ref 3.5–5.2)
Sodium: 142 mmol/L (ref 134–144)

## 2019-01-11 LAB — HEMOGLOBIN A1C
Est. average glucose Bld gHb Est-mCnc: 137 mg/dL
Hgb A1c MFr Bld: 6.4 % — ABNORMAL HIGH (ref 4.8–5.6)

## 2019-01-11 NOTE — Progress Notes (Unsigned)
Put in CT

## 2019-01-17 ENCOUNTER — Ambulatory Visit
Admission: RE | Admit: 2019-01-17 | Discharge: 2019-01-17 | Disposition: A | Discharge status: Home / Self Care | Payer: Medicare HMO | Source: Ambulatory Visit | Attending: Family Medicine | Admitting: Family Medicine

## 2019-01-17 ENCOUNTER — Other Ambulatory Visit: Payer: Self-pay

## 2019-01-17 DIAGNOSIS — R9389 Abnormal findings on diagnostic imaging of other specified body structures: Secondary | ICD-10-CM | POA: Diagnosis present

## 2019-01-17 DIAGNOSIS — R918 Other nonspecific abnormal finding of lung field: Secondary | ICD-10-CM | POA: Diagnosis not present

## 2019-01-17 MED ORDER — IOHEXOL 300 MG/ML  SOLN
75.0000 mL | Freq: Once | INTRAMUSCULAR | Status: AC | PRN
  Administered 2019-01-17: 75 mL via INTRAVENOUS

## 2019-01-26 ENCOUNTER — Telehealth: Payer: Self-pay | Admitting: Family Medicine

## 2019-01-26 NOTE — Telephone Encounter (Signed)
Called to REschedule Medicare Annual Wellness Visit with Nurse Health Advisor, Kasey Uthus at [facility]. If patient returns call, please schedule AWV with [facility] ~ Any date on [facility] schedule (Mon or Wed)  Questions regarding scheduling, please call  [phone] or MS Teams > [email or web address]   Kathryn Brown  Care Guide . Embedded Care Coordination [place]  Care Management [email or web address]  [phone]     

## 2019-01-30 ENCOUNTER — Ambulatory Visit: Payer: Medicare HMO

## 2019-01-30 DIAGNOSIS — M79675 Pain in left toe(s): Secondary | ICD-10-CM | POA: Diagnosis not present

## 2019-01-30 DIAGNOSIS — D2370 Other benign neoplasm of skin of unspecified lower limb, including hip: Secondary | ICD-10-CM | POA: Diagnosis not present

## 2019-01-30 DIAGNOSIS — E119 Type 2 diabetes mellitus without complications: Secondary | ICD-10-CM | POA: Diagnosis not present

## 2019-01-30 DIAGNOSIS — M79674 Pain in right toe(s): Secondary | ICD-10-CM | POA: Diagnosis not present

## 2019-01-30 DIAGNOSIS — B351 Tinea unguium: Secondary | ICD-10-CM | POA: Diagnosis not present

## 2019-02-02 LAB — HM DIABETES FOOT EXAM: HM Diabetic Foot Exam: NORMAL

## 2019-02-04 DIAGNOSIS — I252 Old myocardial infarction: Secondary | ICD-10-CM | POA: Diagnosis not present

## 2019-02-04 DIAGNOSIS — M1990 Unspecified osteoarthritis, unspecified site: Secondary | ICD-10-CM | POA: Diagnosis not present

## 2019-02-04 DIAGNOSIS — I739 Peripheral vascular disease, unspecified: Secondary | ICD-10-CM | POA: Diagnosis not present

## 2019-02-04 DIAGNOSIS — I10 Essential (primary) hypertension: Secondary | ICD-10-CM | POA: Diagnosis not present

## 2019-02-04 DIAGNOSIS — G8929 Other chronic pain: Secondary | ICD-10-CM | POA: Diagnosis not present

## 2019-02-04 DIAGNOSIS — E119 Type 2 diabetes mellitus without complications: Secondary | ICD-10-CM | POA: Diagnosis not present

## 2019-02-04 DIAGNOSIS — K5900 Constipation, unspecified: Secondary | ICD-10-CM | POA: Diagnosis not present

## 2019-02-04 DIAGNOSIS — E039 Hypothyroidism, unspecified: Secondary | ICD-10-CM | POA: Diagnosis not present

## 2019-02-04 DIAGNOSIS — I2510 Atherosclerotic heart disease of native coronary artery without angina pectoris: Secondary | ICD-10-CM | POA: Diagnosis not present

## 2019-02-04 DIAGNOSIS — E785 Hyperlipidemia, unspecified: Secondary | ICD-10-CM | POA: Diagnosis not present

## 2019-02-08 ENCOUNTER — Ambulatory Visit: Payer: Medicare HMO

## 2019-02-13 ENCOUNTER — Ambulatory Visit
Admission: RE | Admit: 2019-02-13 | Discharge: 2019-02-13 | Disposition: A | Discharge status: Home / Self Care | Payer: Medicare HMO | Source: Ambulatory Visit | Attending: Family Medicine | Admitting: Family Medicine

## 2019-02-13 DIAGNOSIS — Z1231 Encounter for screening mammogram for malignant neoplasm of breast: Secondary | ICD-10-CM | POA: Diagnosis not present

## 2019-02-15 ENCOUNTER — Ambulatory Visit (INDEPENDENT_AMBULATORY_CARE_PROVIDER_SITE_OTHER): Payer: Medicare HMO

## 2019-02-15 VITALS — Ht 65.0 in | Wt 127.0 lb

## 2019-02-15 DIAGNOSIS — Z0000 Encounter for general adult medical examination without abnormal findings: Secondary | ICD-10-CM

## 2019-02-15 NOTE — Progress Notes (Signed)
Subjective:   Vanessa Zamora is a 79 y.o. female who presents for Medicare Annual (Subsequent) preventive examination.  Virtual Visit via Telephone Note  I connected with Grant Fontana on 02/15/19 at  8:40 AM EST by telephone and verified that I am speaking with the correct person using two identifiers.  Medicare Annual Wellness visit completed telephonically due to Covid-19 pandemic.   Location: Patient: home Provider: office   I discussed the limitations, risks, security and privacy concerns of performing an evaluation and management service by telephone and the availability of in person appointments. The patient expressed understanding and agreed to proceed.  Some vital signs may be absent or patient reported.   Clemetine Marker, LPN     Review of Systems:   Cardiac Risk Factors include: advanced age (>48men, >59 women);diabetes mellitus;dyslipidemia;hypertension     Objective:     Vitals: Ht 5\' 5"  (1.651 m)   Wt 127 lb (57.6 kg)   BMI 21.13 kg/m   Body mass index is 21.13 kg/m.  Advanced Directives 02/15/2019 01/24/2018 01/21/2017 06/30/2016 12/06/2014 11/19/2014  Does Patient Have a Medical Advance Directive? Yes No Yes Yes Yes No  Type of Paramedic of [place];Living will - [place];Living will Living will [place] -  Does patient want to make changes to medical advance directive? - Yes (MAU/Ambulatory/Procedural Areas - Information given) - - - -  Copy of [place] in Chart? No - copy requested - No - copy requested - No - copy requested -  Would patient like information on creating a medical advance directive? - - - - - Yes - Educational materials given    Tobacco Social History   Tobacco Use  Smoking Status Former Smoker  . Packs/day: 0.25  . Years: 30.00  . Pack years: 7.50  . Types: Cigarettes  . Quit date: 2003  . Years since quitting: 18.0  Smokeless Tobacco Never Used    Tobacco Comment   quit 2003     Counseling given: Not Answered Comment: quit 2003   Clinical Intake:  Pre-visit preparation completed: Yes  Pain : 0-10 Pain Score: 5  Pain Type: Chronic pain(arthritis) Pain Location: Knee(shoulder) Pain Orientation: Right Pain Descriptors / Indicators: Aching, Discomfort, Sore Pain Onset: More than a month ago Pain Frequency: Several days a week     BMI - recorded: 21.13 Nutritional Status: BMI of 19-24  Normal Nutritional Risks: None Diabetes: Yes CBG done?: No Did pt. bring in CBG monitor from home?: No   Nutrition Risk Assessment:  Has the patient had any N/V/D within the last 2 months?  No  Does the patient have any non-healing wounds?  No  Has the patient had any unintentional weight loss or weight gain?  No   Diabetes:  Is the patient diabetic?  Yes  If diabetic, was a CBG obtained today?  No  Did the patient bring in their glucometer from home?  No  How often do you monitor your CBG's? daily.   Financial Strains and Diabetes Management:  Are you having any financial strains with the device, your supplies or your medication? No .  Does the patient want to be seen by Chronic Care Management for management of their diabetes?  No  Would the patient like to be referred to a Nutritionist or for Diabetic Management?  No   Diabetic Exams:  Diabetic Eye Exam: Completed 11/15/18 negative retinopathy.  Diabetic Foot Exam: Completed 07/28/17. Pt  has been advised about the importance in completing this exam. Pt is scheduled for diabetic foot exam on 05/11/19.Marland Kitchen   How often do you need to have someone help you when you read instructions, pamphlets, or other written materials from your doctor or pharmacy?: 1 - Never  Interpreter Needed?: No  Information entered by :: Clemetine Marker LPN  Past Medical History:  Diagnosis Date  . Arthritis    shoulders, knees right side  . Coronary artery disease   . Diabetes mellitus without  complication ([place])   . Hypercholesteremia   . Hypertension   . Myocardial infarction ([place]) 2005  . Seasonal allergies   . Thyroid goiter   . Wears dentures    partial lower   Past Surgical History:  Procedure Laterality Date  . CARDIAC CATHETERIZATION  2005  . CATARACT EXTRACTION Left 10/2014  . CATARACT EXTRACTION W/PHACO Right 11/19/2014   Procedure: CATARACT EXTRACTION PHACO AND INTRAOCULAR LENS PLACEMENT ([place]);  Surgeon: Ronnell Freshwater, MD;  Location: [facility];  Service: Ophthalmology;  Laterality: Right;  DIABETIC - oral meds  . COLONOSCOPY WITH PROPOFOL N/A 06/30/2016   Procedure: COLONOSCOPY WITH PROPOFOL;  Surgeon: Lollie Sails, MD;  Location[facility] ENDOSCOPY;  Service: Endoscopy;  Laterality: N/A;  . EYE SURGERY    . VAGINAL HYSTERECTOMY  02/10/1980   Family History  Problem Relation Age of Onset  . Stroke Mother   . Aneurysm Sister   . Heart disease Brother   . Hypertension Brother   . Goiter Maternal Grandmother   . Diabetes Maternal Grandmother   . COPD Brother   . Hypertension Brother   . Breast cancer Neg Hx    Social History   Socioeconomic History  . Marital status: Single    Spouse name: Not on file  . Number of children: 1  . Years of education: Not on file  . Highest education level: Not on file  Occupational History  . Occupation: Retired  Tobacco Use  . Smoking status: Former Smoker    Packs/day: 0.25    Years: 30.00    Pack years: 7.50    Types: Cigarettes    Quit date: 2003    Years since quitting: 18.0  . Smokeless tobacco: Never Used  . Tobacco comment: quit 2003  Substance and Sexual Activity  . Alcohol use: No    Alcohol/week: 0.0 standard drinks  . Drug use: No  . Sexual activity: Not Currently  Other Topics Concern  . Not on file  Social History Narrative  . Not on file   Social Determinants of Health   Financial Resource Strain: Low Risk   . Difficulty of Paying Living Expenses: Not hard at all    Food Insecurity: No Food Insecurity  . Worried About Charity fundraiser in the Last Year: Never true  . Ran Out of Food in the Last Year: Never true  Transportation Needs: No Transportation Needs  . Lack of Transportation (Medical): No  . Lack of Transportation (Non-Medical): No  Physical Activity: Sufficiently Active  . Days of Exercise per Week: 5 days  . Minutes of Exercise per Session: 60 min  Stress: No Stress Concern Present  . Feeling of Stress : Not at all  Social Connections: Unknown  . Frequency of Communication with Friends and Family: Twice a week  . Frequency of Social Gatherings with Friends and Family: Once a week  . Attends Religious Services: More than 4 times per year  . Active Member of  Clubs or Organizations: Yes  . Attends Archivist Meetings: More than 4 times per year  . Marital Status: Patient refused    Outpatient Encounter Medications as of 02/15/2019  Medication Sig  . aspirin 81 MG tablet Take 1 tablet (81 mg total) by mouth daily.  . Calcium Carbonate-Vit D-Min (CALTRATE 600+D PLUS MINERALS) 600-800 MG-UNIT TABS Take 1 tablet by mouth daily.  . carvedilol (COREG) 6.25 MG tablet Take 1 tablet (6.25 mg total) by mouth 2 (two) times daily.  Marland Kitchen docusate sodium (COLACE) 100 MG capsule Take 100 mg by mouth daily as needed for mild constipation.  . ferrous sulfate 325 (65 FE) MG tablet Take 1 tablet (325 mg total) by mouth every other day.  Marland Kitchen glucose blood (ONETOUCH ULTRA) test strip TEST ONCE DAILY  . hydrochlorothiazide (HYDRODIURIL) 25 MG tablet Take 1 tablet (25 mg total) by mouth daily.  Marland Kitchen MEGARED OMEGA-3 KRILL OIL 500 MG CAPS Take 1 capsule by mouth 2 (two) times daily.  . metFORMIN (GLUCOPHAGE) 500 MG tablet TAKE 1 TABLET BY MOUTH TWICE DAILY  . Multiple Vitamins-Minerals (CENTRUM SILVER ULTRA WOMENS) TABS Take 1 tablet by mouth daily.  Marland Kitchen olmesartan (BENICAR) 5 MG tablet Take 1 tablet (5 mg total) by mouth daily.  . pravastatin (PRAVACHOL) 40 MG  tablet Take 1 tablet (40 mg total) by mouth daily.  Marland Kitchen SYNTHROID 25 MCG tablet Take 1 tablet by mouth daily. Dr Truddie Coco   No facility-administered encounter medications on file as of 02/15/2019.    Activities of Daily Living In your present state of health, do you have any difficulty performing the following activities: 02/15/2019  Hearing? N  Comment declines hearing aids  Vision? N  Difficulty concentrating or making decisions? N  Walking or climbing stairs? N  Dressing or bathing? N  Doing errands, shopping? N  Preparing Food and eating ? N  Using the Toilet? N  In the past six months, have you accidently leaked urine? N  Do you have problems with loss of bowel control? N  Managing your Medications? N  Managing your Finances? N  Housekeeping or managing your Housekeeping? N  Some recent data might be hidden    Patient Care Team: Juline Patch, MD as PCP - General (Family Medicine) Morayati, Lourdes Sledge, MD as Consulting Physician (Endocrinology) Yolonda Kida, MD as Consulting Physician (Cardiology)    Assessment:   This is a routine wellness examination for [place].  Exercise Activities and Dietary recommendations Current Exercise Habits: Home exercise routine, Type of exercise: walking, Time (Minutes): 60, Frequency (Times/Week): 5, Weekly Exercise (Minutes/Week): 300, Intensity: Moderate, Exercise limited by: orthopedic condition(s)  Goals    . DIET - INCREASE WATER INTAKE     Recommend 6-8 glasses of water per day    . Prevent falls     Recommend to remove items from the home that may cause slips or trips       Fall Risk Fall Risk  02/15/2019 05/19/2018 01/26/2018 01/24/2018 01/26/2017  Falls in the past year? 1 0 0 0 No  Number falls in past yr: 1 - - 0 -  Comment mechanical fall - - - -  Injury with Fall? 0 - - - -  Risk for fall due to : History of fall(s) - - - -  Follow up Falls prevention discussed Falls evaluation completed Falls evaluation completed - -     FALL RISK PREVENTION PERTAINING TO THE HOME:  Any stairs in or around the home?  No  If so, do they handrails? No   Home free of loose throw rugs in walkways, pet beds, electrical cords, etc? Yes  Adequate lighting in your home to reduce risk of falls? Yes   ASSISTIVE DEVICES UTILIZED TO PREVENT FALLS:  Life alert? No  Use of a cane, walker or w/c? No  Grab bars in the bathroom? Yes  Shower chair or bench in shower? No  Elevated toilet seat or a handicapped toilet? No   DME ORDERS:  DME order needed?  No   TIMED UP AND GO:  Was the test performed? No . Telephonic visit.   Education: Fall risk prevention has been discussed.  Intervention(s) required? No    Depression Screen PHQ 2/9 Scores 02/15/2019 01/10/2019 05/19/2018 01/24/2018  PHQ - 2 Score 0 0 0 0  PHQ- 9 Score - 0 0 -     Cognitive Function     6CIT Screen 02/15/2019 01/24/2018 01/21/2017  What Year? 0 points 0 points 0 points  What month? 0 points 0 points 0 points  What time? 0 points 0 points 0 points  Count back from 20 0 points 0 points 0 points  Months in reverse 0 points 0 points 0 points  Repeat phrase 0 points 0 points 0 points  Total Score 0 0 0    Immunization History  Administered Date(s) Administered  . Fluad Quad(high Dose 65+) 11/09/2018  . Influenza, High Dose Seasonal PF 11/24/2016, 11/12/2017  . Influenza, Seasonal, Injecte, Preservative Fre 11/13/2010, 11/18/2011  . Influenza,inj,Quad PF,6+ Mos 11/16/2012, 11/13/2013, 12/06/2014, 11/12/2015  . Influenza-Unspecified 11/13/2013  . Pneumococcal Conjugate-13 12/18/2014  . Pneumococcal Polysaccharide-23 05/08/2010, 05/13/2012  . Td 09/26/1987  . Tdap 01/28/2016  . Zoster 01/09/2013  . Zoster Recombinat (Shingrix) 12/10/2017, 02/17/2018    Qualifies for Shingles Vaccine? Shingrix series completed.   Tdap: Up to date  Flu Vaccine: Up to date  Pneumococcal Vaccine: Up to date   Screening Tests Health Maintenance  Topic Date Due   . FOOT EXAM  07/29/2018  . HEMOGLOBIN A1C  07/11/2019  . OPHTHALMOLOGY EXAM  11/10/2019  . MAMMOGRAM  02/13/2020  . TETANUS/TDAP  01/27/2026  . INFLUENZA VACCINE  Completed  . DEXA SCAN  Completed  . PNA vac Low Risk Adult  Completed    Cancer Screenings:  Colorectal Screening: Completed 06/30/16. No longer required.   Mammogram: Completed 02/13/19. Repeat every year;   Bone Density: Completed 03/16/18. Results reflect NORMAL. Repeat every 2 years.   Lung Cancer Screening: (Low Dose CT Chest recommended if ge 55-80 years, 30 pack-year currently smoking AOR have quit w/in 15years.) does not qualify.   Additional Screening:  Hepatitis C Screening: does not qualify;   Vision Screening: Recommended annual ophthalmology exams for early detection of glaucoma and other disorders of the eye. Is the patient up to date with their annual eye exam?  Yes  Who is the provider or what is the name of the office in which the pt attends annual eye exams? [employer] Screening: Recommended annual dental exams for proper oral hygiene  Community Resource Referral:  CRR required this visit?  No       Plan:     I have personally reviewed and addressed the Medicare Annual Wellness questionnaire and have noted the following in the patient's chart:  A. Medical and social history B. Use of alcohol, tobacco or illicit drugs  C. Current medications and supplements D. Functional ability and status E.  Nutritional status  F.  Physical activity G. Advance directives H. List of other physicians I.  Hospitalizations, surgeries, and ER visits in previous 12 months J.  [place] such as hearing and vision if needed, cognitive and depression L. Referrals and appointments   In addition, I have reviewed and discussed with patient certain preventive protocols, quality metrics, and best practice recommendations. A written personalized care plan for preventive services as well as  general preventive health recommendations were provided to patient.   Signed,  Clemetine Marker, LPN Nurse Health Advisor   Nurse Notes: none

## 2019-02-15 NOTE — Patient Instructions (Signed)
Vanessa Zamora , Thank you for taking time to come for your Medicare Wellness Visit. I appreciate your ongoing commitment to your health goals. Please review the following plan we discussed and let me know if I can assist you in the future.   Screening recommendations/referrals: Colonoscopy: done 06/30/16 Mammogram: done 02/13/19 Bone Density: done 03/16/18 Recommended yearly ophthalmology/optometry visit for glaucoma screening and checkup Recommended yearly dental visit for hygiene and checkup  Vaccinations: Influenza vaccine: done 11/09/18 Pneumococcal vaccine: done 12/18/14 Tdap vaccine: done 01/28/16 Shingles vaccine: Shingrix completed 02/17/18    Advanced directives: Please bring a copy of your health care power of attorney and living will to the office at your convenience.  Conditions/risks identified: Keep up the great work!  Next appointment: Please follow up in one year for your Medicare Annual Wellness visit.     Preventive Care 48 Years and Older, Female Preventive care refers to lifestyle choices and visits with your health care provider that can promote health and wellness. What does preventive care include?  A yearly physical exam. This is also called an annual well check.  Dental exams once or twice a year.  Routine eye exams. Ask your health care provider how often you should have your eyes checked.  Personal lifestyle choices, including:  Daily care of your teeth and gums.  Regular physical activity.  Eating a healthy diet.  Avoiding tobacco and drug use.  Limiting alcohol use.  Practicing safe sex.  Taking low-dose aspirin every day.  Taking vitamin and mineral supplements as recommended by your health care provider. What happens during an annual well check? The services and screenings done by your health care provider during your annual well check will depend on your age, overall health, lifestyle risk factors, and family history of disease. Counseling    Your health care provider may ask you questions about your:  Alcohol use.  Tobacco use.  Drug use.  Emotional well-being.  Home and relationship well-being.  Sexual activity.  Eating habits.  History of falls.  Memory and ability to understand (cognition).  Work and work Statistician.  Reproductive health. Screening  You may have the following tests or measurements:  Height, weight, and BMI.  Blood pressure.  Lipid and cholesterol levels. These may be checked every 5 years, or more frequently if you are over 94 years old.  Skin check.  Lung cancer screening. You may have this screening every year starting at age 102 if you have a 30-pack-year history of smoking and currently smoke or have quit within the past 15 years.  Fecal occult blood test (FOBT) of the stool. You may have this test every year starting at age 42.  Flexible sigmoidoscopy or colonoscopy. You may have a sigmoidoscopy every 5 years or a colonoscopy every 10 years starting at age 34.  Hepatitis C blood test.  Hepatitis B blood test.  Sexually transmitted disease (STD) testing.  Diabetes screening. This is done by checking your blood sugar (glucose) after you have not eaten for a while (fasting). You may have this done every 1-3 years.  Bone density scan. This is done to screen for osteoporosis. You may have this done starting at age 85.  Mammogram. This may be done every 1-2 years. Talk to your health care provider about how often you should have regular mammograms. Talk with your health care provider about your test results, treatment options, and if necessary, the need for more tests. Vaccines  Your health care provider may recommend certain vaccines,  such as:  Influenza vaccine. This is recommended every year.  Tetanus, diphtheria, and acellular pertussis (Tdap, Td) vaccine. You may need a Td booster every 10 years.  Zoster vaccine. You may need this after age 37.  Pneumococcal 13-valent  conjugate (PCV13) vaccine. One dose is recommended after age 15.  Pneumococcal polysaccharide (PPSV23) vaccine. One dose is recommended after age 62. Talk to your health care provider about which screenings and vaccines you need and how often you need them. This information is not intended to replace advice given to you by your health care provider. Make sure you discuss any questions you have with your health care provider. Document Released: 02/22/2015 Document Revised: 10/16/2015 Document Reviewed: 11/27/2014 Elsevier Interactive Patient Education  2017 [place] Prevention in the Home Falls can cause injuries. They can happen to people of all ages. There are many things you can do to make your home safe and to help prevent falls. What can I do on the outside of my home?  Regularly fix the edges of walkways and driveways and fix any cracks.  Remove anything that might make you trip as you walk through a door, such as a raised step or threshold.  Trim any bushes or trees on the path to your home.  Use bright outdoor lighting.  Clear any walking paths of anything that might make someone trip, such as rocks or tools.  Regularly check to see if handrails are loose or broken. Make sure that both sides of any steps have handrails.  Any raised decks and porches should have guardrails on the edges.  Have any leaves, snow, or ice cleared regularly.  Use sand or salt on walking paths during winter.  Clean up any spills in your garage right away. This includes oil or grease spills. What can I do in the bathroom?  Use night lights.  Install grab bars by the toilet and in the tub and shower. Do not use towel bars as grab bars.  Use non-skid mats or decals in the tub or shower.  If you need to sit down in the shower, use a plastic, non-slip stool.  Keep the floor dry. Clean up any water that spills on the floor as soon as it happens.  Remove soap buildup in the tub or  shower regularly.  Attach bath mats securely with double-sided non-slip rug tape.  Do not have throw rugs and other things on the floor that can make you trip. What can I do in the bedroom?  Use night lights.  Make sure that you have a light by your bed that is easy to reach.  Do not use any sheets or blankets that are too big for your bed. They should not hang down onto the floor.  Have a firm chair that has side arms. You can use this for support while you get dressed.  Do not have throw rugs and other things on the floor that can make you trip. What can I do in the kitchen?  Clean up any spills right away.  Avoid walking on wet floors.  Keep items that you use a lot in easy-to-reach places.  If you need to reach something above you, use a strong step stool that has a grab bar.  Keep electrical cords out of the way.  Do not use floor polish or wax that makes floors slippery. If you must use wax, use non-skid floor wax.  Do not have throw rugs and other things on  the floor that can make you trip. What can I do with my stairs?  Do not leave any items on the stairs.  Make sure that there are handrails on both sides of the stairs and use them. Fix handrails that are broken or loose. Make sure that handrails are as long as the stairways.  Check any carpeting to make sure that it is firmly attached to the stairs. Fix any carpet that is loose or worn.  Avoid having throw rugs at the top or bottom of the stairs. If you do have throw rugs, attach them to the floor with carpet tape.  Make sure that you have a light switch at the top of the stairs and the bottom of the stairs. If you do not have them, ask someone to add them for you. What else can I do to help prevent falls?  Wear shoes that:  Do not have high heels.  Have rubber bottoms.  Are comfortable and fit you well.  Are closed at the toe. Do not wear sandals.  If you use a stepladder:  Make sure that it is fully  opened. Do not climb a closed stepladder.  Make sure that both sides of the stepladder are locked into place.  Ask someone to hold it for you, if possible.  Clearly mark and make sure that you can see:  Any grab bars or handrails.  First and last steps.  Where the edge of each step is.  Use tools that help you move around (mobility aids) if they are needed. These include:  Canes.  Walkers.  Scooters.  Crutches.  Turn on the lights when you go into a dark area. Replace any light bulbs as soon as they burn out.  Set up your furniture so you have a clear path. Avoid moving your furniture around.  If any of your floors are uneven, fix them.  If there are any pets around you, be aware of where they are.  Review your medicines with your doctor. Some medicines can make you feel dizzy. This can increase your chance of falling. Ask your doctor what other things that you can do to help prevent falls. This information is not intended to replace advice given to you by your health care provider. Make sure you discuss any questions you have with your health care provider. Document Released: 11/22/2008 Document Revised: 07/04/2015 Document Reviewed: 03/02/2014 Elsevier Interactive Patient Education  2017 [employer].

## 2019-03-05 DIAGNOSIS — R69 Illness, unspecified: Secondary | ICD-10-CM | POA: Diagnosis not present

## 2019-03-10 ENCOUNTER — Other Ambulatory Visit: Payer: Self-pay

## 2019-03-10 DIAGNOSIS — E7800 Pure hypercholesterolemia, unspecified: Secondary | ICD-10-CM

## 2019-03-10 DIAGNOSIS — E785 Hyperlipidemia, unspecified: Secondary | ICD-10-CM

## 2019-03-10 DIAGNOSIS — I10 Essential (primary) hypertension: Secondary | ICD-10-CM

## 2019-03-10 MED ORDER — CARVEDILOL 6.25 MG PO TABS: 6.2500 mg | ORAL_TABLET | Freq: Two times a day (BID) | ORAL | 0 refills | Status: DC

## 2019-03-10 MED ORDER — PRAVASTATIN SODIUM 40 MG PO TABS: 40.0000 mg | ORAL_TABLET | Freq: Every day | ORAL | 0 refills | Status: DC

## 2019-03-23 ENCOUNTER — Other Ambulatory Visit: Payer: Self-pay

## 2019-03-23 DIAGNOSIS — E119 Type 2 diabetes mellitus without complications: Secondary | ICD-10-CM

## 2019-03-23 MED ORDER — METFORMIN HCL 500 MG PO TABS: ORAL_TABLET | ORAL | 0 refills | Status: DC

## 2019-05-01 ENCOUNTER — Other Ambulatory Visit: Payer: Self-pay

## 2019-05-01 DIAGNOSIS — E119 Type 2 diabetes mellitus without complications: Secondary | ICD-10-CM

## 2019-05-01 DIAGNOSIS — I10 Essential (primary) hypertension: Secondary | ICD-10-CM

## 2019-05-01 MED ORDER — OLMESARTAN MEDOXOMIL 5 MG PO TABS: 5.0000 mg | ORAL_TABLET | Freq: Every day | ORAL | 0 refills | Status: DC

## 2019-05-11 ENCOUNTER — Ambulatory Visit: Payer: Medicare HMO | Admitting: Family Medicine

## 2019-05-11 ENCOUNTER — Other Ambulatory Visit: Payer: Self-pay

## 2019-05-11 ENCOUNTER — Encounter: Payer: Self-pay | Admitting: Family Medicine

## 2019-05-11 VITALS — BP 126/70 | HR 68 | Ht 65.0 in | Wt 126.0 lb

## 2019-05-11 DIAGNOSIS — E785 Hyperlipidemia, unspecified: Secondary | ICD-10-CM | POA: Diagnosis not present

## 2019-05-11 DIAGNOSIS — I10 Essential (primary) hypertension: Secondary | ICD-10-CM

## 2019-05-11 DIAGNOSIS — D509 Iron deficiency anemia, unspecified: Secondary | ICD-10-CM

## 2019-05-11 DIAGNOSIS — E119 Type 2 diabetes mellitus without complications: Secondary | ICD-10-CM | POA: Diagnosis not present

## 2019-05-11 DIAGNOSIS — L80 Vitiligo: Secondary | ICD-10-CM

## 2019-05-11 DIAGNOSIS — E7800 Pure hypercholesterolemia, unspecified: Secondary | ICD-10-CM

## 2019-05-11 MED ORDER — HYDROCHLOROTHIAZIDE 25 MG PO TABS: 25.0000 mg | ORAL_TABLET | Freq: Every day | ORAL | 1 refills | Status: DC

## 2019-05-11 MED ORDER — ONETOUCH ULTRA VI STRP: ORAL_STRIP | 1 refills | Status: DC

## 2019-05-11 MED ORDER — CARVEDILOL 6.25 MG PO TABS: 6.2500 mg | ORAL_TABLET | Freq: Two times a day (BID) | ORAL | 1 refills | Status: DC

## 2019-05-11 MED ORDER — METFORMIN HCL 500 MG PO TABS: ORAL_TABLET | ORAL | 0 refills | Status: DC

## 2019-05-11 MED ORDER — FERROUS SULFATE 325 (65 FE) MG PO TABS: 325.0000 mg | ORAL_TABLET | ORAL | 1 refills | Status: DC

## 2019-05-11 MED ORDER — PRAVASTATIN SODIUM 40 MG PO TABS: 40.0000 mg | ORAL_TABLET | Freq: Every day | ORAL | 1 refills | Status: DC

## 2019-05-11 MED ORDER — OLMESARTAN MEDOXOMIL 5 MG PO TABS: 5.0000 mg | ORAL_TABLET | Freq: Every day | ORAL | 0 refills | Status: DC

## 2019-05-11 NOTE — Progress Notes (Signed)
Date:  05/11/2019   Name:  Vanessa Zamora   DOB:  [birth year]   MRN:  [id number]   Chief Complaint: Diabetes (follow up), Hypertension, Hyperlipidemia, and Anemia  Diabetes She presents for her follow-up diabetic visit. She has type 2 diabetes mellitus. Her disease course has been stable. There are no hypoglycemic associated symptoms. Pertinent negatives for hypoglycemia include no confusion, dizziness, headaches, hunger, mood changes, nervousness/anxiousness, pallor, seizures, sleepiness, speech difficulty, sweats or tremors. There are no diabetic associated symptoms. Pertinent negatives for diabetes include no blurred vision, no chest pain, no fatigue and no weight loss. There are no hypoglycemic complications. Symptoms are stable. There are no diabetic complications. Pertinent negatives for diabetic complications include no CVA, PVD or retinopathy. Risk factors for coronary artery disease include diabetes mellitus, hypertension and dyslipidemia. Current diabetic treatment includes oral agent (monotherapy). Her weight is stable. She is following a generally healthy diet. Meal planning includes avoidance of concentrated sweets and carbohydrate counting. Her home blood glucose trend is fluctuating minimally. Her breakfast blood glucose is taken between 8-9 am. Her breakfast blood glucose range is generally 90-110 mg/dl. An ACE inhibitor/angiotensin II receptor blocker is being taken.  Hypertension This is a chronic problem. The current episode started more than 1 year ago. The problem has been gradually improving since onset. The problem is controlled. Pertinent negatives include no blurred vision, chest pain, headaches, palpitations, PND, shortness of breath or sweats. Past treatments include beta blockers, alpha 1 blockers, angiotensin blockers and diuretics. The current treatment provides moderate improvement. There are no compliance problems.  There is no history of angina, kidney disease, CAD/MI,  CVA, heart failure, left ventricular hypertrophy, PVD or retinopathy. Identifiable causes of hypertension include a thyroid problem. There is no history of chronic renal disease, a hypertension causing med or renovascular disease.  Hyperlipidemia This is a chronic problem. The current episode started more than 1 year ago. The problem is controlled. Recent lipid tests were reviewed and are normal. Exacerbating diseases include diabetes and hypothyroidism. She has no history of chronic renal disease, liver disease, obesity or nephrotic syndrome. Factors aggravating her hyperlipidemia include thiazides. Pertinent negatives include no chest pain, focal sensory loss, focal weakness, leg pain, myalgias or shortness of breath. Current antihyperlipidemic treatment includes statins. The current treatment provides moderate improvement of lipids. There are no compliance problems.   Anemia Presents for follow-up visit. There has been no confusion, pallor, palpitations, paresthesias or weight loss. Signs of blood loss that are not present include vaginal bleeding. Past medical history includes hypothyroidism. There is no history of chronic renal disease or heart failure. There are no compliance problems.   Thyroid Problem Presents for follow-up visit. Patient reports no anxiety, depressed mood, diaphoresis, fatigue, palpitations, tremors or weight loss. The symptoms have been stable. Her past medical history is significant for diabetes and hyperlipidemia. There is no history of heart failure.    Lab Results  Component Value Date   CREATININE 0.81 01/10/2019   BUN 17 01/10/2019   NA 142 01/10/2019   K 4.1 01/10/2019   CL 104 01/10/2019   CO2 24 01/10/2019   Lab Results  Component Value Date   CHOL 196 01/26/2018   HDL 91 01/26/2018   LDLCALC 94 01/26/2018   TRIG 57 01/26/2018   CHOLHDL 2.2 01/26/2018   Lab Results  Component Value Date   TSH 1.71 06/02/2016   Lab Results  Component Value Date    HGBA1C 6.4 (H) 01/10/2019   Lab Results  Component Value Date   WBC 4.6 01/26/2018   HGB 11.9 01/26/2018   HCT 38.9 01/26/2018   MCV 78 (L) 01/26/2018   PLT 292 01/26/2018   No results found for: ALT, AST, GGT, ALKPHOS, BILITOT   Review of Systems  Constitutional: Negative for diaphoresis, fatigue and weight loss.  Eyes: Negative for blurred vision.  Respiratory: Negative for shortness of breath.   Cardiovascular: Negative for chest pain, palpitations and PND.  Genitourinary: Negative for vaginal bleeding.  Musculoskeletal: Negative for myalgias.  Skin: Negative for pallor.  Neurological: Negative for dizziness, tremors, focal weakness, seizures, speech difficulty, headaches and paresthesias.  Psychiatric/Behavioral: Negative for confusion. The patient is not nervous/anxious.     Patient Active Problem List   Diagnosis Date Noted   Myocardial infarction [facility]) 01/24/2018   Coronary artery disease 01/24/2018   Diabetes ([place]) 01/24/2018   Heart murmur 01/24/2018   Anemia 12/19/2015   Osteoarthritis of left glenohumeral joint 06/20/2015   Type 2 diabetes mellitus without complication, without long-term current use of insulin ([place]) 06/07/2014   Hypercholesterolemia 06/07/2014   Dyslipidemia 06/07/2014   Hypertension 06/07/2014   Thyrotoxicosis 06/07/2014   Routine general medical examination at a health care facility 06/07/2014   Screening for depression 06/07/2014    Allergies  Allergen Reactions   Ace Inhibitors Swelling   Altace  [Ramipril] Anaphylaxis   Clonidine Swelling   Losartan Potassium Other (See Comments)   Levothyroxine Rash    Past Surgical History:  Procedure Laterality Date   CARDIAC CATHETERIZATION  2005   CATARACT EXTRACTION Left 10/2014   CATARACT EXTRACTION W/PHACO Right 11/19/2014   Procedure: CATARACT EXTRACTION PHACO AND INTRAOCULAR LENS PLACEMENT ([place]);  Surgeon: Ronnell Freshwater, MD;  Location: [place];  Service: Ophthalmology;  Laterality: Right;  DIABETIC - oral meds   COLONOSCOPY WITH PROPOFOL N/A 06/30/2016   Procedure: COLONOSCOPY WITH PROPOFOL;  Surgeon: Lollie Sails, MD;  Location: [facility] ENDOSCOPY;  Service: Endoscopy;  Laterality: N/A;   EYE SURGERY     VAGINAL HYSTERECTOMY  02/10/1980    Social History   Tobacco Use   Smoking status: Former Smoker    Packs/day: 0.25    Years: 30.00    Pack years: 7.50    Types: Cigarettes    Quit date: 2003    Years since quitting: 18.2   Smokeless tobacco: Never Used   Tobacco comment: quit 2003  Substance Use Topics   Alcohol use: No    Alcohol/week: 0.0 standard drinks   Drug use: No     Medication list has been reviewed and updated.  Current Meds  Medication Sig   aspirin 81 MG tablet Take 1 tablet (81 mg total) by mouth daily.   Calcium Carbonate-Vit D-Min (CALTRATE 600+D PLUS MINERALS) 600-800 MG-UNIT TABS Take 1 tablet by mouth daily.   carvedilol (COREG) 6.25 MG tablet Take 1 tablet (6.25 mg total) by mouth 2 (two) times daily.   docusate sodium (COLACE) 100 MG capsule Take 100 mg by mouth daily as needed for mild constipation.   ferrous sulfate 325 (65 FE) MG tablet Take 1 tablet (325 mg total) by mouth every other day.   glucose blood (ONETOUCH ULTRA) test strip TEST ONCE DAILY   hydrochlorothiazide (HYDRODIURIL) 25 MG tablet Take 1 tablet (25 mg total) by mouth daily.   MEGARED OMEGA-3 KRILL OIL 500 MG CAPS Take 1 capsule by mouth 2 (two) times daily.   metFORMIN (GLUCOPHAGE) 500 MG tablet TAKE 1 TABLET BY MOUTH TWICE DAILY  Multiple Vitamins-Minerals (CENTRUM SILVER ULTRA WOMENS) TABS Take 1 tablet by mouth daily.   olmesartan (BENICAR) 5 MG tablet Take 1 tablet (5 mg total) by mouth daily.   pravastatin (PRAVACHOL) 40 MG tablet Take 1 tablet (40 mg total) by mouth daily.   SYNTHROID 25 MCG tablet Take 1 tablet by mouth daily. Dr Truddie Coco    [facility] 2/9 Scores 05/11/2019 02/15/2019 01/10/2019  05/19/2018  PHQ - 2 Score 0 0 0 0  PHQ- 9 Score 0 - 0 0    BP Readings from Last 3 Encounters:  05/11/19 126/70  01/10/19 130/78  09/16/18 132/76    Physical Exam  Wt Readings from Last 3 Encounters:  05/11/19 126 lb (57.2 kg)  02/15/19 127 lb (57.6 kg)  01/10/19 127 lb (57.6 kg)    BP 126/70    Pulse 68    Ht 5\' 5"  (1.651 m)    Wt 126 lb (57.2 kg)    BMI 20.97 kg/m   Assessment and Plan: 1. Diabetes mellitus without complication (HCC) Chronic.  Controlled.  Stable.  Continue Metformin 500 mg 1 twice a day.  Will check hemoglobin A1c microalbuminuria CMP as well as refilling patient's One Touch ultra test strips.  Uncomplicated. - Hemoglobin A1c - Microalbumin, urine - Comprehensive metabolic panel - Lipid Panel With LDL/HDL Ratio - glucose blood (ONETOUCH ULTRA) test strip; TEST ONCE DAILY  Dispense: 100 strip; Refill: 1  2. Dyslipidemia Chronic.  Controlled.  Stable.  Will continue pravastatin 40 mg once a day.  Reviewed previous lipid panel. - pravastatin (PRAVACHOL) 40 MG tablet; Take 1 tablet (40 mg total) by mouth daily.  Dispense: 90 tablet; Refill: 1  3. Hypercholesteremia .  Controlled.  Stable.  Continue pravastatin 40 mg once a day.  Continue with lipid panel to assess current LDL triglyceride levels. - Lipid Panel With LDL/HDL Ratio - pravastatin (PRAVACHOL) 40 MG tablet; Take 1 tablet (40 mg total) by mouth daily.  Dispense: 90 tablet; Refill: 1  4. Essential (primary) hypertension Chronic.  Controlled.  Stable.  Continue hydrochlorothiazide 25 mg and carvedilol 6.25 mg twice a day, and Benicar 5 mg once a day.  Will check CMP to assess GFR. - Comprehensive metabolic panel - hydrochlorothiazide (HYDRODIURIL) 25 MG tablet; Take 1 tablet (25 mg total) by mouth daily.  Dispense: 90 tablet; Refill: 1 - carvedilol (COREG) 6.25 MG tablet; Take 1 tablet (6.25 mg total) by mouth 2 (two) times daily.  Dispense: 180 tablet; Refill: 1  5. Type 2 diabetes mellitus without  complication, without long-term current use of insulin (HCC) Chronic.  Controlled.  Stable.  Continue Metformin 500 mg twice a day. - olmesartan (BENICAR) 5 MG tablet; Take 1 tablet (5 mg total) by mouth daily.  Dispense: 90 tablet; Refill: 0 - metFORMIN (GLUCOPHAGE) 500 MG tablet; TAKE 1 TABLET BY MOUTH TWICE DAILY  Dispense: 180 tablet; Refill: 0  6. Essential hypertension As noted above - Comprehensive metabolic panel - olmesartan (BENICAR) 5 MG tablet; Take 1 tablet (5 mg total) by mouth daily.  Dispense: 90 tablet; Refill: 0  7. Iron deficiency anemia, unspecified iron deficiency anemia type Chronic.  Controlled.  Stable.  Continue ferrous sulfate 325 mg daily.  Will check CBC and indices for assessment of current anemic state - CBC with Differential/Platelet - ferrous sulfate 325 (65 FE) MG tablet; Take 1 tablet (325 mg total) by mouth every other day.  Dispense: 90 tablet; Refill: 1  8. Vitiligo New onset.  Patient is noted multiple areas  of depigmentation of his extremities and torso.  Some of these are circular and total clearing of pigment which is likely to be vitiligo we will refer to dermatology for evaluation and treatment. - Ambulatory referral to Dermatology

## 2019-05-12 LAB — LIPID PANEL WITH LDL/HDL RATIO
Cholesterol, Total: 198 mg/dL (ref 100–199)
HDL: 92 mg/dL (ref >39)
LDL Chol Calc (NIH): 97 mg/dL (ref 0–99)
LDL/HDL Ratio: 1.1 ratio (ref 0.0–3.2)
Triglycerides: 49 mg/dL (ref 0–149)
VLDL Cholesterol Cal: 9 mg/dL (ref 5–40)

## 2019-05-12 LAB — CBC WITH DIFFERENTIAL/PLATELET
Basophils Absolute: 0 10*3/uL (ref 0.0–0.2)
Basos: 1 %
EOS (ABSOLUTE): 0.1 10*3/uL (ref 0.0–0.4)
Eos: 1 %
Hematocrit: 37.6 % (ref 34.0–46.6)
Hemoglobin: 11.4 g/dL (ref 11.1–15.9)
Immature Grans (Abs): 0 10*3/uL (ref 0.0–0.1)
Immature Granulocytes: 0 %
Lymphocytes Absolute: 1.6 10*3/uL (ref 0.7–3.1)
Lymphs: 38 %
MCH: 23.5 pg — ABNORMAL LOW (ref 26.6–33.0)
MCHC: 30.3 g/dL — ABNORMAL LOW (ref 31.5–35.7)
MCV: 77 fL — ABNORMAL LOW (ref 79–97)
Monocytes Absolute: 0.5 10*3/uL (ref 0.1–0.9)
Monocytes: 11 %
Neutrophils Absolute: 2 10*3/uL (ref 1.4–7.0)
Neutrophils: 49 %
Platelets: 273 10*3/uL (ref 150–450)
RBC: 4.86 x10E6/uL (ref 3.77–5.28)
RDW: 14.9 % (ref 11.7–15.4)
WBC: 4.2 10*3/uL (ref 3.4–10.8)

## 2019-05-12 LAB — COMPREHENSIVE METABOLIC PANEL
ALT: 13 IU/L (ref 0–32)
AST: 19 IU/L (ref 0–40)
Albumin/Globulin Ratio: 1.9 (ref 1.2–2.2)
Albumin: 4.4 g/dL (ref 3.7–4.7)
Alkaline Phosphatase: 44 IU/L (ref 39–117)
BUN/Creatinine Ratio: 24 (ref 12–28)
BUN: 20 mg/dL (ref 8–27)
Bilirubin Total: 0.3 mg/dL (ref 0.0–1.2)
CO2: 25 mmol/L (ref 20–29)
Calcium: 9.6 mg/dL (ref 8.7–10.3)
Chloride: 106 mmol/L (ref 96–106)
Creatinine, Ser: 0.85 mg/dL (ref 0.57–1.00)
GFR calc Af Amer: 75 mL/min/{1.73_m2} (ref >59)
GFR calc non Af Amer: 65 mL/min/{1.73_m2} (ref >59)
Globulin, Total: 2.3 g/dL (ref 1.5–4.5)
Glucose: 96 mg/dL (ref 65–99)
Potassium: 4 mmol/L (ref 3.5–5.2)
Sodium: 146 mmol/L — ABNORMAL HIGH (ref 134–144)
Total Protein: 6.7 g/dL (ref 6.0–8.5)

## 2019-05-12 LAB — MICROALBUMIN, URINE: Microalbumin, Urine: 22.6 ug/mL

## 2019-05-12 LAB — HEMOGLOBIN A1C
Est. average glucose Bld gHb Est-mCnc: 134 mg/dL
Hgb A1c MFr Bld: 6.3 % — ABNORMAL HIGH (ref 4.8–5.6)

## 2019-09-13 ENCOUNTER — Ambulatory Visit: Payer: Medicare HMO | Admitting: Family Medicine

## 2019-09-13 ENCOUNTER — Other Ambulatory Visit: Payer: Self-pay

## 2019-09-13 ENCOUNTER — Encounter: Payer: Self-pay | Admitting: Family Medicine

## 2019-09-13 VITALS — BP 136/72 | HR 63 | Ht 65.0 in | Wt 127.0 lb

## 2019-09-13 DIAGNOSIS — E785 Hyperlipidemia, unspecified: Secondary | ICD-10-CM

## 2019-09-13 DIAGNOSIS — D509 Iron deficiency anemia, unspecified: Secondary | ICD-10-CM

## 2019-09-13 DIAGNOSIS — I10 Essential (primary) hypertension: Secondary | ICD-10-CM | POA: Diagnosis not present

## 2019-09-13 DIAGNOSIS — E034 Atrophy of thyroid (acquired): Secondary | ICD-10-CM

## 2019-09-13 DIAGNOSIS — E119 Type 2 diabetes mellitus without complications: Secondary | ICD-10-CM | POA: Diagnosis not present

## 2019-09-13 DIAGNOSIS — E7800 Pure hypercholesterolemia, unspecified: Secondary | ICD-10-CM

## 2019-09-13 MED ORDER — OLMESARTAN MEDOXOMIL 5 MG PO TABS: 5.0000 mg | ORAL_TABLET | Freq: Every day | ORAL | 0 refills | Status: DC

## 2019-09-13 MED ORDER — HYDROCHLOROTHIAZIDE 25 MG PO TABS: 25.0000 mg | ORAL_TABLET | Freq: Every day | ORAL | 1 refills | Status: DC

## 2019-09-13 MED ORDER — METFORMIN HCL 500 MG PO TABS: ORAL_TABLET | ORAL | 1 refills | Status: DC

## 2019-09-13 MED ORDER — CARVEDILOL 6.25 MG PO TABS: 6.2500 mg | ORAL_TABLET | Freq: Two times a day (BID) | ORAL | 1 refills | Status: DC

## 2019-09-13 MED ORDER — PRAVASTATIN SODIUM 40 MG PO TABS: 40.0000 mg | ORAL_TABLET | Freq: Every day | ORAL | 1 refills | Status: DC

## 2019-09-13 NOTE — Progress Notes (Signed)
Date:  09/13/2019   Name:  Vanessa Zamora   DOB:  04/21/1940   MRN:  [id number]   Chief Complaint: Hyperlipidemia (follow up ), Hypertension, Diabetes (last reading 110 this morning), and Anemia  Hyperlipidemia This is a chronic problem. The current episode started more than 1 year ago. The problem is controlled. Recent lipid tests were reviewed and are normal. She has no history of chronic renal disease, diabetes, hypothyroidism, liver disease, obesity or nephrotic syndrome. There are no known factors aggravating her hyperlipidemia. Pertinent negatives include no chest pain, focal sensory loss, focal weakness, leg pain, myalgias or shortness of breath. She is currently on no antihyperlipidemic treatment. The current treatment provides moderate improvement of lipids. There are no compliance problems.  Risk factors for coronary artery disease include dyslipidemia, diabetes mellitus and hypertension.  Hypertension This is a chronic problem. The current episode started more than 1 year ago. The problem has been gradually improving since onset. The problem is controlled. Pertinent negatives include no anxiety, blurred vision, chest pain, headaches, malaise/fatigue, neck pain, orthopnea, palpitations, peripheral edema, PND, shortness of breath or sweats. There are no associated agents to hypertension. There are no known risk factors for coronary artery disease. Past treatments include angiotensin blockers and diuretics. The current treatment provides moderate improvement. There are no compliance problems.  There is no history of angina, kidney disease, CAD/MI, CVA, heart failure, left ventricular hypertrophy, PVD or retinopathy. Identifiable causes of hypertension include a thyroid problem. There is no history of chronic renal disease, a hypertension causing med or renovascular disease.  Diabetes She presents for her follow-up diabetic visit. She has type 2 diabetes mellitus. Her disease course has been  stable. There are no hypoglycemic associated symptoms. Pertinent negatives for hypoglycemia include no dizziness, headaches, nervousness/anxiousness or sweats. Pertinent negatives for diabetes include no blurred vision, no chest pain, no fatigue, no foot paresthesias, no foot ulcerations, no polydipsia, no polyphagia, no polyuria, no visual change, no weakness and no weight loss. There are no hypoglycemic complications. Symptoms are stable. There are no diabetic complications. Pertinent negatives for diabetic complications include no CVA, PVD or retinopathy. Current diabetic treatment includes oral agent (monotherapy). She is compliant with treatment all of the time. Her weight is stable. She is following a generally healthy diet. Meal planning includes avoidance of concentrated sweets and carbohydrate counting. She participates in exercise intermittently. Her home blood glucose trend is fluctuating minimally. Her breakfast blood glucose range is generally 90-110 mg/dl. An ACE inhibitor/angiotensin II receptor blocker is being taken. Eye exam is current.  Anemia Presents for follow-up visit. There has been no abdominal pain, anorexia, bruising/bleeding easily, fever, malaise/fatigue, palpitations or weight loss. Signs of blood loss that are not present include hematemesis, hematochezia, melena and vaginal bleeding. There is no history of chronic renal disease, heart failure or hypothyroidism. There are no compliance problems.   Thyroid Problem Presents for follow-up visit. Patient reports no anxiety, cold intolerance, constipation, diarrhea, dry skin, fatigue, hair loss, heat intolerance, hoarse voice, leg swelling, menstrual problem, palpitations, visual change, weight gain or weight loss. The symptoms have been stable. Her past medical history is significant for hyperlipidemia. There is no history of diabetes or heart failure.    Lab Results  Component Value Date   CREATININE 0.85 05/11/2019   BUN 20  05/11/2019   NA 146 (H) 05/11/2019   K 4.0 05/11/2019   CL 106 05/11/2019   CO2 25 05/11/2019   Lab Results  Component Value Date  CHOL 198 05/11/2019   HDL 92 05/11/2019   LDLCALC 97 05/11/2019   TRIG 49 05/11/2019   CHOLHDL 2.2 01/26/2018   Lab Results  Component Value Date   TSH 1.71 06/02/2016   Lab Results  Component Value Date   HGBA1C 6.3 (H) 05/11/2019   Lab Results  Component Value Date   WBC 4.2 05/11/2019   HGB 11.4 05/11/2019   HCT 37.6 05/11/2019   MCV 77 (L) 05/11/2019   PLT 273 05/11/2019   Lab Results  Component Value Date   ALT 13 05/11/2019   AST 19 05/11/2019   ALKPHOS 44 05/11/2019   BILITOT 0.3 05/11/2019     Review of Systems  Constitutional: Negative for chills, fatigue, fever, malaise/fatigue, weight gain and weight loss.  HENT: Negative for drooling, ear discharge, ear pain, hoarse voice and sore throat.   Eyes: Negative for blurred vision.  Respiratory: Negative for cough, shortness of breath and wheezing.   Cardiovascular: Negative for chest pain, palpitations, orthopnea, leg swelling and PND.  Gastrointestinal: Negative for abdominal pain, anorexia, blood in stool, constipation, diarrhea, hematemesis, hematochezia, melena and nausea.  Endocrine: Negative for cold intolerance, heat intolerance, polydipsia, polyphagia and polyuria.  Genitourinary: Negative for dysuria, frequency, hematuria, menstrual problem, urgency and vaginal bleeding.  Musculoskeletal: Negative for back pain, myalgias and neck pain.  Skin: Negative for rash.  Allergic/Immunologic: Negative for environmental allergies.  Neurological: Negative for dizziness, focal weakness, weakness and headaches.  Hematological: Does not bruise/bleed easily.  Psychiatric/Behavioral: Negative for suicidal ideas. The patient is not nervous/anxious.     Patient Active Problem List   Diagnosis Date Noted  . Myocardial infarction ([place]) 01/24/2018  . Coronary artery disease 01/24/2018   . Diabetes ([place]) 01/24/2018  . Heart murmur 01/24/2018  . Anemia 12/19/2015  . Osteoarthritis of left glenohumeral joint 06/20/2015  . Type 2 diabetes mellitus without complication, without long-term current use of insulin ([place]) 06/07/2014  . Hypercholesterolemia 06/07/2014  . Dyslipidemia 06/07/2014  . Hypertension 06/07/2014  . 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 Routine general medical examination at a health care facility 06/07/2014  . Screening for depression 06/07/2014    Allergies  Allergen Reactions  . Ace Inhibitors Swelling  . Altace  [Ramipril] Anaphylaxis  . Clonidine Swelling  . Losartan Potassium Other (See Comments)  . Levothyroxine Rash    Past Surgical History:  Procedure Laterality Date  . CARDIAC CATHETERIZATION  2005  . CATARACT EXTRACTION Left 10/2014  . CATARACT EXTRACTION W/PHACO Right 11/19/2014   Procedure: CATARACT EXTRACTION PHACO AND INTRAOCULAR LENS PLACEMENT ([place]);  Surgeon: Ronnell Freshwater, MD;  Location: [place];  Service: Ophthalmology;  Laterality: Right;  DIABETIC - oral meds  . COLONOSCOPY WITH PROPOFOL N/A 06/30/2016   Procedure: COLONOSCOPY WITH PROPOFOL;  Surgeon: Lollie Sails, MD;  Location[facility] ENDOSCOPY;  Service: Endoscopy;  Laterality: N/A;  . EYE SURGERY    . VAGINAL HYSTERECTOMY  02/10/1980    Social History   Tobacco Use  . Smoking status: Former Smoker    Packs/day: 0.25    Years: 30.00    Pack years: 7.50    Types: Cigarettes    Quit date: 2003    Years since quitting: 18.6  . Smokeless tobacco: Never Used  . Tobacco comment: quit 2003  Vaping Use  . Vaping Use: Never used  Substance Use Topics  . Alcohol use: No    Alcohol/week: 0.0 standard drinks  . Drug use: No     Medication list has been reviewed  and updated.  Current Meds  Medication Sig  . aspirin 81 MG tablet Take 1 tablet (81 mg total) by mouth daily.  . Calcium Carbonate-Vit D-Min (CALTRATE 600+D PLUS MINERALS) 600-800 MG-UNIT  TABS Take 1 tablet by mouth daily.  . carvedilol (COREG) 6.25 MG tablet Take 1 tablet (6.25 mg total) by mouth 2 (two) times daily.  Marland Kitchen docusate sodium (COLACE) 100 MG capsule Take 100 mg by mouth daily as needed for mild constipation.  . ferrous sulfate 325 (65 FE) MG tablet Take 1 tablet (325 mg total) by mouth every other day.  Marland Kitchen glucose blood (ONETOUCH ULTRA) test strip TEST ONCE DAILY  . hydrochlorothiazide (HYDRODIURIL) 25 MG tablet Take 1 tablet (25 mg total) by mouth daily.  . metFORMIN (GLUCOPHAGE) 500 MG tablet TAKE 1 TABLET BY MOUTH TWICE DAILY  . Multiple Vitamins-Minerals (CENTRUM SILVER ULTRA WOMENS) TABS Take 1 tablet by mouth daily.  Marland Kitchen olmesartan (BENICAR) 5 MG tablet Take 1 tablet (5 mg total) by mouth daily.  . Omega-3 Fatty Acids (FISH OIL) 1000 MG CAPS Take by mouth.  . pravastatin (PRAVACHOL) 40 MG tablet Take 1 tablet (40 mg total) by mouth daily.  Marland Kitchen SYNTHROID 25 MCG tablet Take 1 tablet by mouth daily. Dr Truddie Coco    [facility] 2/9 Scores 09/13/2019 05/11/2019 02/15/2019 01/10/2019  PHQ - 2 Score 0 0 0 0  PHQ- 9 Score 1 0 - 0    GAD 7 : Generalized Anxiety Score 09/13/2019 05/11/2019  Nervous, Anxious, on Edge 0 0  Control/stop worrying 0 0  Worry too much - different things 0 0  Trouble relaxing 0 0  Restless 0 0  Easily annoyed or irritable 0 0  Afraid - awful might happen 0 0  Total GAD 7 Score 0 0  Anxiety Difficulty Not difficult at all Not difficult at all    BP Readings from Last 3 Encounters:  09/13/19 136/72  05/11/19 126/70  01/10/19 130/78    Physical Exam Vitals reviewed.  Constitutional:      Appearance: She is well-developed.  HENT:     Head: Normocephalic.     Right Ear: External ear normal.     Left Ear: External ear normal.  Eyes:     General: Lids are everted, no foreign bodies appreciated. No scleral icterus.       Left eye: No foreign body or hordeolum.     Conjunctiva/sclera: Conjunctivae normal.     Right eye: Right conjunctiva is not  injected.     Left eye: Left conjunctiva is not injected.     Pupils: Pupils are equal, round, and reactive to light.  Neck:     Thyroid: No thyromegaly.     Vascular: No JVD.     Trachea: No tracheal deviation.  Cardiovascular:     Rate and Rhythm: Normal rate and regular rhythm.     Heart sounds: Normal heart sounds. No murmur heard.  No friction rub. No gallop.   Pulmonary:     Effort: Pulmonary effort is normal. No respiratory distress.     Breath sounds: Normal breath sounds. No wheezing or rales.  Abdominal:     General: Bowel sounds are normal.     Palpations: Abdomen is soft. There is no mass.     Tenderness: There is no abdominal tenderness. There is no guarding or rebound.  Musculoskeletal:        General: No tenderness. Normal range of motion.     Cervical back: Normal range of motion  and neck supple.  Lymphadenopathy:     Cervical: No cervical adenopathy.  Skin:    General: Skin is warm.     Findings: No rash.  Neurological:     Mental Status: She is alert and oriented to person, place, and time.     Cranial Nerves: No cranial nerve deficit.     Deep Tendon Reflexes: Reflexes normal.  Psychiatric:        Mood and Affect: Mood is not anxious or depressed.     Wt Readings from Last 3 Encounters:  09/13/19 127 lb (57.6 kg)  05/11/19 126 lb (57.2 kg)  02/15/19 127 lb (57.6 kg)    BP 136/72   Pulse 63   Ht 5\' 5"  (1.651 m)   Wt 127 lb (57.6 kg)   SpO2 98%   BMI 21.13 kg/m   Assessment and Plan: 1. Type 2 diabetes mellitus without complication, without long-term current use of insulin (HCC) Chronic.  Controlled.  Stable.  Continue Metformin 500 mg twice a day.  We will check A1c and renal function panel. - Hemoglobin A1c - olmesartan (BENICAR) 5 MG tablet; Take 1 tablet (5 mg total) by mouth daily.  Dispense: 90 tablet; Refill: 0 - metFORMIN (GLUCOPHAGE) 500 MG tablet; TAKE 1 TABLET BY MOUTH TWICE DAILY  Dispense: 180 tablet; Refill: 1  2. Essential  hypertension Chronic.  Controlled.  Stable.  Continue olmesartan 5 mg once a day.  Will check renal function panel. - Renal Function Panel - olmesartan (BENICAR) 5 MG tablet; Take 1 tablet (5 mg total) by mouth daily.  Dispense: 90 tablet; Refill: 0  3. Hypercholesteremia Chronic.  Controlled.  Stable.  Continue pravastatin 40 mg once a day.  Will check pravastatin for current LDL range. - Lipid Panel With LDL/HDL Ratio - pravastatin (PRAVACHOL) 40 MG tablet; Take 1 tablet (40 mg total) by mouth daily.  Dispense: 90 tablet; Refill: 1  4. Iron deficiency anemia, unspecified iron deficiency anemia type Panic.  Controlled.  Stable.  Continue ferrous sulfate supplementation will.  Will evaluate level with CBC. 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  Controlled.  Stable.  Will check TSH and pending level will continue current level of levothyroxine. - TSH  6. Dyslipidemia As noted above - pravastatin (PRAVACHOL) 40 MG tablet; Take 1 tablet (40 mg total) by mouth daily.  Dispense: 90 tablet; Refill: 1  7. Essential (primary) hypertension Chronic.  Controlled.  Stable.  In addition to the olmesartan we will continue hydrochlorothiazide 25 mg carvedilol to 6.25 twice a day. - hydrochlorothiazide (HYDRODIURIL) 25 MG tablet; Take 1 tablet (25 mg total) by mouth daily.  Dispense: 90 tablet; Refill: 1 - carvedilol (COREG) 6.25 MG tablet; Take 1 tablet (6.25 mg total) by mouth 2 (two) times daily.  Dispense: 180 tablet; Refill: 1

## 2019-09-14 LAB — RENAL FUNCTION PANEL
Albumin: 4.4 g/dL (ref 3.6–4.6)
BUN/Creatinine Ratio: 28 (ref 12–28)
BUN: 20 mg/dL (ref 8–27)
CO2: 25 mmol/L (ref 20–29)
Calcium: 9.4 mg/dL (ref 8.7–10.3)
Chloride: 103 mmol/L (ref 96–106)
Creatinine, Ser: 0.72 mg/dL (ref 0.57–1.00)
GFR calc Af Amer: 91 mL/min/{1.73_m2} (ref >59)
GFR calc non Af Amer: 79 mL/min/{1.73_m2} (ref >59)
Glucose: 92 mg/dL (ref 65–99)
Phosphorus: 3.7 mg/dL (ref 3.0–4.3)
Potassium: 3.9 mmol/L (ref 3.5–5.2)
Sodium: 142 mmol/L (ref 134–144)

## 2019-09-14 LAB — CBC WITH DIFFERENTIAL/PLATELET
Basophils Absolute: 0 10*3/uL (ref 0.0–0.2)
Basos: 0 %
EOS (ABSOLUTE): 0.1 10*3/uL (ref 0.0–0.4)
Eos: 1 %
Hematocrit: 37.1 % (ref 34.0–46.6)
Hemoglobin: 11.7 g/dL (ref 11.1–15.9)
Immature Grans (Abs): 0 10*3/uL (ref 0.0–0.1)
Immature Granulocytes: 0 %
Lymphocytes Absolute: 1.6 10*3/uL (ref 0.7–3.1)
Lymphs: 33 %
MCH: 23.7 pg — ABNORMAL LOW (ref 26.6–33.0)
MCHC: 31.5 g/dL (ref 31.5–35.7)
MCV: 75 fL — ABNORMAL LOW (ref 79–97)
Monocytes Absolute: 0.4 10*3/uL (ref 0.1–0.9)
Monocytes: 9 %
Neutrophils Absolute: 2.7 10*3/uL (ref 1.4–7.0)
Neutrophils: 57 %
Platelets: 242 10*3/uL (ref 150–450)
RBC: 4.94 x10E6/uL (ref 3.77–5.28)
RDW: 14.7 % (ref 11.7–15.4)
WBC: 4.7 10*3/uL (ref 3.4–10.8)

## 2019-09-14 LAB — LIPID PANEL WITH LDL/HDL RATIO
Cholesterol, Total: 201 mg/dL — ABNORMAL HIGH (ref 100–199)
HDL: 95 mg/dL (ref >39)
LDL Chol Calc (NIH): 96 mg/dL (ref 0–99)
LDL/HDL Ratio: 1 ratio (ref 0.0–3.2)
Triglycerides: 55 mg/dL (ref 0–149)
VLDL Cholesterol Cal: 10 mg/dL (ref 5–40)

## 2019-09-14 LAB — HEMOGLOBIN A1C
Est. average glucose Bld gHb Est-mCnc: 140 mg/dL
Hgb A1c MFr Bld: 6.5 % — ABNORMAL HIGH (ref 4.8–5.6)

## 2019-09-14 LAB — TSH: TSH: 1.84 u[IU]/mL (ref 0.450–4.500)

## 2019-11-05 ENCOUNTER — Other Ambulatory Visit: Payer: Self-pay | Admitting: Family Medicine

## 2019-11-05 DIAGNOSIS — E119 Type 2 diabetes mellitus without complications: Secondary | ICD-10-CM

## 2019-11-05 NOTE — Telephone Encounter (Signed)
Requested Prescriptions  Pending Prescriptions Disp Refills   ONETOUCH ULTRA test strip [Pharmacy Med Name: [place] TESTST(NEW)100] 100 strip 1    Sig: TEST ONCE DAILY     Endocrinology: Diabetes - Testing Supplies Passed - 11/05/2019  3:34 AM      Passed - Valid encounter within last 12 months    Recent Outpatient Visits          1 month ago Type 2 diabetes mellitus without complication, without long-term current use of insulin ([place])   [facility] Juline Patch, MD   5 months ago Diabetes mellitus without complication ([place])   [facility] Juline Patch, MD   9 months ago Annual physical exam   [place], Deanna C, MD   1 year ago Dyslipidemia   [place], Deanna C, MD   1 year ago Type 2 diabetes mellitus without complication, without long-term current use of insulin ([place])   [facility] Juline Patch, MD      Future Appointments            In 2 months Juline Patch, MD [facility], [facility]

## 2019-11-06 ENCOUNTER — Other Ambulatory Visit: Payer: Self-pay | Admitting: Family Medicine

## 2019-11-06 DIAGNOSIS — E119 Type 2 diabetes mellitus without complications: Secondary | ICD-10-CM

## 2019-11-06 DIAGNOSIS — I10 Essential (primary) hypertension: Secondary | ICD-10-CM

## 2019-11-06 NOTE — Telephone Encounter (Signed)
Last ordered 09/13/19. Should have medication to last until 12/14/19. Request refused due to requesting too early.

## 2019-11-06 NOTE — Telephone Encounter (Signed)
Medication Refill - Medication: olmesartan   Has the patient contacted their pharmacy? Yes.   (Agent: If no, request that the patient contact the pharmacy for the refill.) (Agent: If yes, when and what did the pharmacy advise?)  Preferred Pharmacy (with phone number or street name):  [facility] [place], [place] [address] [place] [address]  Phone: [phone] Fax: [phone]  Hours: Not open 24 hours    Agent: Please be advised that RX refills may take up to 3 business days. We ask that you follow-up with your pharmacy.

## 2019-11-08 ENCOUNTER — Other Ambulatory Visit: Payer: Self-pay | Admitting: Family Medicine

## 2019-11-08 DIAGNOSIS — I10 Essential (primary) hypertension: Secondary | ICD-10-CM

## 2019-11-08 DIAGNOSIS — E119 Type 2 diabetes mellitus without complications: Secondary | ICD-10-CM

## 2019-11-08 NOTE — Telephone Encounter (Signed)
Requested Prescriptions  Pending Prescriptions Disp Refills   olmesartan (BENICAR) 5 MG tablet [Pharmacy Med Name: OLMESARTAN MEDOXOMIL 5MG  TABLETS] 90 tablet 0    Sig: TAKE 1 TABLET(5 MG) BY MOUTH DAILY     Cardiovascular:  Angiotensin Receptor Blockers Passed - 11/08/2019  9:49 AM      Passed - Cr in normal range and within 180 days    Creatinine, Ser  Date Value Ref Range Status  09/13/2019 0.72 0.57 - 1.00 mg/dL Final         Passed - K in normal range and within 180 days    Potassium  Date Value Ref Range Status  09/13/2019 3.9 3.5 - 5.2 mmol/L Final         Passed - Patient is not pregnant      Passed - Last BP in normal range    BP Readings from Last 1 Encounters:  09/13/19 136/72         Passed - Valid encounter within last 6 months    Recent Outpatient Visits          1 month ago Type 2 diabetes mellitus without complication, without long-term current use of insulin ([place])   [facility] Juline Patch, MD   6 months ago Diabetes mellitus without complication ([place])   [facility] Juline Patch, MD   10 months ago Annual physical exam   [place], Deanna C, MD   1 year ago Dyslipidemia   [employer], Deanna C, MD   1 year ago Type 2 diabetes mellitus without complication, without long-term current use of insulin [facility])   [facility] Juline Patch, MD      Future Appointments            In 2 months Juline Patch, MD [facility], [facility]           allergy warning well documented with past reorders.

## 2019-11-13 ENCOUNTER — Other Ambulatory Visit: Payer: Self-pay | Admitting: Family Medicine

## 2019-11-13 DIAGNOSIS — D509 Iron deficiency anemia, unspecified: Secondary | ICD-10-CM

## 2019-11-13 NOTE — Telephone Encounter (Signed)
Requested Prescriptions  Pending Prescriptions Disp Refills   FEROSUL 325 (65 Fe) MG tablet [Pharmacy Med Name: FERROUS SULFATE 325MG  (5GR) TABS] 90 tablet 0    Sig: TAKE 1 TABLET BY MOUTH EVERY OTHER DAY     Endocrinology:  Minerals - Iron Supplementation Failed - 11/13/2019  3:34 AM      Failed - Fe (serum) in normal range and within 360 days    Iron  Date Value Ref Range Status  09/23/2015 79 27 - 139 ug/dL Final         Failed - Ferritin in normal range and within 360 days    Ferritin  Date Value Ref Range Status  09/23/2015 28 15.0 - 150.0 ng/mL Final         Passed - HGB in normal range and within 360 days    Hemoglobin  Date Value Ref Range Status  09/13/2019 11.7 11.1 - 15.9 g/dL Final         Passed - HCT in normal range and within 360 days    Hematocrit  Date Value Ref Range Status  09/13/2019 37.1 34.0 - 46.6 % Final         Passed - RBC in normal range and within 360 days    RBC  Date Value Ref Range Status  09/13/2019 4.94 3.77 - 5.28 x10E6/uL Final         Passed - Valid encounter within last 12 months    Recent Outpatient Visits          2 months ago Type 2 diabetes mellitus without complication, without long-term current use of insulin ([place])   [facility] Juline Patch, MD   6 months ago Diabetes mellitus without complication ([place])   [facility] Juline Patch, MD   10 months ago Annual physical exam   [place], Deanna C, MD   1 year ago Dyslipidemia   [place], Deanna C, MD   1 year ago Type 2 diabetes mellitus without complication, without long-term current use of insulin ([place])   [facility] Juline Patch, MD      Future Appointments            In 2 months Juline Patch, MD [facility], [facility]

## 2019-11-14 ENCOUNTER — Ambulatory Visit: Payer: Medicare HMO

## 2019-11-14 ENCOUNTER — Ambulatory Visit (INDEPENDENT_AMBULATORY_CARE_PROVIDER_SITE_OTHER): Payer: Medicare HMO

## 2019-11-14 ENCOUNTER — Other Ambulatory Visit: Payer: Self-pay

## 2019-11-14 DIAGNOSIS — Z23 Encounter for immunization: Secondary | ICD-10-CM

## 2019-11-17 LAB — HM DIABETES EYE EXAM

## 2020-01-08 ENCOUNTER — Other Ambulatory Visit: Payer: Self-pay | Admitting: Family Medicine

## 2020-01-08 DIAGNOSIS — Z1231 Encounter for screening mammogram for malignant neoplasm of breast: Secondary | ICD-10-CM

## 2020-01-09 ENCOUNTER — Other Ambulatory Visit: Payer: Self-pay | Admitting: Family Medicine

## 2020-01-09 DIAGNOSIS — E119 Type 2 diabetes mellitus without complications: Secondary | ICD-10-CM

## 2020-01-09 MED ORDER — METFORMIN HCL 500 MG PO TABS: ORAL_TABLET | ORAL | 1 refills | Status: DC

## 2020-01-09 NOTE — Telephone Encounter (Signed)
Patient received a reminder text from her pharmacy that her 90 day supply of metFORMIN (GLUCOPHAGE) 500 MG tablet is due, patient states pharmacy does not have Rx and would like PCP to send in. Informed patient please allow 48 to 72 hour turn around time    [place], [place] [address] Phone:  [phone]  Fax:  [phone]

## 2020-01-12 ENCOUNTER — Other Ambulatory Visit: Payer: Self-pay | Admitting: Family Medicine

## 2020-01-12 DIAGNOSIS — E119 Type 2 diabetes mellitus without complications: Secondary | ICD-10-CM

## 2020-01-23 ENCOUNTER — Encounter: Payer: Self-pay | Admitting: Family Medicine

## 2020-01-23 ENCOUNTER — Other Ambulatory Visit: Payer: Self-pay

## 2020-01-23 ENCOUNTER — Ambulatory Visit: Payer: Medicare HMO | Admitting: Family Medicine

## 2020-01-23 VITALS — BP 130/80 | HR 64 | Ht 65.0 in | Wt 125.0 lb

## 2020-01-23 DIAGNOSIS — E119 Type 2 diabetes mellitus without complications: Secondary | ICD-10-CM

## 2020-01-23 DIAGNOSIS — Z1239 Encounter for other screening for malignant neoplasm of breast: Secondary | ICD-10-CM | POA: Diagnosis not present

## 2020-01-23 MED ORDER — METFORMIN HCL 500 MG PO TABS: ORAL_TABLET | ORAL | 1 refills | Status: DC

## 2020-01-23 NOTE — Progress Notes (Signed)
Date:  01/23/2020   Name:  Vanessa Zamora   DOB:  Feb 15, 1940   MRN:  [id number]   Chief Complaint: Diabetes (Needs A1C)  Diabetes She presents for her follow-up diabetic visit. She has type 2 diabetes mellitus. Her disease course has been stable. There are no hypoglycemic associated symptoms. Pertinent negatives for hypoglycemia include no dizziness, headaches or nervousness/anxiousness. Pertinent negatives for diabetes include no blurred vision, no chest pain, no fatigue, no foot paresthesias, no foot ulcerations, no polydipsia, no polyphagia, no polyuria, no visual change, no weakness and no weight loss. There are no hypoglycemic complications. Symptoms are stable. There are no diabetic complications. Risk factors for coronary artery disease include diabetes mellitus, dyslipidemia, hypertension and post-menopausal. Current diabetic treatment includes oral agent (monotherapy). She is compliant with treatment all of the time. Her weight is stable. She is following a generally healthy diet. Meal planning includes avoidance of concentrated sweets and carbohydrate counting. She participates in exercise daily. Her breakfast blood glucose is taken between 8-9 am. Her breakfast blood glucose range is generally 90-110 mg/dl. An ACE inhibitor/angiotensin II receptor blocker is being taken. She sees a podiatrist.Eye exam is current.    Lab Results  Component Value Date   CREATININE 0.72 09/13/2019   BUN 20 09/13/2019   NA 142 09/13/2019   K 3.9 09/13/2019   CL 103 09/13/2019   CO2 25 09/13/2019   Lab Results  Component Value Date   CHOL 201 (H) 09/13/2019   HDL 95 09/13/2019   LDLCALC 96 09/13/2019   TRIG 55 09/13/2019   CHOLHDL 2.2 01/26/2018   Lab Results  Component Value Date   TSH 1.840 09/13/2019   Lab Results  Component Value Date   HGBA1C 6.5 (H) 09/13/2019   Lab Results  Component Value Date   WBC 4.7 09/13/2019   HGB 11.7 09/13/2019   HCT 37.1 09/13/2019   MCV 75 (L)  09/13/2019   PLT 242 09/13/2019   Lab Results  Component Value Date   ALT 13 05/11/2019   AST 19 05/11/2019   ALKPHOS 44 05/11/2019   BILITOT 0.3 05/11/2019     Review of Systems  Constitutional: Negative.  Negative for chills, fatigue, fever, unexpected weight change and weight loss.  HENT: Negative for congestion, ear discharge, ear pain, rhinorrhea, sinus pressure, sneezing and sore throat.   Eyes: Negative for blurred vision, double vision, photophobia, pain, discharge, redness and itching.  Respiratory: Negative for cough, shortness of breath, wheezing and stridor.   Cardiovascular: Negative for chest pain.  Gastrointestinal: Negative for abdominal pain, blood in stool, constipation, diarrhea, nausea and vomiting.  Endocrine: Negative for cold intolerance, heat intolerance, polydipsia, polyphagia and polyuria.  Genitourinary: Negative for dysuria, flank pain, frequency, hematuria, menstrual problem, pelvic pain, urgency, vaginal bleeding and vaginal discharge.  Musculoskeletal: Negative for arthralgias, back pain and myalgias.  Skin: Negative for rash.  Allergic/Immunologic: Negative for environmental allergies and food allergies.  Neurological: Negative for dizziness, weakness, light-headedness, numbness and headaches.  Hematological: Negative for adenopathy. Does not bruise/bleed easily.  Psychiatric/Behavioral: Negative for dysphoric mood. The patient is not nervous/anxious.     Patient Active Problem List   Diagnosis Date Noted  . Myocardial infarction ([place]) 01/24/2018  . Coronary artery disease 01/24/2018  . Diabetes ([place]) 01/24/2018  . Heart murmur 01/24/2018  . Anemia 12/19/2015  . Osteoarthritis of left glenohumeral joint 06/20/2015  . Type 2 diabetes mellitus without complication, without long-term current use of insulin ([place]) 06/07/2014  . Hypercholesterolemia  06/07/2014  . Dyslipidemia 06/07/2014  . Hypertension 06/07/2014  . 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  Routine general medical examination at a health care facility 06/07/2014  . Screening for depression 06/07/2014    Allergies  Allergen Reactions  . Ace Inhibitors Swelling  . Altace  [Ramipril] Anaphylaxis  . Clonidine Swelling  . Losartan Potassium Other (See Comments)  . Levothyroxine Rash    Past Surgical History:  Procedure Laterality Date  . CARDIAC CATHETERIZATION  2005  . CATARACT EXTRACTION Left 10/2014  . CATARACT EXTRACTION W/PHACO Right 11/19/2014   Procedure: CATARACT EXTRACTION PHACO AND INTRAOCULAR LENS PLACEMENT ([place]);  Surgeon: Ronnell Freshwater, MD;  Location: [facility];  Service: Ophthalmology;  Laterality: Right;  DIABETIC - oral meds  . COLONOSCOPY WITH PROPOFOL N/A 06/30/2016   Procedure: COLONOSCOPY WITH PROPOFOL;  Surgeon: Lollie Sails, MD;  Location[facility] END[facility]COPY;  Service: Endoscopy;  Laterality: N/A;  . EYE SURGERY    . VAGINAL HYSTERECTOMY  02/10/1980    Social History   Tobacco Use  . Smoking status: Former Smoker    Packs/day: 0.25    Years: 30.00    Pack years: 7.50    Types: Cigarettes    Quit date: 2003    Years since quitting: 18.9  . Smokeless tobacco: Never Used  . Tobacco comment: quit 2003  Vaping Use  . Vaping Use: Never used  Substance Use Topics  . Alcohol use: No    Alcohol/week: 0.0 standard drinks  . Drug use: No     Medication list has been reviewed and updated.  No outpatient medications have been marked as taking for the 01/23/20 encounter (Office Visit) with Juline Patch, MD.    [facility] 2/9 Scores 01/23/2020 09/13/2019 05/11/2019 02/15/2019  PHQ - 2 Score 0 0 0 0  PHQ- 9 Score 0 1 0 -    GAD 7 : Generalized Anxiety Score 01/23/2020 09/13/2019 05/11/2019  Nervous, Anxious, on Edge 0 0 0  Control/stop worrying 0 0 0  Worry too much - different things 0 0 0  Trouble relaxing 0 0 0  Restless 0 0 0  Easily annoyed or irritable 0 0 0  Afraid - awful might happen 0 0 0  Total GAD 7 Score 0 0 0  Anxiety  Difficulty - Not difficult at all Not difficult at all    BP Readings from Last 3 Encounters:  01/23/20 130/80  09/13/19 136/72  05/11/19 126/70    Physical Exam Vitals and nursing note reviewed.  Constitutional:      General: She is not in acute distress.    Appearance: She is not diaphoretic.  HENT:     Head: Normocephalic and atraumatic.     Right Ear: Tympanic membrane, ear canal and external ear normal. There is no impacted cerumen.     Left Ear: Tympanic membrane, ear canal and external ear normal. There is no impacted cerumen.     Nose: Nose normal.     Mouth/Throat:     Mouth: Oropharynx is clear and moist.  Eyes:     General:        Right eye: No discharge.        Left eye: No discharge.     Extraocular Movements: EOM normal.     Conjunctiva/sclera: Conjunctivae normal.     Pupils: Pupils are equal, round, and reactive to light.  Neck:     Thyroid: No thyromegaly.     Vascular: No JVD.  Cardiovascular:  Rate and Rhythm: Normal rate and regular rhythm.     Pulses: Intact distal pulses.     Heart sounds: Normal heart sounds. No murmur heard. No friction rub. No gallop.   Pulmonary:     Effort: Pulmonary effort is normal.     Breath sounds: Normal breath sounds.  Chest:  Breasts:     Right: Normal. No swelling, bleeding, inverted nipple, mass, nipple discharge, skin change, tenderness, axillary adenopathy or supraclavicular adenopathy.     Left: Normal. No swelling, bleeding, inverted nipple, mass, nipple discharge, skin change, tenderness, axillary adenopathy or supraclavicular adenopathy.    Abdominal:     General: Bowel sounds are normal.     Palpations: Abdomen is soft. There is no mass.     Tenderness: There is no abdominal tenderness. There is no guarding.  Musculoskeletal:        General: No edema. Normal range of motion.     Cervical back: Normal range of motion and neck supple.  Lymphadenopathy:     Cervical: No cervical adenopathy.     Upper  Body:     Right upper body: No supraclavicular or axillary adenopathy.     Left upper body: No supraclavicular or axillary adenopathy.  Skin:    General: Skin is warm and dry.  Neurological:     Mental Status: She is alert.     Deep Tendon Reflexes: Reflexes are normal and symmetric.     Wt Readings from Last 3 Encounters:  01/23/20 125 lb (56.7 kg)  09/13/19 127 lb (57.6 kg)  05/11/19 126 lb (57.2 kg)    BP 130/80   Pulse 64   Ht 5\' 5"  (1.651 m)   Wt 125 lb (56.7 kg)   BMI 20.80 kg/m   Assessment and Plan: 1. Type 2 diabetes mellitus without complication, without long-term current use of insulin (HCC) Chronic.  Controlled.  Stable.  We will continue Metformin 500 mg twice a day.  Will repeat A1c for current status. - HgB A1c - metFORMIN (GLUCOPHAGE) 500 MG tablet; TAKE 1 TABLET BY MOUTH TWICE DAILY  Dispense: 180 tablet; Refill: 1  2. Encounter for breast cancer screening using non-mammogram modality Chronic.  Controlled.  Stable.  Patient has upcoming evaluation for breast cancer with mammogram.  Bilateral breast examination notes no palpable masses and no abnormalities.  3. Type 2 diabetes mellitus without complication, without long-term current use of insulin (HCC) As noted above - HgB A1c - metFORMIN (GLUCOPHAGE) 500 MG tablet; TAKE 1 TABLET BY MOUTH TWICE DAILY  Dispense: 180 tablet; Refill: 1

## 2020-01-24 LAB — HEMOGLOBIN A1C
Est. average glucose Bld gHb Est-mCnc: 140 mg/dL
Hgb A1c MFr Bld: 6.5 % — ABNORMAL HIGH (ref 4.8–5.6)

## 2020-02-04 ENCOUNTER — Other Ambulatory Visit: Payer: Self-pay | Admitting: Family Medicine

## 2020-02-04 DIAGNOSIS — E119 Type 2 diabetes mellitus without complications: Secondary | ICD-10-CM

## 2020-02-04 DIAGNOSIS — I10 Essential (primary) hypertension: Secondary | ICD-10-CM

## 2020-02-04 NOTE — Telephone Encounter (Signed)
Requested Prescriptions  Pending Prescriptions Disp Refills  . olmesartan (BENICAR) 5 MG tablet [Pharmacy Med Name: OLMESARTAN MEDOXOMIL 5MG  TABLETS] 90 tablet 0    Sig: TAKE 1 TABLET(5 MG) BY MOUTH DAILY     Cardiovascular:  Angiotensin Receptor Blockers Passed - 02/04/2020  3:36 AM      Passed - Cr in normal range and within 180 days    Creatinine, Ser  Date Value Ref Range Status  09/13/2019 0.72 0.57 - 1.00 mg/dL Final         Passed - K in normal range and within 180 days    Potassium  Date Value Ref Range Status  09/13/2019 3.9 3.5 - 5.2 mmol/L Final         Passed - Patient is not pregnant      Passed - Last BP in normal range    BP Readings from Last 1 Encounters:  01/23/20 130/80         Passed - Valid encounter within last 6 months    Recent Outpatient Visits          1 week ago Type 2 diabetes mellitus without complication, without long-term current use of insulin ([place])   [facility] Juline Patch, MD   4 months ago Type 2 diabetes mellitus without complication, without long-term current use of insulin ([place])   [facility] Juline Patch, MD   8 months ago Diabetes mellitus without complication [facility])   [facility] Juline [facility], MD   1 year ago Annual physical exam   [facility] Juline Patch, MD   1 year ago Dyslipidemia   [place], Deanna C, MD      Future Appointments            In 3 months Juline Patch, MD [facility], [facility]

## 2020-02-05 ENCOUNTER — Other Ambulatory Visit: Payer: Self-pay

## 2020-02-05 ENCOUNTER — Telehealth: Payer: Self-pay

## 2020-02-05 DIAGNOSIS — I10 Essential (primary) hypertension: Secondary | ICD-10-CM

## 2020-02-05 DIAGNOSIS — E7800 Pure hypercholesterolemia, unspecified: Secondary | ICD-10-CM

## 2020-02-05 DIAGNOSIS — E785 Hyperlipidemia, unspecified: Secondary | ICD-10-CM

## 2020-02-05 MED ORDER — PRAVASTATIN SODIUM 40 MG PO TABS: 40.0000 mg | ORAL_TABLET | Freq: Every day | ORAL | 0 refills | Status: DC

## 2020-02-05 MED ORDER — CARVEDILOL 6.25 MG PO TABS: 6.2500 mg | ORAL_TABLET | Freq: Two times a day (BID) | ORAL | 0 refills | Status: DC

## 2020-02-05 MED ORDER — HYDROCHLOROTHIAZIDE 25 MG PO TABS: 25.0000 mg | ORAL_TABLET | Freq: Every day | ORAL | 0 refills | Status: DC

## 2020-02-05 NOTE — Telephone Encounter (Signed)
Pt having issues with refills, would like a call back from [place]

## 2020-02-14 ENCOUNTER — Other Ambulatory Visit: Payer: Self-pay

## 2020-02-14 ENCOUNTER — Ambulatory Visit
Admission: RE | Admit: 2020-02-14 | Discharge: 2020-02-14 | Disposition: A | Discharge status: Home / Self Care | Payer: Medicare HMO | Source: Ambulatory Visit | Attending: Family Medicine | Admitting: Family Medicine

## 2020-02-14 DIAGNOSIS — Z1231 Encounter for screening mammogram for malignant neoplasm of breast: Secondary | ICD-10-CM | POA: Diagnosis not present

## 2020-02-19 ENCOUNTER — Other Ambulatory Visit: Payer: Self-pay

## 2020-02-19 ENCOUNTER — Ambulatory Visit (INDEPENDENT_AMBULATORY_CARE_PROVIDER_SITE_OTHER): Payer: Medicare HMO

## 2020-02-19 VITALS — BP 112/80 | HR 76 | Temp 97.8°F | Resp 16 | Ht 65.0 in | Wt 123.0 lb

## 2020-02-19 DIAGNOSIS — Z0000 Encounter for general adult medical examination without abnormal findings: Secondary | ICD-10-CM

## 2020-02-19 NOTE — Patient Instructions (Signed)
[name] , Thank you for taking time to come for your Medicare Wellness Visit. I appreciate your ongoing commitment to your health goals. Please review the following plan we discussed and let me know if I can assist you in the future.   Screening recommendations/referrals: Colonoscopy: done 06/30/16 Mammogram: done 02/14/20 Bone Density: done 03/16/18 Recommended yearly ophthalmology/optometry visit for glaucoma screening and checkup Recommended yearly dental visit for hygiene and checkup  Vaccinations: Influenza vaccine: done 11/14/19 Pneumococcal vaccine: done 12/18/14 Tdap vaccine: done 01/28/16 Shingles vaccine: done 12/10/17 & 02/17/18   Covid-19: done 03/02/19, 03/23/19 & 11/30/19  Conditions/risks identified: Recommend drinking 6-8 glasses of water per day   Next appointment: Follow up in one year for your annual wellness visit    Preventive Care 3 Years and Older, Female Preventive care refers to lifestyle choices and visits with your health care provider that can promote health and wellness. What does preventive care include?  A yearly physical exam. This is also called an annual well check.  Dental exams once or twice a year.  Routine eye exams. Ask your health care provider how often you should have your eyes checked.  Personal lifestyle choices, including:  Daily care of your teeth and gums.  Regular physical activity.  Eating a healthy diet.  Avoiding tobacco and drug use.  Limiting alcohol use.  Practicing safe sex.  Taking low-dose aspirin every day.  Taking vitamin and mineral supplements as recommended by your health care provider. What happens during an annual well check? The services and screenings done by your health care provider during your annual well check will depend on your age, overall health, lifestyle risk factors, and family history of disease. Counseling  Your health care provider may ask you questions about your:  Alcohol use.  Tobacco  use.  Drug use.  Emotional well-being.  Home and relationship well-being.  Sexual activity.  Eating habits.  History of falls.  Memory and ability to understand (cognition).  Work and work Statistician.  Reproductive health. Screening  You may have the following tests or measurements:  Height, weight, and BMI.  Blood pressure.  Lipid and cholesterol levels. These may be checked every 5 years, or more frequently if you are over 36 years old.  Skin check.  Lung cancer screening. You may have this screening every year starting at age 25 if you have a 30-pack-year history of smoking and currently smoke or have quit within the past 15 years.  Fecal occult blood test (FOBT) of the stool. You may have this test every year starting at age 28.  Flexible sigmoidoscopy or colonoscopy. You may have a sigmoidoscopy every 5 years or a colonoscopy every 10 years starting at age 58.  Hepatitis C blood test.  Hepatitis B blood test.  Sexually transmitted disease (STD) testing.  Diabetes screening. This is done by checking your blood sugar (glucose) after you have not eaten for a while (fasting). You may have this done every 1-3 years.  Bone density scan. This is done to screen for osteoporosis. You may have this done starting at age 48.  Mammogram. This may be done every 1-2 years. Talk to your health care provider about how often you should have regular mammograms. Talk with your health care provider about your test results, treatment options, and if necessary, the need for more tests. Vaccines  Your health care provider may recommend certain vaccines, such as:  Influenza vaccine. This is recommended every year.  Tetanus, diphtheria, and acellular pertussis (  Tdap, Td) vaccine. You may need a Td booster every 10 years.  Zoster vaccine. You may need this after age 68.  Pneumococcal 13-valent conjugate (PCV13) vaccine. One dose is recommended after age 13.  Pneumococcal  polysaccharide (PPSV23) vaccine. One dose is recommended after age 44. Talk to your health care provider about which screenings and vaccines you need and how often you need them. This information is not intended to replace advice given to you by your health care provider. Make sure you discuss any questions you have with your health care provider. Document Released: 02/22/2015 Document Revised: 10/16/2015 Document Reviewed: 11/27/2014 Elsevier Interactive Patient Education  2017 [employer] Prevention in the Home Falls can cause injuries. They can happen to people of all ages. There are many things you can do to make your home safe and to help prevent falls. What can I do on the outside of my home?  Regularly fix the edges of walkways and driveways and fix any cracks.  Remove anything that might make you trip as you walk through a door, such as a raised step or threshold.  Trim any bushes or trees on the path to your home.  Use bright outdoor lighting.  Clear any walking paths of anything that might make someone trip, such as rocks or tools.  Regularly check to see if handrails are loose or broken. Make sure that both sides of any steps have handrails.  Any raised decks and porches should have guardrails on the edges.  Have any leaves, snow, or ice cleared regularly.  Use sand or salt on walking paths during winter.  Clean up any spills in your garage right away. This includes oil or grease spills. What can I do in the bathroom?  Use night lights.  Install grab bars by the toilet and in the tub and shower. Do not use towel bars as grab bars.  Use non-skid mats or decals in the tub or shower.  If you need to sit down in the shower, use a plastic, non-slip stool.  Keep the floor dry. Clean up any water that spills on the floor as soon as it happens.  Remove soap buildup in the tub or shower regularly.  Attach bath mats securely with double-sided non-slip rug  tape.  Do not have throw rugs and other things on the floor that can make you trip. What can I do in the bedroom?  Use night lights.  Make sure that you have a light by your bed that is easy to reach.  Do not use any sheets or blankets that are too big for your bed. They should not hang down onto the floor.  Have a firm chair that has side arms. You can use this for support while you get dressed.  Do not have throw rugs and other things on the floor that can make you trip. What can I do in the kitchen?  Clean up any spills right away.  Avoid walking on wet floors.  Keep items that you use a lot in easy-to-reach places.  If you need to reach something above you, use a strong step stool that has a grab bar.  Keep electrical cords out of the way.  Do not use floor polish or wax that makes floors slippery. If you must use wax, use non-skid floor wax.  Do not have throw rugs and other things on the floor that can make you trip. What can I do with my stairs?  Do  not leave any items on the stairs.  Make sure that there are handrails on both sides of the stairs and use them. Fix handrails that are broken or loose. Make sure that handrails are as long as the stairways.  Check any carpeting to make sure that it is firmly attached to the stairs. Fix any carpet that is loose or worn.  Avoid having throw rugs at the top or bottom of the stairs. If you do have throw rugs, attach them to the floor with carpet tape.  Make sure that you have a light switch at the top of the stairs and the bottom of the stairs. If you do not have them, ask someone to add them for you. What else can I do to help prevent falls?  Wear shoes that:  Do not have high heels.  Have rubber bottoms.  Are comfortable and fit you well.  Are closed at the toe. Do not wear sandals.  If you use a stepladder:  Make sure that it is fully opened. Do not climb a closed stepladder.  Make sure that both sides of the  stepladder are locked into place.  Ask someone to hold it for you, if possible.  Clearly mark and make sure that you can see:  Any grab bars or handrails.  First and last steps.  Where the edge of each step is.  Use tools that help you move around (mobility aids) if they are needed. These include:  Canes.  Walkers.  Scooters.  Crutches.  Turn on the lights when you go into a dark area. Replace any light bulbs as soon as they burn out.  Set up your furniture so you have a clear path. Avoid moving your furniture around.  If any of your floors are uneven, fix them.  If there are any pets around you, be aware of where they are.  Review your medicines with your doctor. Some medicines can make you feel dizzy. This can increase your chance of falling. Ask your doctor what other things that you can do to help prevent falls. This information is not intended to replace advice given to you by your health care provider. Make sure you discuss any questions you have with your health care provider. Document Released: 11/22/2008 Document Revised: 07/04/2015 Document Reviewed: 03/02/2014 Elsevier Interactive Patient Education  2017 [employer].

## 2020-02-19 NOTE — Progress Notes (Signed)
Subjective:   Vanessa Zamora is a 80 y.o. female who presents for Medicare Annual (Subsequent) preventive examination.  Review of Systems     Cardiac Risk Factors include: advanced age (>89men, >31 women);diabetes mellitus;dyslipidemia;hypertension     Objective:    Today's Vitals   02/19/20 0857  BP: 112/80  Pulse: 76  Resp: 16  Temp: 97.8 F (36.6 C)  TempSrc: Oral  SpO2: 99%  Weight: 123 lb (55.8 kg)  Height: 5\' 5"  (1.651 m)   Body mass index is 20.47 kg/m.  Advanced Directives 02/19/2020 02/15/2019 01/24/2018 01/21/2017 06/30/2016 12/06/2014 11/19/2014  Does Patient Have a Medical Advance Directive? Yes Yes No Yes Yes Yes No  Type of Paramedic of [place];Living will [place];Living will - [place];Living will Living will [place] -  Does patient want to make changes to medical advance directive? - - Yes (MAU/Ambulatory/Procedural Areas - Information given) - - - -  Copy of [place] in Chart? Yes - validated most recent copy scanned in chart (See row information) No - copy requested - No - copy requested - No - copy requested -  Would patient like information on creating a medical advance directive? - - - - - - Yes - Educational materials given    Current Medications (verified) Outpatient Encounter Medications as of 02/19/2020  Medication Sig  . aspirin 81 MG tablet Take 1 tablet (81 mg total) by mouth daily.  . Calcium Carbonate-Vit D-Min (CALTRATE 600+D PLUS MINERALS) 600-800 MG-UNIT TABS Take 1 tablet by mouth daily.  . carvedilol (COREG) 6.25 MG tablet Take 1 tablet (6.25 mg total) by mouth 2 (two) times daily.  Marland Kitchen docusate sodium (COLACE) 100 MG capsule Take 100 mg by mouth daily as needed for mild constipation.  . FEROSUL 325 (65 Fe) MG tablet TAKE 1 TABLET BY MOUTH EVERY OTHER DAY  . hydrochlorothiazide (HYDRODIURIL) 25 MG tablet Take 1 tablet (25 mg total) by  mouth daily.  . metFORMIN (GLUCOPHAGE) 500 MG tablet TAKE 1 TABLET BY MOUTH TWICE DAILY  . Multiple Vitamins-Minerals (CENTRUM SILVER ULTRA WOMENS) TABS Take 1 tablet by mouth daily.  Marland Kitchen olmesartan (BENICAR) 5 MG tablet TAKE 1 TABLET(5 MG) BY MOUTH DAILY  . Omega-3 Fatty Acids (FISH OIL) 1000 MG CAPS Take by mouth.  Glory Rosebush ULTRA test strip TEST ONCE DAILY  . pravastatin (PRAVACHOL) 40 MG tablet Take 1 tablet (40 mg total) by mouth daily.  Marland Kitchen SYNTHROID 25 MCG tablet Take 1 tablet by mouth daily. Dr Truddie Coco (Patient not taking: Reported on 02/19/2020)   No facility-administered encounter medications on file as of 02/19/2020.    Allergies (verified) Ace inhibitors, Altace  [ramipril], Clonidine, Losartan potassium, and Levothyroxine   History: Past Medical History:  Diagnosis Date  . Arthritis    shoulders, knees right side  . Coronary artery disease   . Diabetes mellitus without complication ([place])   . Hypercholesteremia   . Hypertension   . Myocardial infarction () 2005  . Seasonal allergies   . Thyroid goiter   . Wears dentures    partial lower   Past Surgical History:  Procedure Laterality Date  . CARDIAC CATHETERIZATION  2005  . CATARACT EXTRACTION Left 10/2014  . CATARACT EXTRACTION W/PHACO Right 11/19/2014   Procedure: CATARACT EXTRACTION PHACO AND INTRAOCULAR LENS PLACEMENT ([place]);  Surgeon: Ronnell Freshwater, MD;  Location: [facility];  Service: Ophthalmology;  Laterality: Right;  DIABETIC - oral meds  .  COLONOSCOPY WITH PROPOFOL N/A 06/30/2016   Procedure: COLONOSCOPY WITH PROPOFOL;  Surgeon: Lollie Sails, MD;  [facility] ENDOSCOPY;  Service: Endoscopy;  Laterality: N/A;  . EYE SURGERY    . VAGINAL HYSTERECTOMY  02/10/1980   Family History  Problem Relation Age of Onset  . Stroke Mother   . Aneurysm Sister   . Heart disease Brother   . Hypertension Brother   . Goiter Maternal Grandmother   . Diabetes Maternal Grandmother   . COPD  Brother   . Hypertension Brother   . Breast cancer Neg Hx    Social History   Socioeconomic History  . Marital status: Single    Spouse name: Not on file  . Number of children: 1  . Years of education: Not on file  . Highest education level: Not on file  Occupational History  . Occupation: Retired  Tobacco Use  . Smoking status: Former Smoker    Packs/day: 0.25    Years: 30.00    Pack years: 7.50    Types: Cigarettes    Quit date: 2003    Years since quitting: 19.0  . Smokeless tobacco: Never Used  . Tobacco comment: quit 2003  Vaping Use  . Vaping Use: Never used  Substance and Sexual Activity  . Alcohol use: No    Alcohol/week: 0.0 standard drinks  . Drug use: No  . Sexual activity: Not Currently  Other Topics Concern  . Not on file  Social History Narrative   Pt lives alone   Social Determinants of Health   Financial Resource Strain: Low Risk   . Difficulty of Paying Living Expenses: Not hard at all  Food Insecurity: No Food Insecurity  . Worried About Charity fundraiser in the Last Year: Never true  . Ran Out of Food in the Last Year: Never true  Transportation Needs: No Transportation Needs  . Lack of Transportation (Medical): No  . Lack of Transportation (Non-Medical): No  Physical Activity: Sufficiently Active  . Days of Exercise per Week: 5 days  . Minutes of Exercise per Session: 60 min  Stress: No Stress Concern Present  . Feeling of Stress : Only a little  Social Connections: Moderately Integrated  . Frequency of Communication with Friends and Family: More than three times a week  . Frequency of Social Gatherings with Friends and Family: Once a week  . Attends Religious Services: More than 4 times per year  . Active Member of Clubs or Organizations: Yes  . Attends Archivist Meetings: More than 4 times per year  . Marital Status: Divorced    Tobacco Counseling Counseling given: Not Answered Comment: quit 2003   Clinical  Intake:  Pre-visit preparation completed: Yes  Pain : No/denies pain     BMI - recorded: 20.47 Nutritional Status: BMI of 19-24  Normal Nutritional Risks: None Diabetes: Yes CBG done?: No Did pt. bring in CBG monitor from home?: No  How often do you need to have someone help you when you read instructions, pamphlets, or other written materials from your doctor or pharmacy?: 1 - Never  Nutrition Risk Assessment:  Has the patient had any N/V/D within the last 2 months?  No  Does the patient have any non-healing wounds?  No  Has the patient had any unintentional weight loss or weight gain?  Yes - 2 pounds in the last month  Diabetes:  Is the patient diabetic?  Yes  If diabetic, was a CBG obtained today?  No  Did the patient bring in their glucometer from home?  No  How often do you monitor your CBG's? daily.   Financial Strains and Diabetes Management:  Are you having any financial strains with the device, your supplies or your medication? No .  Does the patient want to be seen by Chronic Care Management for management of their diabetes?  No  Would the patient like to be referred to a Nutritionist or for Diabetic Management?  No   Diabetic Exams:  Diabetic Eye Exam: Completed 11/17/19 [facility].   Diabetic Foot Exam: Completed 02/12/20 Dr. Vickki Muff.    Interpreter Needed?: No  Information entered by :: Clemetine Marker LPN   Activities of Daily Living In your present state of health, do you have any difficulty performing the following activities: 02/19/2020  Hearing? N  Comment declines hearing aids  Vision? N  Difficulty concentrating or making decisions? N  Walking or climbing stairs? N  Dressing or bathing? N  Doing errands, shopping? N  Preparing Food and eating ? N  Using the Toilet? N  In the past six months, have you accidently leaked urine? N  Do you have problems with loss of bowel control? N  Managing your Medications? N  Managing your Finances? N   Housekeeping or managing your Housekeeping? N  Some recent data might be hidden    Patient Care Team: Juline Patch, MD as PCP - General (Family Medicine) Ronnald Collum, Lourdes Sledge, MD as Consulting Physician (Endocrinology) Yolonda Kida, MD as Consulting Physician (Cardiology) Samara Deist, DPM as Referring Physician (Podiatry) Eulogio Bear, MD as Consulting Physician (Ophthalmology)  Indicate any recent Medical Services you may have received from other than [facility] providers in the past year (date may be approximate).     Assessment:   This is a routine wellness examination for [place].  Hearing/Vision screen  Hearing Screening   125Hz  250Hz  500Hz  1000Hz  2000Hz  3000Hz  4000Hz  6000Hz  8000Hz   Right ear:           Left ear:           Comments: Pt denies hearing difficulty  Vision Screening Comments: Annual vision screenings at [place] issues and exercise activities discussed: Current Exercise Habits: Home exercise routine, Type of exercise: walking, Time (Minutes): 60, Frequency (Times/Week): 5, Weekly Exercise (Minutes/Week): 300, Intensity: Mild, Exercise limited by: None identified  Goals    . DIET - INCREASE WATER INTAKE     Recommend 6-8 glasses of water per day    . Prevent falls     Recommend to remove items from the home that may cause slips or trips      Depression Screen PHQ 2/9 Scores 02/19/2020 01/23/2020 09/13/2019 05/11/2019 02/15/2019 01/10/2019 05/19/2018  PHQ - 2 Score 0 0 0 0 0 0 0  PHQ- 9 Score - 0 1 0 - 0 0    Fall Risk Fall Risk  02/19/2020 01/23/2020 09/13/2019 05/11/2019 02/15/2019  Falls in the past year? 0 0 0 0 1  Number falls in past yr: 0 - - - 1  Comment - - - - mechanical fall  Injury with Fall? 0 - - - 0  Risk for fall due to : No Fall Risks - No Fall Risks - History of fall(s)  Follow up Falls prevention discussed Falls evaluation completed Falls evaluation completed - Falls prevention discussed    FALL RISK PREVENTION  PERTAINING TO THE HOME:  Any stairs in or around the  home? No  If so, are there any without handrails? No  Home free of loose throw rugs in walkways, pet beds, electrical cords, etc? Yes  Adequate lighting in your home to reduce risk of falls? Yes   ASSISTIVE DEVICES UTILIZED TO PREVENT FALLS:  Life alert? No  Use of a cane, walker or w/c? No  Grab bars in the bathroom? Yes  Shower chair or bench in shower? No  Elevated toilet seat or a handicapped toilet? Yes   TIMED UP AND GO:  Was the test performed? Yes .  Length of time to ambulate 10 feet: 4 sec.   Gait steady and fast without use of assistive device  Cognitive Function:     6CIT Screen 02/19/2020 02/15/2019 01/24/2018 01/21/2017  What Year? 0 points 0 points 0 points 0 points  What month? 0 points 0 points 0 points 0 points  What time? 0 points 0 points 0 points 0 points  Count back from 20 0 points 0 points 0 points 0 points  Months in reverse 0 points 0 points 0 points 0 points  Repeat phrase 0 points 0 points 0 points 0 points  Total Score 0 0 0 0    Immunizations Immunization History  Administered Date(s) Administered  . Fluad Quad(high Dose 65+) 11/09/2018, 11/14/2019  . Influenza, High Dose Seasonal PF 11/24/2016, 11/12/2017  . Influenza, Seasonal, Injecte, Preservative Fre 11/13/2010, 11/18/2011  . Influenza,inj,Quad PF,6+ Mos 11/16/2012, 11/13/2013, 12/06/2014, 11/12/2015  . Influenza-Unspecified 11/13/2013  . PFIZER SARS-COV-2 Vaccination 03/02/2019, 03/23/2019, 11/30/2019  . Pneumococcal Conjugate-13 12/18/2014  . Pneumococcal Polysaccharide-23 05/08/2010, 05/13/2012  . Td 09/26/1987  . Tdap 01/28/2016  . Zoster 01/09/2013  . Zoster Recombinat (Shingrix) 12/10/2017, 02/17/2018    TDAP status: Up to date  Flu Vaccine status: Up to date  Pneumococcal vaccine status: Up to date  Covid-19 vaccine status: Completed vaccines  Qualifies for Shingles Vaccine? Yes   Zostavax completed Yes   Shingrix  Completed?: Yes  Screening Tests Health Maintenance  Topic Date Due  . COVID-19 Vaccine (4 - Booster for Pfizer series) 05/30/2020  . HEMOGLOBIN A1C  07/23/2020  . OPHTHALMOLOGY EXAM  11/16/2020  . FOOT EXAM  02/11/2021  . MAMMOGRAM  02/13/2021  . TETANUS/TDAP  01/27/2026  . INFLUENZA VACCINE  Completed  . DEXA SCAN  Completed  . PNA vac Low Risk Adult  Completed    Health Maintenance  There are no preventive care reminders to display for this patient.  Colorectal cancer screening: Type of screening: Colonoscopy. Completed 06/30/16. Repeat every 5 years or as directed by provider; may no longer be required.   Mammogram status: Completed 02/14/20. Repeat every year  Bone Density status: Completed 03/16/18. Results reflect: Bone density results: NORMAL. Repeat every 2 years. or as directed by provider.   Lung Cancer Screening: (Low Dose CT Chest recommended if Age 17-80 years, 30 pack-year currently smoking OR have quit w/in 15years.) does not qualify.   Additional Screening:  Hepatitis C Screening: does not qualify.  Vision Screening: Recommended annual ophthalmology exams for early detection of glaucoma and other disorders of the eye. Is the patient up to date with their annual eye exam?  Yes  Who is the provider or what is the name of the office in which the patient attends annual eye exams? [place] Screening: Recommended annual dental exams for proper oral hygiene  Community Resource Referral / Chronic Care Management: CRR required this visit?  No   CCM required this  visit?  No      Plan:     I have personally reviewed and noted the following in the patient's chart:   . Medical and social history . Use of alcohol, tobacco or illicit drugs  . Current medications and supplements . Functional ability and status . Nutritional status . Physical activity . Advanced directives . List of other physicians . Hospitalizations, surgeries, and ER visits  in previous 12 months . Vitals . Screenings to include cognitive, depression, and falls . Referrals and appointments  In addition, I have reviewed and discussed with patient certain preventive protocols, quality metrics, and best practice recommendations. A written personalized care plan for preventive services as well as general preventive health recommendations were provided to patient.     Clemetine Marker, LPN   [id number]   Nurse Notes: pt doing well; concerned about weight loss; has lost 2 more pounds since visit on 01/23/20. She reports eating 2-3 healthy meals per day with snacks between. Pt was recommended to d/c synthroid until April per endo. Advised BMI still WNL and encouraged adequate calories and protein.

## 2020-04-15 ENCOUNTER — Telehealth: Payer: Self-pay

## 2020-04-15 NOTE — Telephone Encounter (Signed)
Copied from [place] [id number]. Topic: General - Inquiry >> Apr 15, 2020 10:23 AM Greggory Keen D wrote: Reason for CRM: Pt called regarding a test result and only wants to speak to Dr. Ronnald Ramp nurse Baxter Flattery.  CB#  [phone]

## 2020-04-15 NOTE — Telephone Encounter (Signed)
Spoke to pt

## 2020-05-02 ENCOUNTER — Other Ambulatory Visit: Payer: Self-pay | Admitting: Family Medicine

## 2020-05-02 DIAGNOSIS — E7800 Pure hypercholesterolemia, unspecified: Secondary | ICD-10-CM

## 2020-05-02 DIAGNOSIS — E785 Hyperlipidemia, unspecified: Secondary | ICD-10-CM

## 2020-05-02 DIAGNOSIS — I10 Essential (primary) hypertension: Secondary | ICD-10-CM

## 2020-05-02 DIAGNOSIS — E119 Type 2 diabetes mellitus without complications: Secondary | ICD-10-CM

## 2020-05-02 NOTE — Telephone Encounter (Signed)
Requested Prescriptions  Pending Prescriptions Disp Refills  . pravastatin (PRAVACHOL) 40 MG tablet [Pharmacy Med Name: PRAVASTATIN 40MG  TABLETS] 90 tablet 2    Sig: TAKE 1 TABLET(40 MG) BY MOUTH DAILY     Cardiovascular:  Antilipid - Statins Failed - 05/02/2020  3:34 AM      Failed - Total Cholesterol in normal range and within 360 days    Cholesterol, Total  Date Value Ref Range Status  09/13/2019 201 (H) 100 - 199 mg/dL Final         Failed - LDL in normal range and within 360 days    LDL Chol Calc (NIH)  Date Value Ref Range Status  09/13/2019 96 0 - 99 mg/dL Final         Passed - HDL in normal range and within 360 days    HDL  Date Value Ref Range Status  09/13/2019 95 >39 mg/dL Final         Passed - Triglycerides in normal range and within 360 days    Triglycerides  Date Value Ref Range Status  09/13/2019 55 0 - 149 mg/dL Final         Passed - Patient is not pregnant      Passed - Valid encounter within last 12 months    Recent Outpatient Visits          3 months ago Type 2 diabetes mellitus without complication, without long-term current use of insulin ([place])   [facility]ine Patch, MD   7 months ago Type 2 diabetes mellitus without complication, without long-term current use of insulin ([place])   [facility], MD   11 months ago Diabetes mellitus without complication ([place])   [facility]ine [facility], MD   1 year ago Annual physical exam   [place], Deanna C, MD   1 year ago Dyslipidemia   [place], MD      Future Appointments            In 2 weeks Juline Patch, MD [facility]           . hydrochlorothiazide (HYDRODIURIL) 25 MG tablet [Pharmacy Med Name: HYDROCHLOROTHIAZIDE 25MG  TABLETS] 90 tablet 0    Sig: TAKE 1 TABLET(25 MG) BY MOUTH DAILY     Cardiovascular: Diuretics - Thiazide Passed - 05/02/2020  3:34 AM      Passed - Ca in  normal range and within 360 days    Calcium  Date Value Ref Range Status  09/13/2019 9.4 8.7 - 10.3 mg/dL Final         Passed - Cr in normal range and within 360 days    Creatinine, Ser  Date Value Ref Range Status  09/13/2019 0.72 0.57 - 1.00 mg/dL Final         Passed - K in normal range and within 360 days    Potassium  Date Value Ref Range Status  09/13/2019 3.9 3.5 - 5.2 mmol/L Final         Passed - Na in normal range and within 360 days    Sodium  Date Value Ref Range Status  09/13/2019 142 134 - 144 mmol/L Final         Passed - Last BP in normal range    BP Readings from Last 1 Encounters:  02/19/20 112/80         Passed - Valid encounter  within last 6 months    Recent Outpatient Visits          3 months ago Type 2 diabetes mellitus without complication, without long-term current use of insulin ([place])   [facility], [facility]   7 months ago Type 2 diabetes mellitus without complication, without long-term current use of insulin ([place])   [facility] Juline [facility], MD   11 months ago Diabetes mellitus without complication [facility])   [facility] Juline [facility], MD   1 year ago Annual physical exam   [facility] Juline [facility], MD   1 year ago Dyslipidemia   [place], Deanna C, MD      Future Appointments            In 2 weeks Juline Patch, MD [facility] [place]           . olmesartan (BENICAR) 5 MG tablet [Pharmacy Med Name: OLMESARTAN MEDOXOMIL 5MG  TABLETS] 90 tablet 0    Sig: TAKE 1 TABLET(5 MG) BY MOUTH DAILY     Cardiovascular:  Angiotensin Receptor Blockers Failed - 05/02/2020  3:34 AM      Failed - Cr in normal range and within 180 days    Creatinine, Ser  Date Value Ref Range Status  09/13/2019 0.72 0.57 - 1.00 mg/dL Final         Failed - K in normal range and within 180 days    Potassium  Date Value Ref Range Status  09/13/2019 3.9 3.5 - 5.2 mmol/L Final          Passed - Patient is not pregnant      Passed - Last BP in normal range    BP Readings from Last 1 Encounters:  02/19/20 112/80         Passed - Valid encounter within last 6 months    Recent Outpatient Visits          3 months ago Type 2 diabetes mellitus without complication, without long-term current use of insulin ([place])   [facility], MD   7 months ago Type 2 diabetes mellitus without complication, without long-term current use of insulin ([place])   [facility] Juline Patch, MD   11 months ago Diabetes mellitus without complication [facility])   [facility] Jul[facility], MD   1 year ago Annual physical exam   [facility] Juline Patch, MD   1 year ago Dyslipidemia   [place], Deanna C, MD      Future Appointments            In 2 weeks Juline Patch, MD [facility]           . carvedilol (COREG) 6.25 MG tablet [Pharmacy Med Name: CARVEDILOL 6.25MG  TABLETS] 180 tablet 0    Sig: TAKE 1 TABLET(6.25 MG) BY MOUTH TWICE DAILY     Cardiovascular:  Beta Blockers Passed - 05/02/2020  3:34 AM      Passed - Last BP in normal range    BP Readings from Last 1 Encounters:  02/19/20 112/80         Passed - Last Heart Rate in normal range    Pulse Readings from Last 1 Encounters:  02/19/20 76         Passed - Valid encounter within last 6 months    Recent Outpatient Visits  3 months ago Type 2 diabetes mellitus without complication, without long-term current use of insulin ([place])   [facility], MD   7 months ago Type 2 diabetes mellitus without complication, without long-term current use of insulin ([place])   [facility] Juline Patch, MD   11 months ago Diabetes mellitus without complication [facility])   [facility] Jul[facility], MD   1 year ago Annual physical exam   [facility] Jul[facility], MD   1 year ago  Dyslipidemia   [place], Deanna C, MD      Future Appointments            In 2 weeks Juline Patch, MD [facility], [facility]

## 2020-05-03 ENCOUNTER — Other Ambulatory Visit: Payer: Self-pay | Admitting: Family Medicine

## 2020-05-03 DIAGNOSIS — E119 Type 2 diabetes mellitus without complications: Secondary | ICD-10-CM

## 2020-05-12 ENCOUNTER — Other Ambulatory Visit: Payer: Self-pay | Admitting: Family Medicine

## 2020-05-12 DIAGNOSIS — D509 Iron deficiency anemia, unspecified: Secondary | ICD-10-CM

## 2020-05-12 NOTE — Telephone Encounter (Signed)
Requested medication (s) are due for refill today: yes  Requested medication (s) are on the active medication list: yes  Last refill:  11/13/19  Future visit scheduled: yes  Notes to clinic:  overdue lab work   Requested Prescriptions  Pending Prescriptions Disp Refills   FEROSUL 325 (65 Fe) MG tablet [Pharmacy Med Name: FERROUS SULFATE 325MG  (5GR) TABS] 90 tablet 0    Sig: TAKE [place]      Endocrinology:  Minerals - Iron Supplementation Failed - 05/12/2020  3:33 AM      Failed - Fe (serum) in normal range and within 360 days    Iron  Date Value Ref Range Status  09/23/2015 79 27 - 139 ug/dL Final          Failed - Ferritin in normal range and within 360 days    Ferritin  Date Value Ref Range Status  09/23/2015 28 15 - 150 ng/mL Final          Passed - HGB in normal range and within 360 days    Hemoglobin  Date Value Ref Range Status  09/13/2019 11.7 11.1 - 15.9 g/dL Final          Passed - HCT in normal range and within 360 days    Hematocrit  Date Value Ref Range Status  09/13/2019 37.1 34.0 - 46.6 % Final          Passed - RBC in normal range and within 360 days    RBC  Date Value Ref Range Status  09/13/2019 4.94 3.77 - 5.28 x10E6/uL Final          Passed - Valid encounter within last 12 months    Recent Outpatient Visits           3 months ago Type 2 diabetes mellitus without complication, without long-term current use of insulin ([place])   [facility], [facility]   8 months ago Type 2 diabetes mellitus without complication, without long-term current use of insulin ([place])   [facility] Juline [facility], MD   1 year ago Diabetes mellitus without complication [facility])   [facility] Juline [facility], MD   1 year ago Annual physical exam   [facility], MD   1 year ago Dyslipidemia   [place], Deanna C, MD       Future Appointments             In 1  week Juline Patch, MD [facility], [facility]

## 2020-05-22 ENCOUNTER — Ambulatory Visit (INDEPENDENT_AMBULATORY_CARE_PROVIDER_SITE_OTHER): Payer: Medicare HMO | Admitting: Family Medicine

## 2020-05-22 ENCOUNTER — Other Ambulatory Visit: Payer: Self-pay

## 2020-05-22 ENCOUNTER — Encounter: Payer: Self-pay | Admitting: Family Medicine

## 2020-05-22 VITALS — BP 120/80 | HR 58 | Ht 65.0 in | Wt 122.0 lb

## 2020-05-22 DIAGNOSIS — E119 Type 2 diabetes mellitus without complications: Secondary | ICD-10-CM

## 2020-05-22 DIAGNOSIS — I10 Essential (primary) hypertension: Secondary | ICD-10-CM | POA: Diagnosis not present

## 2020-05-22 DIAGNOSIS — Z90710 Acquired absence of both cervix and uterus: Secondary | ICD-10-CM

## 2020-05-22 DIAGNOSIS — E785 Hyperlipidemia, unspecified: Secondary | ICD-10-CM

## 2020-05-22 DIAGNOSIS — D509 Iron deficiency anemia, unspecified: Secondary | ICD-10-CM | POA: Diagnosis not present

## 2020-05-22 MED ORDER — METFORMIN HCL 500 MG PO TABS: ORAL_TABLET | ORAL | 1 refills | Status: DC

## 2020-05-22 NOTE — Progress Notes (Signed)
Date:  05/22/2020   Name:  Vanessa Zamora   DOB:  [birth year]   MRN:  [id number]   Chief Complaint: Diabetes and Anemia  Diabetes She presents for her follow-up diabetic visit. She has type 2 diabetes mellitus. Her disease course has been stable. There are no hypoglycemic associated symptoms. Pertinent negatives for hypoglycemia include no dizziness, headaches or nervousness/anxiousness. There are no diabetic associated symptoms. Pertinent negatives for diabetes include no blurred vision, no chest pain, no fatigue, no foot paresthesias, no foot ulcerations, no polydipsia, no polyphagia, no polyuria and no weakness. There are no hypoglycemic complications. Symptoms are stable. There are no diabetic complications. Risk factors for coronary artery disease include dyslipidemia and hypertension. Current diabetic treatment includes oral agent (monotherapy) (metformen). She is following a generally healthy diet. She participates in exercise daily. Her breakfast blood glucose is taken between 8-9 am. Her breakfast blood glucose range is generally 90-110 mg/dl. An ACE inhibitor/angiotensin II receptor blocker is not being taken.  Anemia Presents for follow-up visit. There has been no abdominal pain, anorexia, bruising/bleeding easily, fever, light-headedness or malaise/fatigue. Signs of blood loss that are not present include hematemesis, hematochezia, melena, menorrhagia and vaginal bleeding. Past medical history includes hypothyroidism. There is no history of chronic renal disease. There are no compliance problems.   Hyperlipidemia This is a chronic problem. The current episode started more than 1 year ago. The problem is controlled. Recent lipid tests were reviewed and are normal. Exacerbating diseases include diabetes and hypothyroidism. She has no history of chronic renal disease, liver disease, obesity or nephrotic syndrome. Pertinent negatives include no chest pain, focal sensory loss, focal weakness,  leg pain, myalgias or shortness of breath. Current antihyperlipidemic treatment includes statins. The current treatment provides mild improvement of lipids.    Lab Results  Component Value Date   CREATININE 0.72 09/13/2019   BUN 20 09/13/2019   NA 142 09/13/2019   K 3.9 09/13/2019   CL 103 09/13/2019   CO2 25 09/13/2019   Lab Results  Component Value Date   CHOL 201 (H) 09/13/2019   HDL 95 09/13/2019   LDLCALC 96 09/13/2019   TRIG 55 09/13/2019   CHOLHDL 2.2 01/26/2018   Lab Results  Component Value Date   TSH 1.840 09/13/2019   Lab Results  Component Value Date   HGBA1C 6.5 (H) 01/23/2020   Lab Results  Component Value Date   WBC 4.7 09/13/2019   HGB 11.7 09/13/2019   HCT 37.1 09/13/2019   MCV 75 (L) 09/13/2019   PLT 242 09/13/2019   Lab Results  Component Value Date   ALT 13 05/11/2019   AST 19 05/11/2019   ALKPHOS 44 05/11/2019   BILITOT 0.3 05/11/2019     Review of Systems  Constitutional: Negative.  Negative for chills, fatigue, fever, malaise/fatigue and unexpected weight change.  HENT: Negative for congestion, ear discharge, ear pain, rhinorrhea, sinus pressure, sneezing and sore throat.   Eyes: Negative for blurred vision, photophobia, pain, discharge, redness and itching.  Respiratory: Negative for cough, shortness of breath, wheezing and stridor.   Cardiovascular: Negative for chest pain.  Gastrointestinal: Negative for abdominal pain, anorexia, blood in stool, constipation, diarrhea, hematemesis, hematochezia, melena, nausea and vomiting.  Endocrine: Negative for cold intolerance, heat intolerance, polydipsia, polyphagia and polyuria.  Genitourinary: Negative for dysuria, flank pain, frequency, hematuria, menorrhagia, menstrual problem, pelvic pain, urgency, vaginal bleeding and vaginal discharge.  Musculoskeletal: Negative for arthralgias, back pain and myalgias.  Skin: Negative for rash.  Allergic/Immunologic: Negative for environmental allergies  and food allergies.  Neurological: Negative for dizziness, focal weakness, weakness, light-headedness, numbness and headaches.  Hematological: Negative for adenopathy. Does not bruise/bleed easily.  Psychiatric/Behavioral: Negative for dysphoric mood. The patient is not nervous/anxious.     Patient Active Problem List   Diagnosis Date Noted  . H/O total hysterectomy 05/22/2020  . Myocardial infarction ([place]) 01/24/2018  . Coronary artery disease 01/24/2018  . Diabetes ([place]) 01/24/2018  . Heart murmur 01/24/2018  . Anemia 12/19/2015  . Osteoarthritis of left glenohumeral joint 06/20/2015  . Type 2 diabetes mellitus without complication, without long-term current use of insulin ([place]) 06/07/2014  . Hypercholesterolemia 06/07/2014  . Dyslipidemia 06/07/2014  . Hypertension 06/07/2014  . 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 Routine general medical examination at a health care facility 06/07/2014  . Screening for depression 06/07/2014    Allergies  Allergen Reactions  . Ace Inhibitors Swelling  . Altace  [Ramipril] Anaphylaxis  . Clonidine Swelling  . Losartan Potassium Other (See Comments)  . Levothyroxine Rash    Past Surgical History:  Procedure Laterality Date  . CARDIAC CATHETERIZATION  2005  . CATARACT EXTRACTION Left 10/2014  . CATARACT EXTRACTION W/PHACO Right 11/19/2014   Procedure: CATARACT EXTRACTION PHACO AND INTRAOCULAR LENS PLACEMENT ([place]);  Surgeon: Ronnell Freshwater, MD;  Location: [facility];  Service: Ophthalmology;  Laterality: Right;  DIABETIC - oral meds  . COLONOSCOPY WITH PROPOFOL N/A 06/30/2016   Procedure: COLONOSCOPY WITH PROPOFOL;  Surgeon: Lollie Sails, MD;  [facility] ENDOSCOPY;  Service: Endoscopy;  Laterality: N/A;  . EYE SURGERY    . VAGINAL HYSTERECTOMY  02/10/1980    Social History   Tobacco Use  . Smoking status: Former Smoker    Packs/day: 0.25    Years: 30.00    Pack years: 7.50    Types: Cigarettes    Quit date: 2003     Years since quitting: 19.2  . Smokeless tobacco: Never Used  . Tobacco comment: quit 2003  Vaping Use  . Vaping Use: Never used  Substance Use Topics  . Alcohol use: No    Alcohol/week: 0.0 standard drinks  . Drug use: No     Medication list has been reviewed and updated.  Current Meds  Medication Sig  . aspirin 81 MG tablet Take 1 tablet (81 mg total) by mouth daily.  . Calcium Carbonate-Vit D-Min (CALTRATE 600+D PLUS MINERALS) 600-800 MG-UNIT TABS Take 1 tablet by mouth daily.  . carvedilol (COREG) 6.25 MG tablet TAKE 1 TABLET(6.25 MG) BY MOUTH TWICE DAILY  . docusate sodium (COLACE) 100 MG capsule Take 100 mg by mouth daily as needed for mild constipation.  . FEROSUL 325 (65 Fe) MG tablet TAKE 1 TABLET BY MOUTH EVERY OTHER DAY  . hydrochlorothiazide (HYDRODIURIL) 25 MG tablet TAKE 1 TABLET(25 MG) BY MOUTH DAILY  . metFORMIN (GLUCOPHAGE) 500 MG tablet TAKE 1 TABLET BY MOUTH TWICE DAILY  . Multiple Vitamins-Minerals (CENTRUM SILVER ULTRA WOMENS) TABS Take 1 tablet by mouth daily.  Marland Kitchen olmesartan (BENICAR) 5 MG tablet TAKE 1 TABLET(5 MG) BY MOUTH DAILY  . Omega-3 Fatty Acids (FISH OIL) 1000 MG CAPS Take by mouth.  Glory Rosebush ULTRA test strip TEST ONCE DAILY  . pravastatin (PRAVACHOL) 40 MG tablet TAKE 1 TABLET(40 MG) BY MOUTH DAILY  . SYNTHROID 25 MCG tablet Take 1 tablet by mouth daily. Dr Truddie Coco    [facility] 2/9 Scores 02/19/2020 01/23/2020 09/13/2019 05/11/2019  PHQ - 2 Score 0 0 0 0  PHQ-  9 Score - 0 1 0    GAD 7 : Generalized Anxiety Score 01/23/2020 09/13/2019 05/11/2019  Nervous, Anxious, on Edge 0 0 0  Control/stop worrying 0 0 0  Worry too much - different things 0 0 0  Trouble relaxing 0 0 0  Restless 0 0 0  Easily annoyed or irritable 0 0 0  Afraid - awful might happen 0 0 0  Total GAD 7 Score 0 0 0  Anxiety Difficulty - Not difficult at all Not difficult at all    BP Readings from Last 3 Encounters:  05/22/20 120/80  02/19/20 112/80  01/23/20 130/80    Physical  Exam Vitals and nursing note reviewed.  Constitutional:      Appearance: She is well-developed.  HENT:     Head: Normocephalic.     Right Ear: Tympanic membrane and external ear normal.     Left Ear: Tympanic membrane and external ear normal.     Nose: Nose normal.     Mouth/Throat:     Mouth: Mucous membranes are moist.  Eyes:     General: Lids are everted, no foreign bodies appreciated. No scleral icterus.       Left eye: No foreign body or hordeolum.     Conjunctiva/sclera: Conjunctivae normal.     Right eye: Right conjunctiva is not injected.     Left eye: Left conjunctiva is not injected.     Pupils: Pupils are equal, round, and reactive to light.  Neck:     Thyroid: No thyromegaly.     Vascular: No JVD.     Trachea: No tracheal deviation.  Cardiovascular:     Rate and Rhythm: Normal rate and regular rhythm.     Heart sounds: Normal heart sounds. No murmur heard. No friction rub. No gallop.   Pulmonary:     Effort: Pulmonary effort is normal. No respiratory distress.     Breath sounds: Normal breath sounds. No wheezing, rhonchi or rales.  Abdominal:     General: Bowel sounds are normal.     Palpations: Abdomen is soft. There is no mass.     Tenderness: There is no abdominal tenderness. There is no guarding or rebound.  Musculoskeletal:        General: No tenderness. Normal range of motion.     Cervical back: Normal range of motion and neck supple.  Lymphadenopathy:     Cervical: No cervical adenopathy.  Skin:    General: Skin is warm.     Findings: No rash.  Neurological:     Mental Status: She is alert and oriented to person, place, and time.     Cranial Nerves: No cranial nerve deficit.     Deep Tendon Reflexes: Reflexes normal.  Psychiatric:        Mood and Affect: Mood is not anxious or depressed.     Wt Readings from Last 3 Encounters:  05/22/20 122 lb (55.3 kg)  02/19/20 123 lb (55.8 kg)  01/23/20 125 lb (56.7 kg)    BP 120/80   Pulse (!) 58   Ht  5\' 5"  (1.651 m)   Wt 122 lb (55.3 kg)   BMI 20.30 kg/m   Assessment and Plan:  1. Primary hypertension Chronic.  Controlled.  Stable.  Blood pressure 120/80.  We will obtain a CMP to evaluate electrolytes and GFR.  Patient will continue carvedilol 6.25 mg twice a day and hydrochlorothiazide 25 mg once a day and olmesartan 5 mg daily..- Comprehensive Metabolic Panel (  CMET)  2. Type 2 diabetes mellitus without complication, without long-term current use of insulin (HCC) Chronic.  Controlled.  Stable.  Continue Metformin 500 mg 1 twice a day.  Will check A1c comprehensive metabolic panel as well as microalbuminuria at this time - HgB A1c - Comprehensive Metabolic Panel (CMET) - metFORMIN (GLUCOPHAGE) 500 MG tablet; TAKE 1 TABLET BY MOUTH TWICE DAILY  Dispense: 180 tablet; Refill: 1 - Microalbumin, urine  3. Iron deficiency anemia, unspecified iron deficiency anemia type Panic.  Controlled.  Stable.  Patient will continue ferrous sulfate 325 mg daily.  Will check CBC for current status of anemia control. - CBC w/Diff/Platelet  4. Dyslipidemia .  Controlled.  Stable.  Patient is currently on omega-3.  Patient will continue on her pravastatin 40 mg daily.  Will check lipid panel for current level of control - Lipid Panel With LDL/HDL Ratio  5. H/O total hysterectomy Patient with current history of total hysterectomy not requiring continuance of cervical Pap smears due to age or necessity.

## 2020-05-23 ENCOUNTER — Telehealth: Payer: Self-pay

## 2020-05-23 LAB — CBC WITH DIFFERENTIAL/PLATELET
Basophils Absolute: 0 10*3/uL (ref 0.0–0.2)
Basos: 0 %
EOS (ABSOLUTE): 0.1 10*3/uL (ref 0.0–0.4)
Eos: 3 %
Hematocrit: 35.8 % (ref 34.0–46.6)
Hemoglobin: 11 g/dL — ABNORMAL LOW (ref 11.1–15.9)
Immature Grans (Abs): 0 10*3/uL (ref 0.0–0.1)
Immature Granulocytes: 0 %
Lymphocytes Absolute: 1.5 10*3/uL (ref 0.7–3.1)
Lymphs: 33 %
MCH: 23.1 pg — ABNORMAL LOW (ref 26.6–33.0)
MCHC: 30.7 g/dL — ABNORMAL LOW (ref 31.5–35.7)
MCV: 75 fL — ABNORMAL LOW (ref 79–97)
Monocytes Absolute: 0.6 10*3/uL (ref 0.1–0.9)
Monocytes: 13 %
Neutrophils Absolute: 2.4 10*3/uL (ref 1.4–7.0)
Neutrophils: 51 %
Platelets: 247 10*3/uL (ref 150–450)
RBC: 4.76 x10E6/uL (ref 3.77–5.28)
RDW: 15 % (ref 11.7–15.4)
WBC: 4.6 10*3/uL (ref 3.4–10.8)

## 2020-05-23 LAB — COMPREHENSIVE METABOLIC PANEL
ALT: 14 IU/L (ref 0–32)
AST: 19 IU/L (ref 0–40)
Albumin/Globulin Ratio: 1.9 (ref 1.2–2.2)
Albumin: 4.5 g/dL (ref 3.6–4.6)
Alkaline Phosphatase: 41 IU/L — ABNORMAL LOW (ref 44–121)
BUN/Creatinine Ratio: 26 (ref 12–28)
BUN: 24 mg/dL (ref 8–27)
Bilirubin Total: 0.4 mg/dL (ref 0.0–1.2)
CO2: 23 mmol/L (ref 20–29)
Calcium: 10.1 mg/dL (ref 8.7–10.3)
Chloride: 101 mmol/L (ref 96–106)
Creatinine, Ser: 0.92 mg/dL (ref 0.57–1.00)
Globulin, Total: 2.4 g/dL (ref 1.5–4.5)
Glucose: 88 mg/dL (ref 65–99)
Potassium: 3.8 mmol/L (ref 3.5–5.2)
Sodium: 143 mmol/L (ref 134–144)
Total Protein: 6.9 g/dL (ref 6.0–8.5)
eGFR: 63 mL/min/{1.73_m2} (ref >59)

## 2020-05-23 LAB — LIPID PANEL WITH LDL/HDL RATIO
Cholesterol, Total: 195 mg/dL (ref 100–199)
HDL: 93 mg/dL (ref >39)
LDL Chol Calc (NIH): 92 mg/dL (ref 0–99)
LDL/HDL Ratio: 1 ratio (ref 0.0–3.2)
Triglycerides: 53 mg/dL (ref 0–149)
VLDL Cholesterol Cal: 10 mg/dL (ref 5–40)

## 2020-05-23 LAB — MICROALBUMIN, URINE: Microalbumin, Urine: 14.1 ug/mL

## 2020-05-23 LAB — HEMOGLOBIN A1C
Est. average glucose Bld gHb Est-mCnc: 131 mg/dL
Hgb A1c MFr Bld: 6.2 % — ABNORMAL HIGH (ref 4.8–5.6)

## 2020-05-23 NOTE — Telephone Encounter (Signed)
Copied from [place] [phone]. Topic: General - Other >> May 23, 2020  3:05 PM Wynetta Emery, [place] C wrote: Reason for CRM: pt called in stating that she is returning the office call. Not showing a note in chart. Please assist pt further.

## 2020-05-27 NOTE — Telephone Encounter (Signed)
Discussed labs with pt and left copy up front for her to pick up

## 2020-06-10 LAB — HM DIABETES FOOT EXAM: HM Diabetic Foot Exam: NORMAL

## 2020-07-28 ENCOUNTER — Other Ambulatory Visit: Payer: Self-pay | Admitting: Family Medicine

## 2020-07-28 DIAGNOSIS — E119 Type 2 diabetes mellitus without complications: Secondary | ICD-10-CM

## 2020-07-28 DIAGNOSIS — I10 Essential (primary) hypertension: Secondary | ICD-10-CM

## 2020-07-28 NOTE — Telephone Encounter (Signed)
Requested Prescriptions  Pending Prescriptions Disp Refills  . hydrochlorothiazide (HYDRODIURIL) 25 MG tablet [Pharmacy Med Name: HYDROCHLOROTHIAZIDE 25MG  TABLETS] 90 tablet 0    Sig: TAKE 1 TABLET(25 MG) BY MOUTH DAILY     Cardiovascular: Diuretics - Thiazide Passed - 07/28/2020  3:35 AM      Passed - Ca in normal range and within 360 days    Calcium  Date Value Ref Range Status  05/22/2020 10.1 8.7 - 10.3 mg/dL Final         Passed - Cr in normal range and within 360 days    Creatinine, Ser  Date Value Ref Range Status  05/22/2020 0.92 0.57 - 1.00 mg/dL Final         Passed - K in normal range and within 360 days    Potassium  Date Value Ref Range Status  05/22/2020 3.8 3.5 - 5.2 mmol/L Final         Passed - Na in normal range and within 360 days    Sodium  Date Value Ref Range Status  05/22/2020 143 134 - 144 mmol/L Final         Passed - Last BP in normal range    BP Readings from Last 1 Encounters:  05/22/20 120/80         Passed - Valid encounter within last 6 months    Recent Outpatient Visits          2 months ago Primary hypertension   [facility], MD   6 months ago Type 2 diabetes mellitus without complication, without long-term current use of insulin ([place])   [facility], MD   10 months ago Type 2 diabetes mellitus without complication, without long-term current use of insulin ([place])   [facility]ine [facility], MD   1 year ago Diabetes mellitus without complication [facility])   [facility] Juline [facility], MD   1 year ago Annual physical exam   [place], Deanna C, MD      Future Appointments            In 1 month Juline Patch, MD [facility] [place]           . olmesartan (BENICAR) 5 MG tablet [Pharmacy Med Name: OLMESARTAN MEDOXOMIL 5MG  TABLETS] 90 tablet 0    Sig: TAKE 1 TABLET(5 MG) BY MOUTH DAILY     Cardiovascular:  Angiotensin Receptor  Blockers Passed - 07/28/2020  3:35 AM      Passed - Cr in normal range and within 180 days    Creatinine, Ser  Date Value Ref Range Status  05/22/2020 0.92 0.57 - 1.00 mg/dL Final         Passed - K in normal range and within 180 days    Potassium  Date Value Ref Range Status  05/22/2020 3.8 3.5 - 5.2 mmol/L Final         Passed - Patient is not pregnant      Passed - Last BP in normal range    BP Readings from Last 1 Encounters:  05/22/20 120/80         Passed - Valid encounter within last 6 months    Recent Outpatient Visits          2 months ago Primary hypertension   [facility], MD   6 months ago Type 2 diabetes mellitus without complication, without long-term  current use of insulin ([place])   [facility], MD   10 months ago Type 2 diabetes mellitus without complication, without long-term current use of insulin ([place])   [facility] Juline [facility], MD   1 year ago Diabetes mellitus without complication [facility])   [facility] Jul[facility], MD   1 year ago Annual physical exam   [place], MD      Future Appointments            In 1 month Juline Patch, MD [facility]           . carvedilol (COREG) 6.25 MG tablet [Pharmacy Med Name: CARVEDILOL 6.25MG  TABLETS] 180 tablet 0    Sig: TAKE 1 TABLET(6.25 MG) BY MOUTH TWICE DAILY     Cardiovascular:  Beta Blockers Passed - 07/28/2020  3:35 AM      Passed - Last BP in normal range    BP Readings from Last 1 Encounters:  05/22/20 120/80         Passed - Last Heart Rate in normal range    Pulse Readings from Last 1 Encounters:  05/22/20 (!) 48         Passed - Valid encounter within last 6 months    Recent Outpatient Visits          2 months ago Primary hypertension   [facility], MD   6 months ago Type 2 diabetes mellitus without complication, without long-term current use  of insulin ([place])   [facility]ine [facility], MD   10 months ago Type 2 diabetes mellitus without complication, without long-term current use of insulin ([place])   [facility] Juline Patch, MD   1 year ago Diabetes mellitus without complication [facility])   [facility] Jul[facility], MD   1 year ago Annual physical exam   [place], Deanna C, MD      Future Appointments            In 1 month Juline Patch, MD [facility], [facility]

## 2020-09-24 ENCOUNTER — Encounter: Payer: Self-pay | Admitting: Family Medicine

## 2020-09-24 ENCOUNTER — Other Ambulatory Visit: Payer: Self-pay

## 2020-09-24 ENCOUNTER — Ambulatory Visit: Payer: Medicare HMO | Admitting: Family Medicine

## 2020-09-24 VITALS — BP 122/74 | HR 64 | Ht 65.0 in | Wt 120.0 lb

## 2020-09-24 DIAGNOSIS — E119 Type 2 diabetes mellitus without complications: Secondary | ICD-10-CM | POA: Diagnosis not present

## 2020-09-24 MED ORDER — METFORMIN HCL 500 MG PO TABS: ORAL_TABLET | ORAL | 1 refills | Status: DC

## 2020-09-24 NOTE — Progress Notes (Signed)
Date:  09/24/2020   Name:  Vanessa Zamora   DOB:  10-31-1940   MRN:  [id number]   Chief Complaint: Diabetes  Diabetes She presents for her follow-up diabetic visit. She has type 2 diabetes mellitus. Her disease course has been stable. There are no hypoglycemic associated symptoms. Pertinent negatives for hypoglycemia include no dizziness, headaches or nervousness/anxiousness. Associated symptoms include weight loss. Pertinent negatives for diabetes include no blurred vision, no chest pain, no fatigue, no foot paresthesias, no foot ulcerations, no polydipsia, no polyphagia, no polyuria, no visual change and no weakness. There are no hypoglycemic complications. Symptoms are stable. Pertinent negatives for diabetic complications include no autonomic neuropathy, CVA, heart disease, nephropathy, peripheral neuropathy, PVD or retinopathy. Current diabetic treatment includes oral agent (monotherapy). She is following a generally healthy diet. An ACE inhibitor/angiotensin II receptor blocker is being taken.   Lab Results  Component Value Date   CREATININE 0.92 05/22/2020   BUN 24 05/22/2020   NA 143 05/22/2020   K 3.8 05/22/2020   CL 101 05/22/2020   CO2 23 05/22/2020   Lab Results  Component Value Date   CHOL 195 05/22/2020   HDL 93 05/22/2020   LDLCALC 92 05/22/2020   TRIG 53 05/22/2020   CHOLHDL 2.2 01/26/2018   Lab Results  Component Value Date   TSH 1.840 09/13/2019   Lab Results  Component Value Date   HGBA1C 6.2 (H) 05/22/2020   Lab Results  Component Value Date   WBC 4.6 05/22/2020   HGB 11.0 (L) 05/22/2020   HCT 35.8 05/22/2020   MCV 75 (L) 05/22/2020   PLT 247 05/22/2020   Lab Results  Component Value Date   ALT 14 05/22/2020   AST 19 05/22/2020   ALKPHOS 41 (L) 05/22/2020   BILITOT 0.4 05/22/2020     Review of Systems  Constitutional:  Positive for weight loss. Negative for chills, fatigue and fever.  HENT:  Negative for drooling, ear discharge, ear pain  and sore throat.   Eyes:  Negative for blurred vision.  Respiratory:  Negative for cough, shortness of breath and wheezing.   Cardiovascular:  Negative for chest pain, palpitations and leg swelling.  Gastrointestinal:  Negative for abdominal pain, blood in stool, constipation, diarrhea and nausea.  Endocrine: Negative for polydipsia, polyphagia and polyuria.  Genitourinary:  Negative for dysuria, frequency, hematuria and urgency.  Musculoskeletal:  Negative for back pain, myalgias and neck pain.  Skin:  Negative for rash.  Allergic/Immunologic: Negative for environmental allergies.  Neurological:  Negative for dizziness, weakness and headaches.  Hematological:  Does not bruise/bleed easily.  Psychiatric/Behavioral:  Negative for suicidal ideas. The patient is not nervous/anxious.    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   Type 2 diabetes mellitus without complication, without long-term current use of insulin ([place]) 06/07/2014   Hypercholesterolemia 06/07/2014   Dyslipidemia 06/07/2014   Hypertension 06/07/2014   Thyrotoxicosis 06/07/2014   Routine general medical examination at a health care facility 06/07/2014   Screening for depression 06/07/2014    Allergies  Allergen Reactions   Ace Inhibitors Swelling   Altace  [Ramipril] Anaphylaxis   Clonidine Swelling   Losartan Potassium Other (See Comments)   Levothyroxine Rash    Past Surgical History:  Procedure Laterality Date   CARDIAC CATHETERIZATION  2005   CATARACT EXTRACTION Left 10/2014   CATARACT EXTRACTION W/PHACO Right 11/19/2014   Procedure: CATARACT EXTRACTION PHACO AND INTRAOCULAR LENS PLACEMENT ([place]);  Surgeon: Ronnell Freshwater, MD;  Location: [facility];  Service: Ophthalmology;  Laterality: Right;  DIABETIC - oral meds   COLONOSCOPY WITH PROPOFOL N/A 06/30/2016   Procedure: COLONOSCOPY WITH PROPOFOL;  Surgeon: Lollie Sails, MD;  Location: [facility] END[facility]COPY;  Service: Endoscopy;  Laterality: N/A;   EYE SURGERY     VAGINAL HYSTERECTOMY  02/10/1980    Social History   Tobacco Use   Smoking status: Former    Packs/day: 0.25    Years: 30.00    Pack years: 7.50    Types: Cigarettes    Quit date: 2003    Years since quitting: 19.6   Smokeless tobacco: Never   Tobacco comments:    quit 2003  Vaping Use   Vaping Use: Never used  Substance Use Topics   Alcohol use: No    Alcohol/week: 0.0 standard drinks   Drug use: No     Medication list has been reviewed and updated.  Current Meds  Medication Sig   aspirin 81 MG tablet Take 1 tablet (81 mg total) by mouth daily.   Calcium Carbonate-Vit D-Min (CALTRATE 600+D PLUS MINERALS) 600-800 MG-UNIT TABS Take 1 tablet by mouth daily.   carvedilol (COREG) 6.25 MG tablet TAKE 1 TABLET(6.25 MG) BY MOUTH TWICE DAILY   docusate sodium (COLACE) 100 MG capsule Take 100 mg by mouth daily as needed for mild constipation.   FEROSUL 325 (65 Fe) MG tablet TAKE 1 TABLET BY MOUTH EVERY OTHER DAY   hydrochlorothiazide (HYDRODIURIL) 25 MG tablet TAKE 1 TABLET(25 MG) BY MOUTH DAILY   metFORMIN (GLUCOPHAGE) 500 MG tablet TAKE 1 TABLET BY MOUTH TWICE DAILY   Multiple Vitamins-Minerals (CENTRUM SILVER ULTRA WOMENS) TABS Take 1 tablet by mouth daily.   olmesartan (BENICAR) 5 MG tablet TAKE 1 TABLET(5 MG) BY MOUTH DAILY   Omega-3 Fatty Acids (FISH OIL) 1000 MG CAPS Take by mouth.   ONETOUCH ULTRA test strip TEST ONCE DAILY   pravastatin (PRAVACHOL) 40 MG tablet TAKE 1 TABLET(40 MG) BY MOUTH DAILY   SYNTHROID 25 MCG tablet Take 1 tablet by mouth daily. Dr Truddie Coco    [facility] 2/9 Scores 09/24/2020 02/19/2020 01/23/2020 09/13/2019  PHQ - 2 Score 0 0 0 0  PHQ- 9 Score 0 - 0 1    GAD 7 : Generalized  Anxiety Score 09/24/2020 01/23/2020 09/13/2019 05/11/2019  Nervous, Anxious, on Edge 0 0 0 0  Control/stop worrying 0 0 0 0  Worry too much - different things 0 0 0 0  Trouble relaxing 0 0 0 0  Restless 0 0 0 0  Easily annoyed or irritable 0 0 0 0  Afraid - awful might happen 0 0 0 0  Total GAD 7 Score 0 0 0 0  Anxiety Difficulty - - Not difficult at all Not difficult at all    BP Readings from Last 3 Encounters:  05/22/20 120/80  02/19/20 112/80  01/23/20 130/80    Physical Exam Vitals and nursing note reviewed.  Constitutional:      Appearance: She is well-developed.  HENT:     Head: Normocephalic.     Right Ear: Tympanic membrane, ear canal and external ear normal. There is no impacted cerumen.     Left Ear: Tympanic membrane, ear canal and external ear normal. There is  no impacted cerumen.     Nose: Nose normal.  Eyes:     General: Lids are everted, no foreign bodies appreciated. No scleral icterus.       Left eye: No foreign body or hordeolum.     Conjunctiva/sclera: Conjunctivae normal.     Right eye: Right conjunctiva is not injected.     Left eye: Left conjunctiva is not injected.     Pupils: Pupils are equal, round, and reactive to light.  Neck:     Thyroid: No thyromegaly.     Vascular: No carotid bruit or JVD.     Trachea: No tracheal deviation.  Cardiovascular:     Rate and Rhythm: Normal rate and regular rhythm.     Heart sounds: Normal heart sounds. No murmur heard.   No friction rub. No gallop.  Pulmonary:     Effort: Pulmonary effort is normal. No respiratory distress.     Breath sounds: Normal breath sounds. No wheezing, rhonchi or rales.  Abdominal:     General: Bowel sounds are normal.     Palpations: Abdomen is soft. There is no mass.     Tenderness: There is no abdominal tenderness. There is no guarding or rebound.  Musculoskeletal:        General: No tenderness. Normal range of motion.     Cervical back: Normal range of motion and neck supple.   Lymphadenopathy:     Cervical: No cervical adenopathy.  Skin:    General: Skin is warm.     Findings: No rash.  Neurological:     Mental Status: She is alert and oriented to person, place, and time.     Cranial Nerves: No cranial nerve deficit.     Deep Tendon Reflexes: Reflexes normal.  Psychiatric:        Mood and Affect: Mood is not anxious or depressed.    Wt Readings from Last 3 Encounters:  09/24/20 120 lb (54.4 kg)  05/22/20 122 lb (55.3 kg)  02/19/20 123 lb (55.8 kg)    Ht '5\' 5"'$  (1.651 m)   Wt 120 lb (54.4 kg)   BMI 19.97 kg/m   Assessment and Plan:  1. Type 2 diabetes mellitus without complication, without long-term current use of insulin (HCC) Chronic.  Controlled.  Stable.  Review of A1c's are in the 6.2-6.5 range.  We will continue metformin 500 mg twice a day which I have reassured patient is safe for her at this time.  We will check an A1c at this time and see if we are continuing in the similar range.  Recheck in 4 months - metFORMIN (GLUCOPHAGE) 500 MG tablet; TAKE 1 TABLET BY MOUTH TWICE DAILY  Dispense: 180 tablet; Refill: 1 - HgB A1c

## 2020-09-25 LAB — HEMOGLOBIN A1C
Est. average glucose Bld gHb Est-mCnc: 131 mg/dL
Hgb A1c MFr Bld: 6.2 % — ABNORMAL HIGH (ref 4.8–5.6)

## 2020-10-23 ENCOUNTER — Other Ambulatory Visit: Payer: Self-pay | Admitting: Family Medicine

## 2020-10-23 DIAGNOSIS — I10 Essential (primary) hypertension: Secondary | ICD-10-CM

## 2020-10-23 DIAGNOSIS — E119 Type 2 diabetes mellitus without complications: Secondary | ICD-10-CM

## 2020-10-31 ENCOUNTER — Other Ambulatory Visit: Payer: Self-pay | Admitting: Family Medicine

## 2020-10-31 DIAGNOSIS — E119 Type 2 diabetes mellitus without complications: Secondary | ICD-10-CM

## 2020-11-14 ENCOUNTER — Ambulatory Visit (INDEPENDENT_AMBULATORY_CARE_PROVIDER_SITE_OTHER): Payer: Medicare HMO

## 2020-11-14 ENCOUNTER — Other Ambulatory Visit: Payer: Self-pay

## 2020-11-14 DIAGNOSIS — Z23 Encounter for immunization: Secondary | ICD-10-CM | POA: Diagnosis not present

## 2020-11-15 ENCOUNTER — Telehealth: Payer: Self-pay

## 2020-11-15 NOTE — Telephone Encounter (Signed)
Called pt to see if there was anything that I could do to help. Told pt Vanessa Zamora and Dr. Ronnald Ramp are out of the office. Pt stated she will wait until Monday when they return 10/10.  KP

## 2020-11-15 NOTE — Telephone Encounter (Signed)
Copied from [place] [phone]. Topic: General - Other >> Nov 15, 2020 11:14 AM Alanda Slim E wrote: Reason for CRM: Pt called and would like to speak with Baxter Flattery / she wants to ask her a personal question and didn't want to say what see needed/ please advise

## 2020-11-26 LAB — HM DIABETES EYE EXAM

## 2020-12-17 ENCOUNTER — Encounter: Payer: Self-pay | Admitting: Family Medicine

## 2020-12-17 ENCOUNTER — Ambulatory Visit: Payer: Self-pay

## 2020-12-17 ENCOUNTER — Telehealth (INDEPENDENT_AMBULATORY_CARE_PROVIDER_SITE_OTHER): Payer: Medicare HMO | Admitting: Family Medicine

## 2020-12-17 VITALS — BP 120/80 | Temp 98.6°F

## 2020-12-17 DIAGNOSIS — U071 COVID-19: Secondary | ICD-10-CM

## 2020-12-17 MED ORDER — MOLNUPIRAVIR EUA 200MG CAPSULE: 4.0000 | ORAL_CAPSULE | Freq: Two times a day (BID) | ORAL | 0 refills | Status: AC

## 2020-12-17 NOTE — Patient Instructions (Signed)

## 2020-12-17 NOTE — Progress Notes (Addendum)
Date:  12/17/2020   Name:  Vanessa Zamora   DOB:  April 02, 1940   MRN:  [id number]   Chief Complaint: Covid Positive (Only symptom is sneezing, but has allergies)  I Army Fossa, MD from my office connected with this patient, any for, by telephone at the patient's home.  I verified that I am speaking with the correct person using two identifiers. This visit was conducted via telephone due to the Covid-19 outbreak from my office at [facility] in [place], [place]. I discussed the limitations, risks, security and privacy concerns of performing an evaluation and management service by telephone. I also discussed with the patient that there may be a patient responsible charge related to this service. The patient expressed understanding and agreed to proceed.    Fever  Chronicity: for covid. The problem occurs intermittently. Associated symptoms include coughing. Pertinent negatives include no congestion, diarrhea, ear pain, headaches, muscle aches, nausea or sore throat. Associated symptoms comments: sneezing.   Lab Results  Component Value Date   CREATININE 0.92 05/22/2020   BUN 24 05/22/2020   NA 143 05/22/2020   K 3.8 05/22/2020   CL 101 05/22/2020   CO2 23 05/22/2020   Lab Results  Component Value Date   CHOL 195 05/22/2020   HDL 93 05/22/2020   LDLCALC 92 05/22/2020   TRIG 53 05/22/2020   CHOLHDL 2.2 01/26/2018   Lab Results  Component Value Date   TSH 1.840 09/13/2019   Lab Results  Component Value Date   HGBA1C 6.2 (H) 09/24/2020   Lab Results  Component Value Date   WBC 4.6 05/22/2020   HGB 11.0 (L) 05/22/2020   HCT 35.8 05/22/2020   MCV 75 (L) 05/22/2020   PLT 247 05/22/2020   Lab Results  Component Value Date   ALT 14 05/22/2020   AST 19 05/22/2020   ALKPHOS 41 (L) 05/22/2020   BILITOT 0.4 05/22/2020     Review of Systems  Constitutional:  Positive for fever.  HENT:  Negative for congestion, ear pain and sore throat.   Respiratory:  Positive for  cough.   Gastrointestinal:  Negative for diarrhea and nausea.  Neurological:  Negative for headaches.   Patient Active Problem List   Diagnosis Date Noted   H/O total hysterectomy 05/22/2020   Myocardial infarction ([place]) 01/24/2018   Coronary artery disease 01/24/2018   Diabetes ([place]) 01/24/2018   Heart murmur 01/24/2018   Anemia 12/19/2015   Osteoarthritis of left glenohumeral joint 06/20/2015   Type 2 diabetes mellitus without complication, without long-term current use of insulin ([place]) 06/07/2014   Hypercholesterolemia 06/07/2014   Dyslipidemia 06/07/2014   Hypertension 06/07/2014   Thyrotoxicosis 06/07/2014   Routine general medical examination at a health care facility 06/07/2014   Screening for depression 06/07/2014    Allergies  Allergen Reactions   Ace Inhibitors Swelling   Altace  [Ramipril] Anaphylaxis   Clonidine Swelling   Losartan Potassium Other (See Comments)   Levothyroxine Rash    Past Surgical History:  Procedure Laterality Date   CARDIAC CATHETERIZATION  2005   CATARACT EXTRACTION Left 10/2014   CATARACT EXTRACTION W/PHACO Right 11/19/2014   Procedure: CATARACT EXTRACTION PHACO AND INTRAOCULAR LENS PLACEMENT ([place]);  Surgeon: Ronnell Freshwater, MD;  Location: [facility];  Service: Ophthalmology;  Laterality: Right;  DIABETIC - oral meds   COLONOSCOPY WITH PROPOFOL N/A 06/30/2016   Procedure: COLONOSCOPY WITH PROPOFOL;  Surgeon: Lollie Sails, MD;  Location: [facility] ENDOSCOPY;  Service: Endoscopy;  Laterality: N/A;   EYE SURGERY     VAGINAL HYSTERECTOMY  02/10/1980    Social History   Tobacco Use   Smoking status: Former    Packs/day: 0.25    Years: 30.00    Pack years: 7.50    Types: Cigarettes    Quit date: 2003    Years since quitting: 19.8   Smokeless tobacco: Never   Tobacco comments:    quit 2003  Vaping Use   Vaping Use: Never used  Substance Use Topics   Alcohol use: No    Alcohol/week: 0.0 standard drinks   Drug  use: No     Medication list has been reviewed and updated.  Current Meds  Medication Sig   aspirin 81 MG tablet Take 1 tablet (81 mg total) by mouth daily.   Calcium Carbonate-Vit D-Min (CALTRATE 600+D PLUS MINERALS) 600-800 MG-UNIT TABS Take 1 tablet by mouth daily.   carvedilol (COREG) 6.25 MG tablet TAKE 1 TABLET(6.25 MG) BY MOUTH TWICE DAILY   docusate sodium (COLACE) 100 MG capsule Take 100 mg by mouth daily as needed for mild constipation.   FEROSUL 325 (65 Fe) MG tablet TAKE 1 TABLET BY MOUTH EVERY OTHER DAY   hydrochlorothiazide (HYDRODIURIL) 25 MG tablet TAKE 1 TABLET(25 MG) BY MOUTH DAILY   metFORMIN (GLUCOPHAGE) 500 MG tablet TAKE 1 TABLET BY MOUTH TWICE DAILY   Multiple Vitamins-Minerals (CENTRUM SILVER ULTRA WOMENS) TABS Take 1 tablet by mouth daily.   olmesartan (BENICAR) 5 MG tablet TAKE 1 TABLET(5 MG) BY MOUTH DAILY   Omega-3 Fatty Acids (FISH OIL) 1000 MG CAPS Take by mouth.   ONETOUCH ULTRA test strip TEST ONCE DAILY   pravastatin (PRAVACHOL) 40 MG tablet TAKE 1 TABLET(40 MG) BY MOUTH DAILY   SYNTHROID 25 MCG tablet Take 1 tablet by mouth daily. Dr Truddie Coco    [facility] 2/9 Scores 12/17/2020 09/24/2020 02/19/2020 01/23/2020  PHQ - 2 Score 0 0 0 0  PHQ- 9 Score 0 0 - 0    GAD 7 : Generalized Anxiety Score 12/17/2020 09/24/2020 01/23/2020 09/13/2019  Nervous, Anxious, on Edge 0 0 0 0  Control/stop worrying 0 0 0 0  Worry too much - different things 0 0 0 0  Trouble relaxing 0 0 0 0  Restless 0 0 0 0  Easily annoyed or irritable 0 0 0 0  Afraid - awful might happen 0 0 0 0  Total GAD 7 Score 0 0 0 0  Anxiety Difficulty - - - Not difficult at all    BP Readings from Last 3 Encounters:  12/17/20 120/80  09/24/20 122/74  05/22/20 120/80    Physical Exam  Wt Readings from Last 3 Encounters:  09/24/20 120 lb (54.4 kg)  05/22/20 122 lb (55.3 kg)  02/19/20 123 lb (55.8 kg)    BP 120/80   Temp 98.6 F (37 C) (Oral)   Assessment and Plan:  1. COVID Patient was  interviewed for COVID positive testing.  2 criteria is met to identify patient.  Discussed symptoms and upcoming possibilities of complications that would necessitate hospital involvement.  Patient was encouraged to hydrate and molnupavir 800 mg twice a day for 5 days prescribed. - molnupiravir EUA (LAGEVRIO) 200 mg CAPS capsule; Take 4 capsules (800 mg total) by mouth 2 (two) times daily for 5 days.  Dispense: 40 capsule; Refill: 0   I spent 10 minutes with this patient, More than 50% of that time was spent in face to face education, counseling and  care coordination.

## 2020-12-17 NOTE — Telephone Encounter (Signed)
Pt exposed to covid during the weekend. Pt currently having no sx. Pt stated she is having her "normal" sx of sneezing due to change of season. Denies fever, chills, or any new sx. Pt has h/o MI. Pt has had 2 shots and 3 boosters. She is fully vaccinated. Pt would be interested in Paxlovid is Dr. Ronnald Ramp feels it is appropriate.  Care advice given and pt verbalized understanding.     Reason for Disposition  [1] COVID-19 diagnosed by positive lab test (e.g., PCR, rapid self-test kit) AND [2] NO symptoms (e.g., cough, fever, others)  Answer Assessment - Initial Assessment Questions 1. COVID-19 DIAGNOSIS: "Who made your COVID-19 diagnosis?" "Was it confirmed by a positive lab test or self-test?" If not diagnosed by a doctor (or NP/PA), ask "Are there lots of cases (community spread) where you live?" Note: See public health department website, if unsure.     Self test 2. COVID-19 EXPOSURE: "Was there any known exposure to COVID before the symptoms began?" CDC Definition of close contact: within 6 feet (2 meters) for a total of 15 minutes or more over a 24-hour period.      yes 3. ONSET: "When did the COVID-19 symptoms start?"      No sx 4. WORST SYMPTOM: "What is your worst symptom?" (e.g., cough, fever, shortness of breath, muscle aches)     Cough, sneezing 5. COUGH: "Do you have a cough?" If Yes, ask: "How bad is the cough?"       yes 6. FEVER: "Do you have a fever?" If Yes, ask: "What is your temperature, how was it measured, and when did it start?"     no 7. RESPIRATORY STATUS: "Describe your breathing?" (e.g., shortness of breath, wheezing, unable to speak)      no 8. BETTER-SAME-WORSE: "Are you getting better, staying the same or getting worse compared to yesterday?"  If getting worse, ask, "In what way?"     same 9. HIGH RISK DISEASE: "Do you have any chronic medical problems?" (e.g., asthma, heart or lung disease, weak immune system, obesity, etc.)     MI 10. VACCINE: "Have you had  the COVID-19 vaccine?" If Yes, ask: "Which one, how many shots, when did you get it?"       2 Pfizer 03/02/19 03/23/19 11. BOOSTER: "Have you received your COVID-19 booster?" If Yes, ask: "Which one and when did you get it?"       11/30/19  05/29/20 11/15/20 12. PREGNANCY: "Is there any chance you are pregnant?" "When was your last menstrual period?"       N/a 13. OTHER SYMPTOMS: "Do you have any other symptoms?"  (e.g., chills, fatigue, headache, loss of smell or taste, muscle pain, sore throat)       no 14. O2 SATURATION MONITOR:  "Do you use an oxygen saturation monitor (pulse oximeter) at home?" If Yes, ask "What is your reading (oxygen level) today?" "What is your usual oxygen saturation reading?" (e.g., 95%)       no  Protocols used: Coronavirus (COVID-19) Diagnosed or Suspected-A-AH

## 2021-01-16 ENCOUNTER — Other Ambulatory Visit: Payer: Self-pay | Admitting: Family Medicine

## 2021-01-16 DIAGNOSIS — Z1231 Encounter for screening mammogram for malignant neoplasm of breast: Secondary | ICD-10-CM

## 2021-01-19 ENCOUNTER — Other Ambulatory Visit: Payer: Self-pay | Admitting: Family Medicine

## 2021-01-19 DIAGNOSIS — E119 Type 2 diabetes mellitus without complications: Secondary | ICD-10-CM

## 2021-01-19 DIAGNOSIS — I10 Essential (primary) hypertension: Secondary | ICD-10-CM

## 2021-01-19 NOTE — Telephone Encounter (Signed)
Requested Prescriptions  Pending Prescriptions Disp Refills  . hydrochlorothiazide (HYDRODIURIL) 25 MG tablet [Pharmacy Med Name: HYDROCHLOROTHIAZIDE 25MG  TABLETS] 90 tablet 0    Sig: TAKE 1 TABLET(25 MG) BY MOUTH DAILY     Cardiovascular: Diuretics - Thiazide Passed - 01/19/2021  3:33 AM      Passed - Ca in normal range and within 360 days    Calcium  Date Value Ref Range Status  05/22/2020 10.1 8.7 - 10.3 mg/dL Final         Passed - Cr in normal range and within 360 days    Creatinine, Ser  Date Value Ref Range Status  05/22/2020 0.92 0.57 - 1.00 mg/dL Final         Passed - K in normal range and within 360 days    Potassium  Date Value Ref Range Status  05/22/2020 3.8 3.5 - 5.2 mmol/L Final         Passed - Na in normal range and within 360 days    Sodium  Date Value Ref Range Status  05/22/2020 143 134 - 144 mmol/L Final         Passed - Last BP in normal range    BP Readings from Last 1 Encounters:  12/17/20 120/80         Passed - Valid encounter within last 6 months    Recent Outpatient Visits          1 month ago [facility] Juline Patch, MD   3 months ago Type 2 diabetes mellitus without complication, without long-term current use of insulin ([place])   [facility] Juline Patch, MD   8 months ago Primary hypertension   [facility] Juline Patch, MD   12 months ago Type 2 diabetes mellitus without complication, without long-term current use of insulin ([place])   [facility] Juline Patch, MD   1 year ago Type 2 diabetes mellitus without complication, without long-term current use of insulin ([place])   [facility] Juline Patch, MD      Future Appointments            In 1 week Juline Patch, MD [facility], [place]           . olmesartan (BENICAR) 5 MG tablet [Pharmacy Med Name: OLMESARTAN MEDOXOMIL 5MG  TABLETS] 90 tablet     Sig: TAKE 1 TABLET(5 MG) BY MOUTH DAILY      Cardiovascular:  Angiotensin Receptor Blockers Failed - 01/19/2021  3:33 AM      Failed - Cr in normal range and within 180 days    Creatinine, Ser  Date Value Ref Range Status  05/22/2020 0.92 0.57 - 1.00 mg/dL Final         Failed - K in normal range and within 180 days    Potassium  Date Value Ref Range Status  05/22/2020 3.8 3.5 - 5.2 mmol/L Final         Passed - Patient is not pregnant      Passed - Last BP in normal range    BP Readings from Last 1 Encounters:  12/17/20 120/80         Passed - Valid encounter within last 6 months    Recent Outpatient Visits          1 month ago [place], [place]   3 months ago Type 2 diabetes  mellitus without complication, without long-term current use of insulin ([place])   [facility] Juline Patch, MD   8 months ago Primary hypertension   [facility] Juline Patch, MD   12 months ago Type 2 diabetes mellitus without complication, without long-term current use of insulin ([place])   [facility] Juline Patch, MD   1 year ago Type 2 diabetes mellitus without complication, without long-term current use of insulin ([place])   [facility] Juline Patch, MD      Future Appointments            In 1 week Juline Patch, MD [facility], [place]           . metFORMIN (GLUCOPHAGE) 500 MG tablet [Pharmacy Med Name: METFORMIN 500MG TABLETS] 180 tablet 1    Sig: TAKE 1 TABLET BY MOUTH TWICE DAILY     Endocrinology:  Diabetes - Biguanides Failed - 01/19/2021  3:33 AM      Failed - AA eGFR in normal range and within 360 days    GFR calc Af Amer  Date Value Ref Range Status  09/13/2019 91 >59 mL/min/1.73 Final    Comment:    **[facility] currently reports eGFR in compliance with the current**   recommendations of the [employer]. [facility] will   update reporting as new guidelines are published from the NKF-ASN   Task force.    GFR calc non Af  Amer  Date Value Ref Range Status  09/13/2019 79 >59 mL/min/1.73 Final   eGFR  Date Value Ref Range Status  05/22/2020 63 >59 mL/min/1.73 Final         Passed - Cr in normal range and within 360 days    Creatinine, Ser  Date Value Ref Range Status  05/22/2020 0.92 0.57 - 1.00 mg/dL Final         Passed - HBA1C is between 0 and 7.9 and within 180 days    Hgb A1c MFr Bld  Date Value Ref Range Status  09/24/2020 6.2 (H) 4.8 - 5.6 % Final    Comment:             Prediabetes: 5.7 - 6.4          Diabetes: >6.4          Glycemic control for adults with diabetes: <7.0          Passed - Valid encounter within last 6 months    Recent Outpatient Visits          1 month ago [facility] Juline Patch, MD   3 months ago Type 2 diabetes mellitus without complication, without long-term current use of insulin ([place])   [facility] Juline Patch, MD   8 months ago Primary hypertension   [facility] Juline Patch, MD   12 months ago Type 2 diabetes mellitus without complication, without long-term current use of insulin ([place])   [facility] Juline Patch, MD   1 year ago Type 2 diabetes mellitus without complication, without long-term current use of insulin ([place])   [facility] Juline Patch, MD      Future Appointments            In 1 week Juline Patch, MD [facility]

## 2021-01-19 NOTE — Telephone Encounter (Signed)
Requested medication (s) are due for refill today: yes  Requested medication (s) are on the active medication list: yes  Last refill:  10/23/20 #90  Future visit scheduled: yes  Notes to clinic:  overdue lab work - routed to office as per protocol   Requested Prescriptions  Pending Prescriptions Disp Refills   olmesartan (BENICAR) 5 MG tablet [Pharmacy Med Name: OLMESARTAN MEDOXOMIL 5MG TABLETS] 90 tablet     Sig: TAKE 1 TABLET(5 MG) BY MOUTH DAILY     Cardiovascular:  Angiotensin Receptor Blockers Failed - 01/19/2021  3:33 AM      Failed - Cr in normal range and within 180 days    Creatinine, Ser  Date Value Ref Range Status  05/22/2020 0.92 0.57 - 1.00 mg/dL Final          Failed - K in normal range and within 180 days    Potassium  Date Value Ref Range Status  05/22/2020 3.8 3.5 - 5.2 mmol/L Final          Passed - Patient is not pregnant      Passed - Last BP in normal range    BP Readings from Last 1 Encounters:  12/17/20 120/80          Passed - Valid encounter within last 6 months    Recent Outpatient Visits           1 month ago [facility] Juline Patch, MD   3 months ago Type 2 diabetes mellitus without complication, without long-term current use of insulin ([place])   [facility] Jul[facility], MD   8 months ago Primary hypertension   [facility], MD   12 months ago Type 2 diabetes mellitus without complication, without long-term current use of insulin ([place])   [facility] Juline Patch, MD   1 year ago Type 2 diabetes mellitus without complication, without long-term current use of insulin ([place])   [facility] Juline Patch, MD       Future Appointments             In 1 week Juline Patch, MD [facility]            Signed Prescriptions Disp Refills   hydrochlorothiazide (HYDRODIURIL) 25 MG tablet 90 tablet 0    Sig: TAKE 1 TABLET(25 MG) BY MOUTH  DAILY     Cardiovascular: Diuretics - Thiazide Passed - 01/19/2021  3:33 AM      Passed - Ca in normal range and within 360 days    Calcium  Date Value Ref Range Status  05/22/2020 10.1 8.7 - 10.3 mg/dL Final          Passed - Cr in normal range and within 360 days    Creatinine, Ser  Date Value Ref Range Status  05/22/2020 0.92 0.57 - 1.00 mg/dL Final          Passed - K in normal range and within 360 days    Potassium  Date Value Ref Range Status  05/22/2020 3.8 3.5 - 5.2 mmol/L Final          Passed - Na in normal range and within 360 days    Sodium  Date Value Ref Range Status  05/22/2020 143 134 - 144 mmol/L Final          Passed - Last BP in normal range    BP Readings from  Last 1 Encounters:  12/17/20 120/80          Passed - Valid encounter within last 6 months    Recent Outpatient Visits           1 month ago [facility] Juline Patch, MD   3 months ago Type 2 diabetes mellitus without complication, without long-term current use of insulin ([place])   [facility] Juline Patch, MD   8 months ago Primary hypertension   [facility] Juline Patch, MD   12 months ago Type 2 diabetes mellitus without complication, without long-term current use of insulin ([place])   [facility] Juline Patch, MD   1 year ago Type 2 diabetes mellitus without complication, without long-term current use of insulin ([place])   [facility] Juline Patch, MD       Future Appointments             In 1 week Juline Patch, MD [facility]             metFORMIN (GLUCOPHAGE) 500 MG tablet 180 tablet 1    Sig: TAKE 1 TABLET BY MOUTH TWICE DAILY     Endocrinology:  Diabetes - Biguanides Failed - 01/19/2021  3:33 AM      Failed - AA eGFR in normal range and within 360 days    GFR calc Af Amer  Date Value Ref Range Status  09/13/2019 91 >59 mL/min/1.73 Final    Comment:    **[facility] currently  reports eGFR in compliance with the current**   recommendations of the [employer]. [facility] will   update reporting as new guidelines are published from the NKF-ASN   Task force.    GFR calc non Af Amer  Date Value Ref Range Status  09/13/2019 79 >59 mL/min/1.73 Final   eGFR  Date Value Ref Range Status  05/22/2020 63 >59 mL/min/1.73 Final          Passed - Cr in normal range and within 360 days    Creatinine, Ser  Date Value Ref Range Status  05/22/2020 0.92 0.57 - 1.00 mg/dL Final          Passed - HBA1C is between 0 and 7.9 and within 180 days    Hgb A1c MFr Bld  Date Value Ref Range Status  09/24/2020 6.2 (H) 4.8 - 5.6 % Final    Comment:             Prediabetes: 5.7 - 6.4          Diabetes: >6.4          Glycemic control for adults with diabetes: <7.0           Passed - Valid encounter within last 6 months    Recent Outpatient Visits           1 month ago [facility] Juline Patch, MD   3 months ago Type 2 diabetes mellitus without complication, without long-term current use of insulin ([place])   [facility] Juline Patch, MD   8 months ago Primary hypertension   [facility] Jul[facility], MD   12 months ago Type 2 diabetes mellitus without complication, without long-term current use of insulin [facility])   [facility] Juline Patch, MD   1 year ago Type 2 diabetes mellitus without complication, without long-term current use of  insulin [facility])   [facility] Juline Patch, MD       Future Appointments             In 1 week Juline Patch, MD [facility], [facility]

## 2021-01-22 ENCOUNTER — Other Ambulatory Visit: Payer: Self-pay | Admitting: Family Medicine

## 2021-01-22 ENCOUNTER — Telehealth: Payer: Self-pay | Admitting: Family Medicine

## 2021-01-22 DIAGNOSIS — E785 Hyperlipidemia, unspecified: Secondary | ICD-10-CM

## 2021-01-22 DIAGNOSIS — E7800 Pure hypercholesterolemia, unspecified: Secondary | ICD-10-CM

## 2021-01-22 NOTE — Telephone Encounter (Signed)
Copied from Laura [id number]. Topic: General - Other >> Jan 22, 2021  3:16 PM Tessa Lerner A wrote: Reason for CRM: The patient has missed a call from the practice  Please contact further when possible

## 2021-01-22 NOTE — Telephone Encounter (Signed)
Requested Prescriptions  Pending Prescriptions Disp Refills   pravastatin (PRAVACHOL) 40 MG tablet [Pharmacy Med Name: PRAVASTATIN 40MG  TABLETS] 90 tablet 2    Sig: TAKE 1 TABLET(40 MG) BY MOUTH DAILY     Cardiovascular:  Antilipid - Statins Passed - 01/22/2021  3:34 AM      Passed - Total Cholesterol in normal range and within 360 days    Cholesterol, Total  Date Value Ref Range Status  05/22/2020 195 100 - 199 mg/dL Final         Passed - LDL in normal range and within 360 days    LDL Chol Calc (NIH)  Date Value Ref Range Status  05/22/2020 92 0 - 99 mg/dL Final         Passed - HDL in normal range and within 360 days    HDL  Date Value Ref Range Status  05/22/2020 93 >39 mg/dL Final         Passed - Triglycerides in normal range and within 360 days    Triglycerides  Date Value Ref Range Status  05/22/2020 53 0 - 149 mg/dL Final         Passed - Patient is not pregnant      Passed - Valid encounter within last 12 months    Recent Outpatient Visits          1 month ago [place], Deanna C, MD   4 months ago Type 2 diabetes mellitus without complication, without long-term current use of insulin ([place])   [facility] Juline Patch, MD   8 months ago Primary hypertension   [facility] Juline Patch, MD   1 year ago Type 2 diabetes mellitus without complication, without long-term current use of insulin ([place])   [facility] Juline Patch, MD   1 year ago Type 2 diabetes mellitus without complication, without long-term current use of insulin ([place])   [place], Deanna C, MD      Future Appointments            In 5 days Juline Patch, MD [facility]

## 2021-01-27 ENCOUNTER — Ambulatory Visit (INDEPENDENT_AMBULATORY_CARE_PROVIDER_SITE_OTHER): Payer: Medicare HMO | Admitting: Family Medicine

## 2021-01-27 ENCOUNTER — Encounter: Payer: Self-pay | Admitting: Family Medicine

## 2021-01-27 ENCOUNTER — Other Ambulatory Visit: Payer: Self-pay

## 2021-01-27 VITALS — BP 120/80 | HR 64 | Ht 65.0 in | Wt 121.0 lb

## 2021-01-27 DIAGNOSIS — E119 Type 2 diabetes mellitus without complications: Secondary | ICD-10-CM | POA: Diagnosis not present

## 2021-01-27 NOTE — Progress Notes (Deleted)
° ° °Date:  01/27/2021  ° °Name:  Vanessa Zamora   DOB:  07/16/1940   MRN:  [id number] ° ° °Chief Complaint: Diabetes ° °Diabetes ° ° °Lab Results  °Component Value Date  ° NA 143 05/22/2020  ° K 3.8 05/22/2020  ° CO2 23 05/22/2020  ° GLUCOSE 88 05/22/2020  ° BUN 24 05/22/2020  ° CREATININE 0.92 05/22/2020  ° CALCIUM 10.1 05/22/2020  ° EGFR 63 05/22/2020  ° GFRNONAA 79 09/13/2019  ° °Lab Results  °Component Value Date  ° CHOL 195 05/22/2020  ° HDL 93 05/22/2020  ° LDLCALC 92 05/22/2020  ° TRIG 53 05/22/2020  ° CHOLHDL 2.2 01/26/2018  ° °Lab Results  °Component Value Date  ° TSH 1.840 09/13/2019  ° °Lab Results  °Component Value Date  ° HGBA1C 6.2 (H) 09/24/2020  ° °Lab Results  °Component Value Date  ° WBC 4.6 05/22/2020  ° HGB 11.0 (L) 05/22/2020  ° HCT 35.8 05/22/2020  ° MCV 75 (L) 05/22/2020  ° PLT 247 05/22/2020  ° °Lab Results  °Component Value Date  ° ALT 14 05/22/2020  ° AST 19 05/22/2020  ° ALKPHOS 41 (L) 05/22/2020  ° BILITOT 0.4 05/22/2020  ° °No results found for: 25OHVITD2, 25OHVITD3, VD25OH  ° °Review of Systems ° °Patient Active Problem List  ° Diagnosis Date Noted  ° H/O total hysterectomy 05/22/2020  ° Myocardial infarction (HCC) 01/24/2018  ° Coronary artery disease 01/24/2018  ° Diabetes (HCC) 01/24/2018  ° Heart murmur 01/24/2018  ° Anemia 12/19/2015  ° Osteoarthritis of left glenohumeral joint 06/20/2015  ° Type 2 diabetes mellitus without complication, without long-term current use of insulin (HCC) 06/07/2014  ° Hypercholesterolemia 06/07/2014  ° Dyslipidemia 06/07/2014  ° Hypertension 06/07/2014  ° Thyrotoxicosis 06/07/2014  ° Routine general medical examination at a health care facility 06/07/2014  ° Screening for depression 06/07/2014  ° ° °Allergies  °Allergen Reactions  ° Ace Inhibitors Swelling  ° Altace  [Ramipril] Anaphylaxis  ° Clonidine Swelling  ° Losartan Potassium Other (See Comments)  ° Levothyroxine Rash  ° ° °Past Surgical History:  °Procedure Laterality Date  ° CARDIAC  CATHETERIZATION  2005  ° CATARACT EXTRACTION Left 10/2014  ° CATARACT EXTRACTION W/PHACO Right 11/19/2014  ° Procedure: CATARACT EXTRACTION PHACO AND INTRAOCULAR LENS PLACEMENT (IOC);  Surgeon: Anita Prakash Vin-Parikh, MD;  Location: [facility];  Service: Ophthalmology;  Laterality: Right;  DIABETIC - oral meds  ° COLONOSCOPY WITH PROPOFOL N/A 06/30/2016  ° Procedure: COLONOSCOPY WITH PROPOFOL;  Surgeon: Skulskie, Martin U, MD;  Location: [facility];  Service: Endoscopy;  Laterality: N/A;  ° EYE SURGERY    ° VAGINAL HYSTERECTOMY  02/10/1980  ° ° °Social History  ° °Tobacco Use  ° Smoking status: Former  °  Packs/day: 0.25  °  Years: 30.00  °  Pack years: 7.50  °  Types: Cigarettes  °  Quit date: 2003  °  Years since quitting: 19.9  ° Smokeless tobacco: Never  ° Tobacco comments:  °  quit 2003  °Vaping Use  ° Vaping Use: Never used  °Substance Use Topics  ° Alcohol use: No  °  Alcohol/week: 0.0 standard drinks  ° Drug use: No  ° ° ° °Medication list has been reviewed and updated. ° °Current Meds  °Medication Sig  ° aspirin 81 MG tablet Take 1 tablet (81 mg total) by mouth daily.  ° Calcium Carbonate-Vit D-Min (CALTRATE 600+D PLUS MINERALS) 600-800 MG-UNIT TABS Take 1 tablet by mouth daily.  °   carvedilol (COREG) 3.125 MG tablet Take 1 tablet by mouth 2 (two) times daily. [place]   docusate sodium (COLACE) 100 MG capsule Take 100 mg by mouth daily as needed for mild constipation.   FEROSUL 325 (65 Fe) MG tablet TAKE 1 TABLET BY MOUTH EVERY OTHER DAY   hydrochlorothiazide (HYDRODIURIL) 25 MG tablet TAKE 1 TABLET(25 MG) BY MOUTH DAILY   metFORMIN (GLUCOPHAGE) 500 MG tablet TAKE 1 TABLET BY MOUTH TWICE DAILY   Multiple Vitamins-Minerals (CENTRUM SILVER ULTRA WOMENS) TABS Take 1 tablet by mouth daily.   olmesartan (BENICAR) 5 MG tablet TAKE 1 TABLET(5 MG) BY MOUTH DAILY   Omega-3 Fatty Acids (FISH OIL) 1000 MG CAPS Take by mouth.   ONETOUCH ULTRA test strip TEST ONCE DAILY   pravastatin (PRAVACHOL) 40  MG tablet TAKE 1 TABLET(40 MG) BY MOUTH DAILY   SYNTHROID 25 MCG tablet Take 1 tablet by mouth daily. Dr Truddie Coco    [facility] 2/9 Scores 01/27/2021 12/17/2020 09/24/2020 02/19/2020  PHQ - 2 Score 0 0 0 0  PHQ- 9 Score 0 0 0 -    GAD 7 : Generalized Anxiety Score 01/27/2021 12/17/2020 09/24/2020 01/23/2020  Nervous, Anxious, on Edge 0 0 0 0  Control/stop worrying 0 0 0 0  Worry too much - different things 0 0 0 0  Trouble relaxing 0 0 0 0  Restless 0 0 0 0  Easily annoyed or irritable 0 0 0 0  Afraid - awful might happen 0 0 0 0  Total GAD 7 Score 0 0 0 0  Anxiety Difficulty - - - -    BP Readings from Last 3 Encounters:  01/27/21 120/80  12/17/20 120/80  09/24/20 122/74    Physical Exam  Wt Readings from Last 3 Encounters:  01/27/21 121 lb (54.9 kg)  09/24/20 120 lb (54.4 kg)  05/22/20 122 lb (55.3 kg)    BP 120/80    Pulse 64    Ht 5' 5" (1.651 m)    Wt 121 lb (54.9 kg)    BMI 20.14 kg/m   Assessment and Plan:

## 2021-01-27 NOTE — Progress Notes (Signed)
Date:  01/27/2021   Name:  Vanessa Zamora   DOB:  08-Dec-1940   MRN:  [id number]   Chief Complaint: Diabetes  Diabetes She presents for her follow-up diabetic visit. She has type 2 diabetes mellitus. Her disease course has been stable. There are no hypoglycemic associated symptoms. Pertinent negatives for hypoglycemia include no dizziness, headaches or nervousness/anxiousness. There are no diabetic associated symptoms. Pertinent negatives for diabetes include no blurred vision, no chest pain, no fatigue, no foot paresthesias, no foot ulcerations, no polydipsia, no polyphagia, no polyuria, no visual change, no weakness and no weight loss. There are no hypoglycemic complications. Symptoms are stable. There are no diabetic complications. Risk factors for coronary artery disease include dyslipidemia. Current diabetic treatment includes diet and oral agent (monotherapy). She is compliant with treatment all of the time. Her weight is stable. She is following a generally healthy diet. Meal planning includes avoidance of concentrated sweets and carbohydrate counting. Her breakfast blood glucose is taken between 8-9 am. Her breakfast blood glucose range is generally 90-110 mg/dl. An ACE inhibitor/angiotensin II receptor blocker is being taken.   Lab Results  Component Value Date   NA 143 05/22/2020   K 3.8 05/22/2020   CO2 23 05/22/2020   GLUCOSE 88 05/22/2020   BUN 24 05/22/2020   CREATININE 0.92 05/22/2020   CALCIUM 10.1 05/22/2020   EGFR 63 05/22/2020   GFRNONAA 79 09/13/2019   Lab Results  Component Value Date   CHOL 195 05/22/2020   HDL 93 05/22/2020   LDLCALC 92 05/22/2020   TRIG 53 05/22/2020   CHOLHDL 2.2 01/26/2018   Lab Results  Component Value Date   TSH 1.840 09/13/2019   Lab Results  Component Value Date   HGBA1C 6.2 (H) 09/24/2020   Lab Results  Component Value Date   WBC 4.6 05/22/2020   HGB 11.0 (L) 05/22/2020   HCT 35.8 05/22/2020   MCV 75 (L) 05/22/2020   PLT  247 05/22/2020   Lab Results  Component Value Date   ALT 14 05/22/2020   AST 19 05/22/2020   ALKPHOS 41 (L) 05/22/2020   BILITOT 0.4 05/22/2020   No results found for: 25OHVITD2, 25OHVITD3, VD25OH   Review of Systems  Constitutional:  Negative for chills, fatigue, fever and weight loss.  HENT:  Negative for drooling, ear discharge, ear pain and sore throat.   Eyes:  Negative for blurred vision.  Respiratory:  Negative for cough, shortness of breath and wheezing.   Cardiovascular:  Negative for chest pain, palpitations and leg swelling.  Gastrointestinal:  Negative for abdominal pain, blood in stool, constipation, diarrhea and nausea.  Endocrine: Negative for polydipsia, polyphagia and polyuria.  Genitourinary:  Negative for dysuria, frequency, hematuria and urgency.  Musculoskeletal:  Negative for back pain, myalgias and neck pain.  Skin:  Negative for rash.  Allergic/Immunologic: Negative for environmental allergies.  Neurological:  Negative for dizziness, weakness and headaches.  Hematological:  Does not bruise/bleed easily.  Psychiatric/Behavioral:  Negative for suicidal ideas. The patient is not nervous/anxious.    Patient Active Problem List   Diagnosis Date Noted   H/O total hysterectomy 05/22/2020   Myocardial infarction [facility]) 01/24/2018   Coronary artery disease 01/24/2018   Diabetes ([place]) 01/24/2018   Heart murmur 01/24/2018   Anemia 12/19/2015   Osteoarthritis of left glenohumeral joint 06/20/2015   Type 2 diabetes mellitus without complication, without long-term current use of insulin ([place]) 06/07/2014   Hypercholesterolemia 06/07/2014   Dyslipidemia 06/07/2014   Hypertension  06/07/2014   Thyrotoxicosis 06/07/2014   Routine general medical examination at a health care facility 06/07/2014   Screening for depression 06/07/2014    Allergies  Allergen Reactions   Ace Inhibitors Swelling   Altace  [Ramipril] Anaphylaxis   Clonidine Swelling   Losartan Potassium  Other (See Comments)   Levothyroxine Rash    Past Surgical History:  Procedure Laterality Date   CARDIAC CATHETERIZATION  2005   CATARACT EXTRACTION Left 10/2014   CATARACT EXTRACTION W/PHACO Right 11/19/2014   Procedure: CATARACT EXTRACTION PHACO AND INTRAOCULAR LENS PLACEMENT ([place]);  Surgeon: Ronnell Freshwater, MD;  Location: [facility];  Service: Ophthalmology;  Laterality: Right;  DIABETIC - oral meds   COLONOSCOPY WITH PROPOFOL N/A 06/30/2016   Procedure: COLONOSCOPY WITH PROPOFOL;  Surgeon: Lollie Sails, MD;  Location: [facility];  Service: Endoscopy;  Laterality: N/A;   EYE SURGERY     VAGINAL HYSTERECTOMY  02/10/1980    Social History   Tobacco Use   Smoking status: Former    Packs/day: 0.25    Years: 30.00    Pack years: 7.50    Types: Cigarettes    Quit date: 2003    Years since quitting: 19.9   Smokeless tobacco: Never   Tobacco comments:    quit 2003  Vaping Use   Vaping Use: Never used  Substance Use Topics   Alcohol use: No    Alcohol/week: 0.0 standard drinks   Drug use: No     Medication list has been reviewed and updated.  Current Meds  Medication Sig   aspirin 81 MG tablet Take 1 tablet (81 mg total) by mouth daily.   Calcium Carbonate-Vit D-Min (CALTRATE 600+D PLUS MINERALS) 600-800 MG-UNIT TABS Take 1 tablet by mouth daily.   carvedilol (COREG) 3.125 MG tablet Take 1 tablet by mouth 2 (two) times daily. [place]   docusate sodium (COLACE) 100 MG capsule Take 100 mg by mouth daily as needed for mild constipation.   FEROSUL 325 (65 Fe) MG tablet TAKE 1 TABLET BY MOUTH EVERY OTHER DAY   hydrochlorothiazide (HYDRODIURIL) 25 MG tablet TAKE 1 TABLET(25 MG) BY MOUTH DAILY   metFORMIN (GLUCOPHAGE) 500 MG tablet TAKE 1 TABLET BY MOUTH TWICE DAILY   Multiple Vitamins-Minerals (CENTRUM SILVER ULTRA WOMENS) TABS Take 1 tablet by mouth daily.   olmesartan (BENICAR) 5 MG tablet TAKE 1 TABLET(5 MG) BY MOUTH DAILY   Omega-3 Fatty Acids  (FISH OIL) 1000 MG CAPS Take by mouth.   ONETOUCH ULTRA test strip TEST ONCE DAILY   pravastatin (PRAVACHOL) 40 MG tablet TAKE 1 TABLET(40 MG) BY MOUTH DAILY   SYNTHROID 25 MCG tablet Take 1 tablet by mouth daily. Dr Truddie Coco    [facility] 2/9 Scores 01/27/2021 12/17/2020 09/24/2020 02/19/2020  PHQ - 2 Score 0 0 0 0  PHQ- 9 Score 0 0 0 -    GAD 7 : Generalized Anxiety Score 01/27/2021 12/17/2020 09/24/2020 01/23/2020  Nervous, Anxious, on Edge 0 0 0 0  Control/stop worrying 0 0 0 0  Worry too much - different things 0 0 0 0  Trouble relaxing 0 0 0 0  Restless 0 0 0 0  Easily annoyed or irritable 0 0 0 0  Afraid - awful might happen 0 0 0 0  Total GAD 7 Score 0 0 0 0  Anxiety Difficulty - - - -    BP Readings from Last 3 Encounters:  01/27/21 120/80  12/17/20 120/80  09/24/20 122/74    Physical Exam  Vitals and nursing note reviewed.  Constitutional:      Appearance: She is well-developed.  HENT:     Head: Normocephalic.     Right Ear: External ear normal.     Left Ear: External ear normal.  Eyes:     General: Lids are everted, no foreign bodies appreciated. No scleral icterus.       Left eye: No foreign body or hordeolum.     Conjunctiva/sclera: Conjunctivae normal.     Right eye: Right conjunctiva is not injected.     Left eye: Left conjunctiva is not injected.     Pupils: Pupils are equal, round, and reactive to light.  Neck:     Thyroid: No thyromegaly.     Vascular: No JVD.     Trachea: No tracheal deviation.  Cardiovascular:     Rate and Rhythm: Normal rate and regular rhythm.     Heart sounds: Normal heart sounds. No murmur heard.   No friction rub. No gallop.  Pulmonary:     Effort: Pulmonary effort is normal. No respiratory distress.     Breath sounds: Normal breath sounds. No wheezing or rales.  Chest:     Chest wall: No mass.  Breasts:    Right: Normal. No swelling, bleeding, inverted nipple, mass, nipple discharge, skin change or tenderness.     Left: Normal.  No swelling, bleeding, inverted nipple, mass, nipple discharge, skin change or tenderness.  Abdominal:     General: Bowel sounds are normal.     Palpations: Abdomen is soft. There is no mass.     Tenderness: There is no abdominal tenderness. There is no guarding or rebound.  Musculoskeletal:        General: No tenderness. Normal range of motion.     Cervical back: Normal range of motion and neck supple.  Lymphadenopathy:     Cervical: No cervical adenopathy.     Upper Body:     Right upper body: No axillary adenopathy.     Left upper body: No axillary adenopathy.  Skin:    General: Skin is warm.     Findings: No rash.  Neurological:     Mental Status: She is alert and oriented to person, place, and time.     Cranial Nerves: No cranial nerve deficit.     Deep Tendon Reflexes: Reflexes normal.  Psychiatric:        Mood and Affect: Mood is not anxious or depressed.    Wt Readings from Last 3 Encounters:  01/27/21 121 lb (54.9 kg)  09/24/20 120 lb (54.4 kg)  05/22/20 122 lb (55.3 kg)    BP 120/80    Pulse 64    Ht '5\' 5"'  (1.651 m)    Wt 121 lb (54.9 kg)    BMI 20.14 kg/m   Assessment and Plan:  1. Type 2 diabetes mellitus without complication, without long-term current use of insulin (HCC) Chronic.  Controlled.  Stable.  Gradually improving.  Patient is currently on diet control as well is metformin 500 mg 1 twice a day.  Patient is tolerating medication well without side effects.  We will check A1c for current level of control. - HgB A1c

## 2021-01-28 LAB — HEMOGLOBIN A1C
Est. average glucose Bld gHb Est-mCnc: 143 mg/dL
Hgb A1c MFr Bld: 6.6 % — ABNORMAL HIGH (ref 4.8–5.6)

## 2021-02-17 ENCOUNTER — Other Ambulatory Visit: Payer: Self-pay

## 2021-02-17 ENCOUNTER — Ambulatory Visit
Admission: RE | Admit: 2021-02-17 | Discharge: 2021-02-17 | Disposition: A | Discharge status: Home / Self Care | Payer: Medicare HMO | Source: Ambulatory Visit | Attending: Family Medicine | Admitting: Family Medicine

## 2021-02-17 DIAGNOSIS — Z1231 Encounter for screening mammogram for malignant neoplasm of breast: Secondary | ICD-10-CM | POA: Insufficient documentation

## 2021-02-19 ENCOUNTER — Other Ambulatory Visit: Payer: Self-pay

## 2021-02-19 ENCOUNTER — Ambulatory Visit (INDEPENDENT_AMBULATORY_CARE_PROVIDER_SITE_OTHER): Payer: Medicare HMO

## 2021-02-19 VITALS — BP 142/92 | HR 71 | Temp 98.5°F | Resp 16 | Ht 65.0 in | Wt 120.6 lb

## 2021-02-19 DIAGNOSIS — Z0000 Encounter for general adult medical examination without abnormal findings: Secondary | ICD-10-CM

## 2021-02-19 NOTE — Patient Instructions (Signed)
Vanessa Zamora , Thank you for taking time to come for your Medicare Wellness Visit. I appreciate your ongoing commitment to your health goals. Please review the following plan we discussed and let me know if I can assist you in the future.   Screening recommendations/referrals: Colonoscopy: no longer required Mammogram: done 02/17/21 Bone Density: done 03/16/18 Recommended yearly ophthalmology/optometry visit for glaucoma screening and checkup Recommended yearly dental visit for hygiene and checkup  Vaccinations: Influenza vaccine: done 11/14/20 Pneumococcal vaccine: done 12/18/14 Tdap vaccine: done 01/28/16 Shingles vaccine: done 12/10/17 & 02/17/18   Covid-19:done 03/02/19, 03/23/19, 11/30/19, 05/29/20 & 11/15/20  Conditions/risks identified: Keep up the great work!  Next appointment: Follow up in one year for your annual wellness visit    Preventive Care 65 Years and Older, Female Preventive care refers to lifestyle choices and visits with your health care provider that can promote health and wellness. What does preventive care include? A yearly physical exam. This is also called an annual well check. Dental exams once or twice a year. Routine eye exams. Ask your health care provider how often you should have your eyes checked. Personal lifestyle choices, including: Daily care of your teeth and gums. Regular physical activity. Eating a healthy diet. Avoiding tobacco and drug use. Limiting alcohol use. Practicing safe sex. Taking low-dose aspirin every day. Taking vitamin and mineral supplements as recommended by your health care provider. What happens during an annual well check? The services and screenings done by your health care provider during your annual well check will depend on your age, overall health, lifestyle risk factors, and family history of disease. Counseling  Your health care provider may ask you questions about your: Alcohol use. Tobacco use. Drug use. Emotional  well-being. Home and relationship well-being. Sexual activity. Eating habits. History of falls. Memory and ability to understand (cognition). Work and work Statistician. Reproductive health. Screening  You may have the following tests or measurements: Height, weight, and BMI. Blood pressure. Lipid and cholesterol levels. These may be checked every 5 years, or more frequently if you are over 19 years old. Skin check. Lung cancer screening. You may have this screening every year starting at age 20 if you have a 30-pack-year history of smoking and currently smoke or have quit within the past 15 years. Fecal occult blood test (FOBT) of the stool. You may have this test every year starting at age 22. Flexible sigmoidoscopy or colonoscopy. You may have a sigmoidoscopy every 5 years or a colonoscopy every 10 years starting at age 4. Hepatitis C blood test. Hepatitis B blood test. Sexually transmitted disease (STD) testing. Diabetes screening. This is done by checking your blood sugar (glucose) after you have not eaten for a while (fasting). You may have this done every 1-3 years. Bone density scan. This is done to screen for osteoporosis. You may have this done starting at age 30. Mammogram. This may be done every 1-2 years. Talk to your health care provider about how often you should have regular mammograms. Talk with your health care provider about your test results, treatment options, and if necessary, the need for more tests. Vaccines  Your health care provider may recommend certain vaccines, such as: Influenza vaccine. This is recommended every year. Tetanus, diphtheria, and acellular pertussis (Tdap, Td) vaccine. You may need a Td booster every 10 years. Zoster vaccine. You may need this after age 64. Pneumococcal 13-valent conjugate (PCV13) vaccine. One dose is recommended after age 63. Pneumococcal polysaccharide (PPSV23) vaccine. One dose is  recommended after age 54. Talk to your  health care provider about which screenings and vaccines you need and how often you need them. This information is not intended to replace advice given to you by your health care provider. Make sure you discuss any questions you have with your health care provider. Document Released: 02/22/2015 Document Revised: 10/16/2015 Document Reviewed: 11/27/2014 Elsevier Interactive Patient Education  2017 [place] Prevention in the Home Falls can cause injuries. They can happen to people of all ages. There are many things you can do to make your home safe and to help prevent falls. What can I do on the outside of my home? Regularly fix the edges of walkways and driveways and fix any cracks. Remove anything that might make you trip as you walk through a door, such as a raised step or threshold. Trim any bushes or trees on the path to your home. Use bright outdoor lighting. Clear any walking paths of anything that might make someone trip, such as rocks or tools. Regularly check to see if handrails are loose or broken. Make sure that both sides of any steps have handrails. Any raised decks and porches should have guardrails on the edges. Have any leaves, snow, or ice cleared regularly. Use sand or salt on walking paths during winter. Clean up any spills in your garage right away. This includes oil or grease spills. What can I do in the bathroom? Use night lights. Install grab bars by the toilet and in the tub and shower. Do not use towel bars as grab bars. Use non-skid mats or decals in the tub or shower. If you need to sit down in the shower, use a plastic, non-slip stool. Keep the floor dry. Clean up any water that spills on the floor as soon as it happens. Remove soap buildup in the tub or shower regularly. Attach bath mats securely with double-sided non-slip rug tape. Do not have throw rugs and other things on the floor that can make you trip. What can I do in the bedroom? Use night  lights. Make sure that you have a light by your bed that is easy to reach. Do not use any sheets or blankets that are too big for your bed. They should not hang down onto the floor. Have a firm chair that has side arms. You can use this for support while you get dressed. Do not have throw rugs and other things on the floor that can make you trip. What can I do in the kitchen? Clean up any spills right away. Avoid walking on wet floors. Keep items that you use a lot in easy-to-reach places. If you need to reach something above you, use a strong step stool that has a grab bar. Keep electrical cords out of the way. Do not use floor polish or wax that makes floors slippery. If you must use wax, use non-skid floor wax. Do not have throw rugs and other things on the floor that can make you trip. What can I do with my stairs? Do not leave any items on the stairs. Make sure that there are handrails on both sides of the stairs and use them. Fix handrails that are broken or loose. Make sure that handrails are as long as the stairways. Check any carpeting to make sure that it is firmly attached to the stairs. Fix any carpet that is loose or worn. Avoid having throw rugs at the top or bottom of the stairs. If  you do have throw rugs, attach them to the floor with carpet tape. Make sure that you have a light switch at the top of the stairs and the bottom of the stairs. If you do not have them, ask someone to add them for you. What else can I do to help prevent falls? Wear shoes that: Do not have high heels. Have rubber bottoms. Are comfortable and fit you well. Are closed at the toe. Do not wear sandals. If you use a stepladder: Make sure that it is fully opened. Do not climb a closed stepladder. Make sure that both sides of the stepladder are locked into place. Ask someone to hold it for you, if possible. Clearly mark and make sure that you can see: Any grab bars or handrails. First and last  steps. Where the edge of each step is. Use tools that help you move around (mobility aids) if they are needed. These include: Canes. Walkers. Scooters. Crutches. Turn on the lights when you go into a dark area. Replace any light bulbs as soon as they burn out. Set up your furniture so you have a clear path. Avoid moving your furniture around. If any of your floors are uneven, fix them. If there are any pets around you, be aware of where they are. Review your medicines with your doctor. Some medicines can make you feel dizzy. This can increase your chance of falling. Ask your doctor what other things that you can do to help prevent falls. This information is not intended to replace advice given to you by your health care provider. Make sure you discuss any questions you have with your health care provider. Document Released: 11/22/2008 Document Revised: 07/04/2015 Document Reviewed: 03/02/2014 Elsevier Interactive Patient Education  2017 [employer].

## 2021-02-19 NOTE — Progress Notes (Signed)
Subjective:   Vanessa Zamora is a 81 y.o. female who presents for Medicare Annual (Subsequent) preventive examination.  Review of Systems     Cardiac Risk Factors include: advanced age (>30men, >60 women);diabetes mellitus;dyslipidemia;hypertension     Objective:    Today's Vitals   02/19/21 0846  BP: (!) 142/92  Pulse: 71  Resp: 16  Temp: 98.5 F (36.9 C)  TempSrc: Oral  SpO2: 99%  Weight: 120 lb 9.6 oz (54.7 kg)  Height: 5\' 5"  (1.651 m)   Body mass index is 20.07 kg/m.  Advanced Directives 02/19/2021 02/19/2020 02/15/2019 01/24/2018 01/21/2017 06/30/2016 12/06/2014  Does Patient Have a Medical Advance Directive? Yes Yes Yes No Yes Yes Yes  Type of Paramedic of [place];Living will [place];Living will [place];Living will - [place];Living will Living will [place]  Does patient want to make changes to medical advance directive? - - - Yes (MAU/Ambulatory/Procedural Areas - Information given) - - -  Copy of [place] in Chart? Yes - validated most recent copy scanned in chart (See row information) Yes - validated most recent copy scanned in chart (See row information) No - copy requested - No - copy requested - No - copy requested  Would patient like information on creating a medical advance directive? - - - - - - -    Current Medications (verified) Outpatient Encounter Medications as of 02/19/2021  Medication Sig   aspirin 81 MG tablet Take 1 tablet (81 mg total) by mouth daily.   Calcium Carbonate-Vit D-Min (CALTRATE 600+D PLUS MINERALS) 600-800 MG-UNIT TABS Take 1 tablet by mouth daily.   carvedilol (COREG) 3.125 MG tablet Take 1 tablet by mouth 2 (two) times daily. [place]   docusate sodium (COLACE) 100 MG capsule Take 100 mg by mouth daily as needed for mild constipation.   FEROSUL 325 (65 Fe) MG tablet TAKE 1 TABLET BY MOUTH EVERY OTHER DAY    hydrochlorothiazide (HYDRODIURIL) 25 MG tablet TAKE 1 TABLET(25 MG) BY MOUTH DAILY   metFORMIN (GLUCOPHAGE) 500 MG tablet TAKE 1 TABLET BY MOUTH TWICE DAILY   Multiple Vitamins-Minerals (CENTRUM SILVER ULTRA WOMENS) TABS Take 1 tablet by mouth daily.   olmesartan (BENICAR) 5 MG tablet TAKE 1 TABLET(5 MG) BY MOUTH DAILY   Omega-3 Fatty Acids (FISH OIL) 1000 MG CAPS Take by mouth.   ONETOUCH ULTRA test strip TEST ONCE DAILY   pravastatin (PRAVACHOL) 40 MG tablet TAKE 1 TABLET(40 MG) BY MOUTH DAILY   SYNTHROID 25 MCG tablet Take 1 tablet by mouth daily. Dr Truddie Coco   No facility-administered encounter medications on file as of 02/19/2021.    Allergies (verified) Ace inhibitors, Altace  [ramipril], Clonidine, Losartan potassium, and Levothyroxine   History: Past Medical History:  Diagnosis Date   Arthritis    shoulders, knees right side   Coronary artery disease    Diabetes mellitus without complication ([place])    Hypercholesteremia    Hypertension    Myocardial infarction ([place]) 2005   Seasonal allergies    Thyroid goiter    Wears dentures    partial lower   Past Surgical History:  Procedure Laterality Date   CARDIAC CATHETERIZATION  2005   CATARACT EXTRACTION Left 10/2014   CATARACT EXTRACTION W/PHACO Right 11/19/2014   Procedure: CATARACT EXTRACTION PHACO AND INTRAOCULAR LENS PLACEMENT ([place]);  Surgeon: Ronnell Freshwater, MD;  Location: [facility];  Service: Ophthalmology;  Laterality: Right;  DIABETIC -  oral meds   COLONOSCOPY WITH PROPOFOL N/A 06/30/2016   Procedure: COLONOSCOPY WITH PROPOFOL;  Surgeon: Lollie Sails, MD;  [facility];  Service: Endoscopy;  Laterality: N/A;   EYE SURGERY     VAGINAL HYSTERECTOMY  02/10/1980   Family History  Problem Relation Age of Onset   Stroke Mother    Aneurysm Sister    Heart disease Brother    Hypertension Brother    Goiter Maternal Grandmother    Diabetes Maternal Grandmother    COPD Brother     Hypertension Brother    Breast cancer Neg Hx    Social History   Socioeconomic History   Marital status: Single    Spouse name: Not on file   Number of children: 1   Years of education: Not on file   Highest education level: Not on file  Occupational History   Occupation: Retired  Tobacco Use   Smoking status: Former    Packs/day: 0.25    Years: 30.00    Pack years: 7.50    Types: Cigarettes    Quit date: 2003    Years since quitting: 20.0   Smokeless tobacco: Never   Tobacco comments:    quit 2003  Vaping Use   Vaping Use: Never used  Substance and Sexual Activity   Alcohol use: No    Alcohol/week: 0.0 standard drinks   Drug use: No   Sexual activity: Not Currently  Other Topics Concern   Not on file  Social History Narrative   Pt lives alone   Social Determinants of Health   Financial Resource Strain: Low Risk    Difficulty of Paying Living Expenses: Not hard at all  Food Insecurity: No Food Insecurity   Worried About Charity fundraiser in the Last Year: Never true   [place] in the Last Year: Never true  Transportation Needs: No Transportation Needs   Lack of Transportation (Medical): No   Lack of Transportation (Non-Medical): No  Physical Activity: Sufficiently Active   Days of Exercise per Week: 3 days   Minutes of Exercise per Session: 60 min  Stress: No Stress Concern Present   Feeling of Stress : Not at all  Social Connections: Moderately Integrated   Frequency of Communication with Friends and Family: More than three times a week   Frequency of Social Gatherings with Friends and Family: Once a week   Attends Religious Services: More than 4 times per year   Active Member of [employer] or Organizations: Yes   Attends Music therapist: More than 4 times per year   Marital Status: Divorced    Tobacco Counseling Counseling given: Not Answered Tobacco comments: quit 2003   Clinical Intake:  Pre-visit preparation completed:  Yes  Pain : No/denies pain     BMI - recorded: 20.07 Nutritional Status: BMI of 19-24  Normal Nutritional Risks: None Diabetes: Yes CBG done?: No Did pt. bring in CBG monitor from home?: No  How often do you need to have someone help you when you read instructions, pamphlets, or other written materials from your doctor or pharmacy?: 1 - Never  Nutrition Risk Assessment:  Has the patient had any N/V/D within the last 2 months?  No  Does the patient have any non-healing wounds?  No  Has the patient had any unintentional weight loss or weight gain?  No   Diabetes:  Is the patient diabetic?  Yes  If diabetic, was a CBG obtained today?  No  Did the patient bring in their glucometer from home?  No  How often do you monitor your CBG's? daily.   Financial Strains and Diabetes Management:  Are you having any financial strains with the device, your supplies or your medication? No .  Does the patient want to be seen by Chronic Care Management for management of their diabetes?  No  Would the patient like to be referred to a Nutritionist or for Diabetic Management?  No   Diabetic Exams:  Diabetic Eye Exam: Completed 11/26/20 negative retinopathy.   Diabetic Foot Exam: Completed 06/10/20.   Interpreter Needed?: No  Information entered by :: Clemetine Marker LPN   Activities of Daily Living In your present state of health, do you have any difficulty performing the following activities: 02/19/2021  Hearing? N  Vision? N  Difficulty concentrating or making decisions? N  Walking or climbing stairs? N  Dressing or bathing? N  Doing errands, shopping? N  Preparing Food and eating ? N  Using the Toilet? N  In the past six months, have you accidently leaked urine? Y  Do you have problems with loss of bowel control? N  Managing your Medications? N  Managing your Finances? N  Housekeeping or managing your Housekeeping? N  Some recent data might be hidden    Patient Care Team: Juline Patch, MD as PCP - General (Family Medicine) Ronnald Collum, Lourdes Sledge, MD as Consulting Physician (Endocrinology) Yolonda Kida, MD as Consulting Physician (Cardiology) Samara Deist, DPM as Referring Physician (Podiatry) Eulogio Bear, MD as Consulting Physician (Ophthalmology)  Indicate any recent Medical Services you may have received from other than [facility] providers in the past year (date may be approximate).     Assessment:   This is a routine wellness examination for [place].  Hearing/Vision screen Hearing Screening - Comments:: Pt denies hearing difficulty Vision Screening - Comments:: [facility] screen[facility]  Dietary issues and exercise activities discussed: Current Exercise Habits: Home exercise routine, Type of exercise: walking, Time (Minutes): 60, Frequency (Times/Week): 3, Weekly Exercise (Minutes/Week): 180, Intensity: Mild, Exercise limited by: None identified   Goals Addressed             This Visit's Progress    DIET - INCREASE WATER INTAKE   Not on track    Recommend 6-8 glasses of water per day       Depression Screen [facility] 2/9 Scores 02/19/2021 01/27/2021 12/17/2020 09/24/2020 02/19/2020 01/23/2020 09/13/2019  PHQ - 2 Score 0 0 0 0 0 0 0  PHQ- 9 Score 0 0 0 0 - 0 1    Fall Risk Fall Risk  02/19/2021 02/19/2020 01/23/2020 09/13/2019 05/11/2019  Falls in the past year? 0 0 0 0 0  Number falls in past yr: 0 0 - - -  Comment - - - - -  Injury with Fall? 0 0 - - -  Risk for fall due to : No Fall Risks No Fall Risks - No Fall Risks -  Follow up Falls prevention discussed Falls prevention discussed Falls evaluation completed Falls evaluation completed -    FALL RISK PREVENTION PERTAINING TO THE HOME:  Any stairs in or around the home? No  If so, are there any without handrails? No  Home free of loose throw rugs in walkways, pet beds, electrical cords, etc? Yes  Adequate lighting in your home to reduce risk of falls? Yes   ASSISTIVE  DEVICES UTILIZED TO PREVENT FALLS:  Life  alert? No  Use of a cane, walker or w/c? No  Grab bars in the bathroom? Yes  Shower chair or bench in shower? No  Elevated toilet seat or a handicapped toilet? Yes   TIMED UP AND GO:  Was the test performed? Yes .  Length of time to ambulate 10 feet: 5 sec.   Gait steady and fast without use of assistive device  Cognitive Function: Normal cognitive status assessed by direct observation by this Nurse Health Advisor. No abnormalities found.       6CIT Screen 02/19/2020 02/15/2019 01/24/2018 01/21/2017  What Year? 0 points 0 points 0 points 0 points  What month? 0 points 0 points 0 points 0 points  What time? 0 points 0 points 0 points 0 points  Count back from 20 0 points 0 points 0 points 0 points  Months in reverse 0 points 0 points 0 points 0 points  Repeat phrase 0 points 0 points 0 points 0 points  Total Score 0 0 0 0    Immunizations Immunization History  Administered Date(s) Administered   Fluad Quad(high Dose 65+) 11/09/2018, 11/14/2019, 11/14/2020   Influenza, High Dose Seasonal PF 11/24/2016, 11/12/2017   Influenza, Seasonal, Injecte, Preservative Fre 11/13/2010, 11/18/2011   Influenza,inj,Quad PF,6+ Mos 11/16/2012, 11/13/2013, 12/06/2014, 11/12/2015   Influenza-Unspecified 11/13/2013   PFIZER(Purple Top)SARS-COV-2 Vaccination 03/02/2019, 03/23/2019, 11/30/2019, 05/29/2020, 11/15/2020   Pneumococcal Conjugate-13 12/18/2014   Pneumococcal Polysaccharide-23 05/08/2010, 05/13/2012   Td 09/26/1987   Tdap 01/28/2016   Zoster Recombinat (Shingrix) 12/10/2017, 02/17/2018   Zoster, Live 01/09/2013    TDAP status: Up to date  Flu Vaccine status: Up to date  Pneumococcal vaccine status: Up to date  Covid-19 vaccine status: Completed vaccines  Qualifies for Shingles Vaccine? Yes   Zostavax completed Yes   Shingrix Completed?: Yes  Screening Tests Health Maintenance  Topic Date Due   COVID-19 Vaccine (6 - Booster for  [employer] series) 01/10/2021   FOOT EXAM  06/10/2021   HEMOGLOBIN A1C  07/28/2021   OPHTHALMOLOGY EXAM  11/26/2021   MAMMOGRAM  02/17/2022   TETANUS/TDAP  01/27/2026   Pneumonia Vaccine 54+ Years old  Completed   INFLUENZA VACCINE  Completed   DEXA SCAN  Completed   Zoster Vaccines- Shingrix  Completed   HPV VACCINES  Aged Out    Health Maintenance  Health Maintenance Due  Topic Date Due   COVID-19 Vaccine (6 - Booster for [place] series) 01/10/2021    Colorectal cancer screening: No longer required.   Mammogram status: Completed 02/17/21. Repeat every year  Bone Density status: Completed 03/16/18. Results reflect: Bone density results: NORMAL. Repeat every as directed years.  Lung Cancer Screening: (Low Dose CT Chest recommended if Age 37-80 years, 30 pack-year currently smoking OR have quit w/in 15years.) does not qualify.   Additional Screening:  Hepatitis C Screening: does not qualify  Vision Screening: Recommended annual ophthalmology exams for early detection of glaucoma and other disorders of the eye. Is the patient up to date with their annual eye exam?  Yes  Who is the provider or what is the name of the office in which the patient attends annual eye exams? [facility].   Dental Screening: Recommended annual dental exams for proper oral hygiene  Community Resource Referral / Chronic Care Management: CRR required this visit?  No   CCM required this visit?  No      Plan:     I have personally reviewed and noted the following in the patients chart:  Medical and social history Use of alcohol, tobacco or illicit drugs  Current medications and supplements including opioid prescriptions.  Functional ability and status Nutritional status Physical activity Advanced directives List of other physicians Hospitalizations, surgeries, and ER visits in previous 12 months Vitals Screenings to include cognitive, depression, and falls Referrals and  appointments  In addition, I have reviewed and discussed with patient certain preventive protocols, quality metrics, and best practice recommendations. A written personalized care plan for preventive services as well as general preventive health recommendations were provided to patient.     Clemetine Marker, LPN   [id number]   Nurse Notes: none

## 2021-02-24 LAB — HM DIABETES FOOT EXAM: HM Diabetic Foot Exam: NORMAL

## 2021-04-17 ENCOUNTER — Other Ambulatory Visit: Payer: Self-pay | Admitting: Family Medicine

## 2021-04-17 DIAGNOSIS — I10 Essential (primary) hypertension: Secondary | ICD-10-CM

## 2021-04-17 DIAGNOSIS — E119 Type 2 diabetes mellitus without complications: Secondary | ICD-10-CM

## 2021-04-17 NOTE — Telephone Encounter (Signed)
Requested Prescriptions  ?Pending Prescriptions Disp Refills  ?? hydrochlorothiazide (HYDRODIURIL) 25 MG tablet [Pharmacy Med Name: HYDROCHLOROTHIAZIDE '25MG'$  TABLETS] 90 tablet 0  ?  Sig: TAKE 1 TABLET(25 MG) BY MOUTH DAILY  ?  ? Cardiovascular: Diuretics - Thiazide Failed - 04/17/2021  3:33 AM  ?  ?  Failed - Cr in normal range and within 180 days  ?  Creatinine, Ser  ?Date Value Ref Range Status  ?05/22/2020 0.92 0.57 - 1.00 mg/dL Final  ?   ?  ?  Failed - K in normal range and within 180 days  ?  Potassium  ?Date Value Ref Range Status  ?05/22/2020 3.8 3.5 - 5.2 mmol/L Final  ?   ?  ?  Failed - Na in normal range and within 180 days  ?  Sodium  ?Date Value Ref Range Status  ?05/22/2020 143 134 - 144 mmol/L Final  ?   ?  ?  Failed - Last BP in normal range  ?  BP Readings from Last 1 Encounters:  ?02/19/21 (!) 142/92  ?   ?  ?  Passed - Valid encounter within last 6 months  ?  Recent Outpatient Visits   ?      ? 2 months ago Type 2 diabetes mellitus without complication, without long-term current use of insulin ([place])  ? [facility] Juline Patch, MD  ? 4 months ago COVID  ? [facility] Juline Patch, MD  ? 6 months ago Type 2 diabetes mellitus without complication, without long-term current use of insulin ([place])  ? [facility] Juline Patch, MD  ? 11 months ago Primary hypertension  ? [facility] Juline Patch, MD  ? 1 year ago Type 2 diabetes mellitus without complication, without long-term current use of insulin ([place])  ? [facility] Juline Patch, MD  ?  ?  ?Future Appointments   ?        ? In 1 month Juline Patch, MD [facility]  ?  ? ?  ?  ?  ?? olmesartan (BENICAR) 5 MG tablet [Pharmacy Med Name: OLMESARTAN MEDOXOMIL '5MG'$  TABLETS] 90 tablet 0  ?  Sig: TAKE 1 TABLET(5 MG) BY MOUTH DAILY  ?  ? Cardiovascular:  Angiotensin Receptor Blockers Failed - 04/17/2021  3:33 AM  ?  ?  Failed - Cr in normal range and within 180 days  ?  Creatinine, Ser   ?Date Value Ref Range Status  ?05/22/2020 0.92 0.57 - 1.00 mg/dL Final  ?   ?  ?  Failed - K in normal range and within 180 days  ?  Potassium  ?Date Value Ref Range Status  ?05/22/2020 3.8 3.5 - 5.2 mmol/L Final  ?   ?  ?  Failed - Last BP in normal range  ?  BP Readings from Last 1 Encounters:  ?02/19/21 (!) 142/92  ?   ?  ?  Passed - Patient is not pregnant  ?  ?  Passed - Valid encounter within last 6 months  ?  Recent Outpatient Visits   ?      ? 2 months ago Type 2 diabetes mellitus without complication, without long-term current use of insulin ([place])  ? [facility] Juline Patch, MD  ? 4 months ago COVID  ? [facility] Juline Patch, MD  ? 6 months ago Type 2 diabetes mellitus without complication, without long-term current use  of insulin ([place])  ? [facility] Juline Patch, MD  ? 11 months ago Primary hypertension  ? [facility] Juline Patch, MD  ? 1 year ago Type 2 diabetes mellitus without complication, without long-term current use of insulin ([place])  ? [facility] Juline Patch, MD  ?  ?  ?Future Appointments   ?        ? In 1 month Juline Patch, MD [facility] [place]  ?  ? ?  ?  ?  ? ? ?

## 2021-04-25 ENCOUNTER — Other Ambulatory Visit: Payer: Self-pay | Admitting: Family Medicine

## 2021-04-25 DIAGNOSIS — E119 Type 2 diabetes mellitus without complications: Secondary | ICD-10-CM

## 2021-04-25 NOTE — Telephone Encounter (Signed)
Requested Prescriptions  ?Pending Prescriptions Disp Refills  ?? ONETOUCH ULTRA test strip [Pharmacy Med Name: ONE TOUCH ULTRA BLUE TESTST(NEW)100] 100 strip 1  ?  Sig: TEST ONCE DAILY  ?  ? Endocrinology: Diabetes - Testing Supplies Passed - 04/25/2021  3:33 AM  ?  ?  Passed - Valid encounter within last 12 months  ?  Recent Outpatient Visits   ?      ? 2 months ago Type 2 diabetes mellitus without complication, without long-term current use of insulin ([place])  ? [facility] Juline Patch, MD  ? 4 months ago COVID  ? [facility] Juline Patch, MD  ? 7 months ago Type 2 diabetes mellitus without complication, without long-term current use of insulin ([place])  ? [facility] Juline Patch, MD  ? 11 months ago Primary hypertension  ? [facility] Juline [facility], [facility]  ? 1 year ago Type 2 diabetes mellitus without complication, without long-term current use of insulin ([place])  ? [facility] Juline Patch, MD  ?  ?  ?Future Appointments   ?        ? In 1 month Juline Patch, MD [facility] [place]  ?  ? ?  ?  ?  ? ? ?

## 2021-05-14 IMAGING — CT CT CHEST W/ CM
1 series · 16 of 34 positions shown, 20 images · IV contrast (omnipaque)
Comparison: January 10, 2019.

CLINICAL DATA: Abnormality on chest x-ray.

EXAM:
CT CHEST WITH CONTRAST
TECHNIQUE: Multidetector CT imaging of the chest was performed during
intravenous contrast administration.
CONTRAST:  75mL OMNIPAQUE IOHEXOL 300 MG/ML  SOLN

[Series 2: axial st · axial · 0.69mm/px · z∈[-618,-368]mm · 16 of 142 slices shown, 20 images]
[im 11/142  mediastinal]
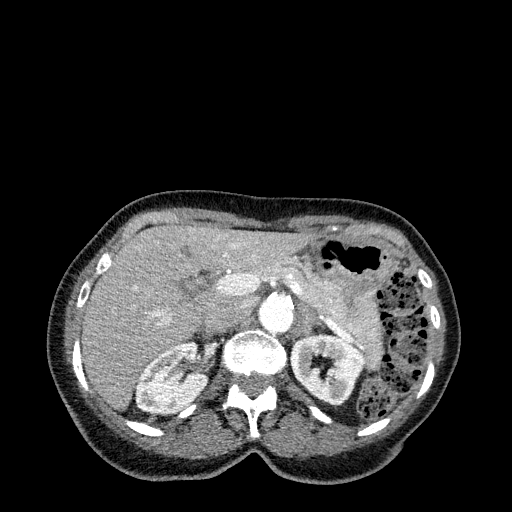
[im 11/142  lung]
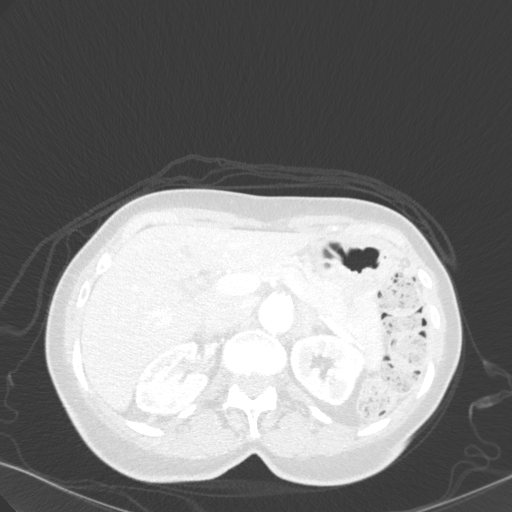
[im 21/142  lung]
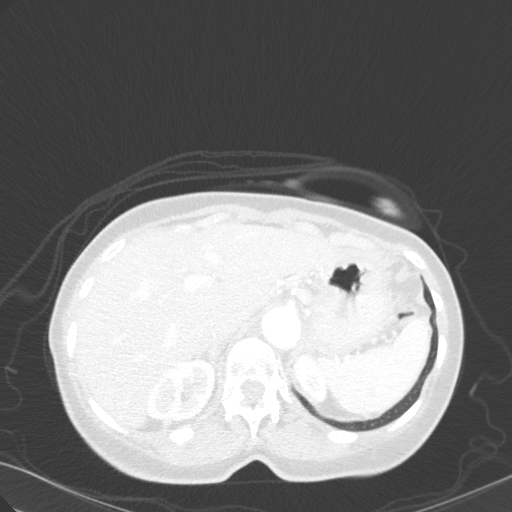
[im 29/142  lung]
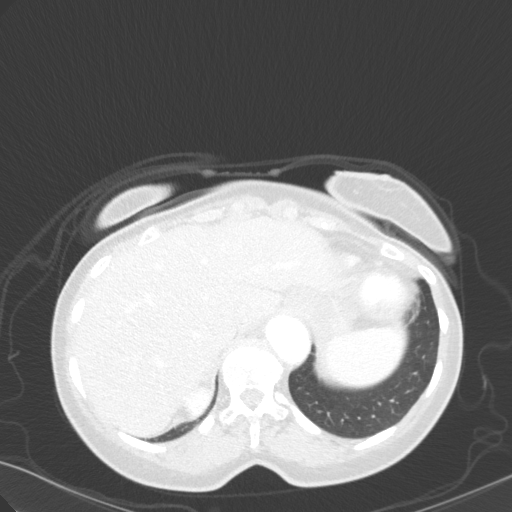
[im 37/142  lung]
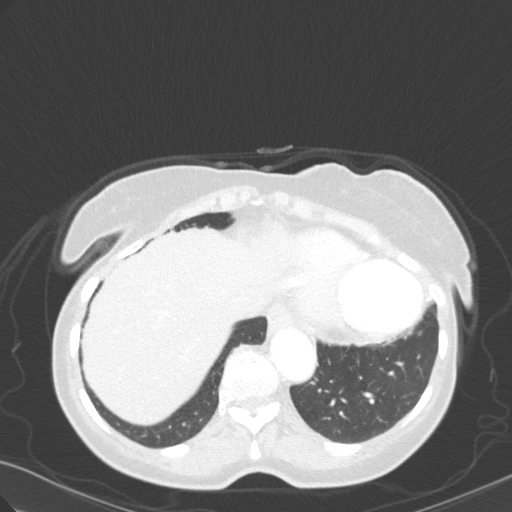
[im 48/142  mediastinal]
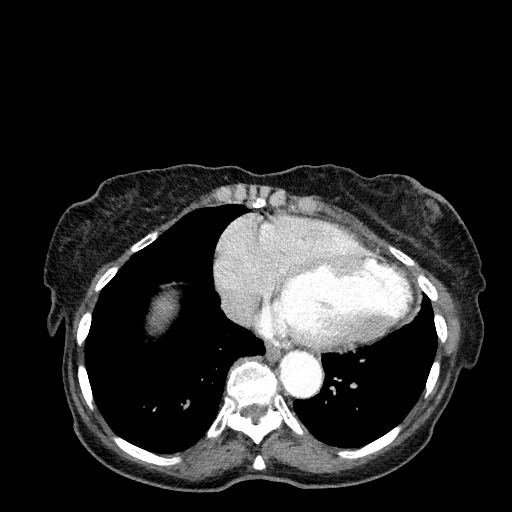
[im 48/142  lung]
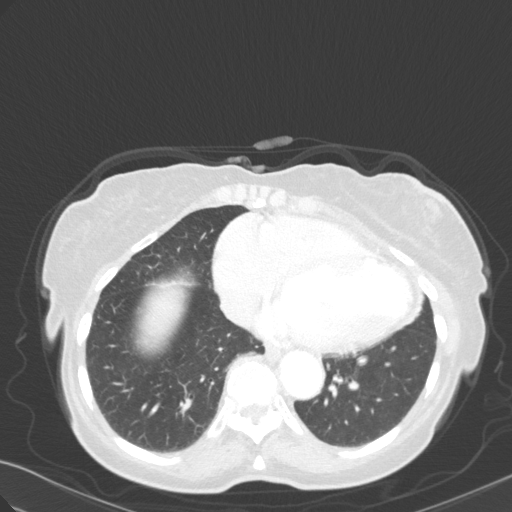
[im 57/142  lung]
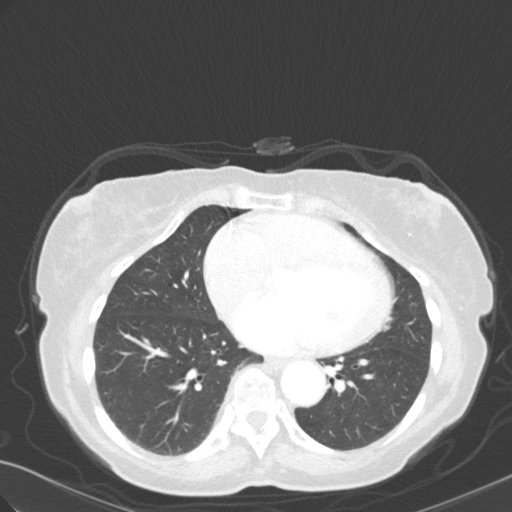
[im 63/142  lung]
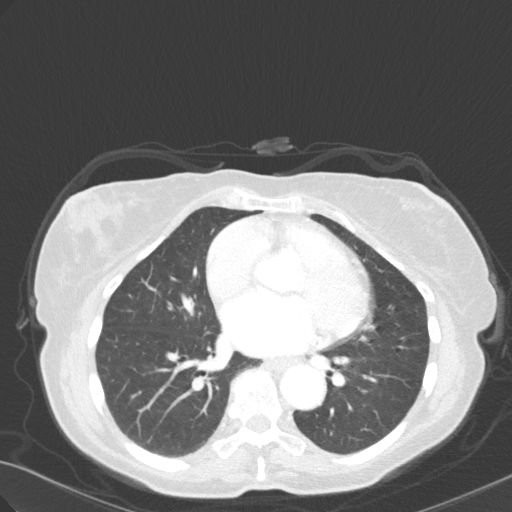
[im 68/142  lung]
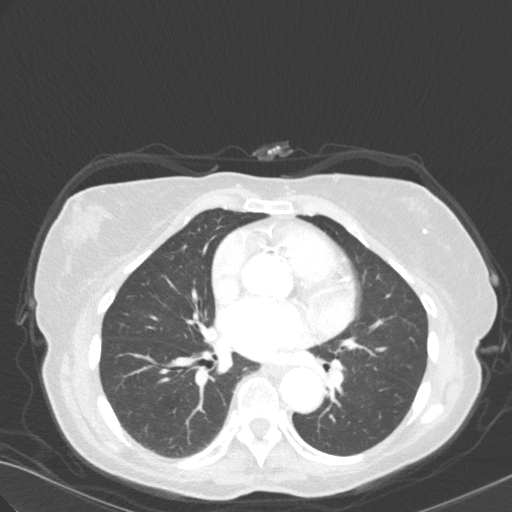
[im 76/142  mediastinal]
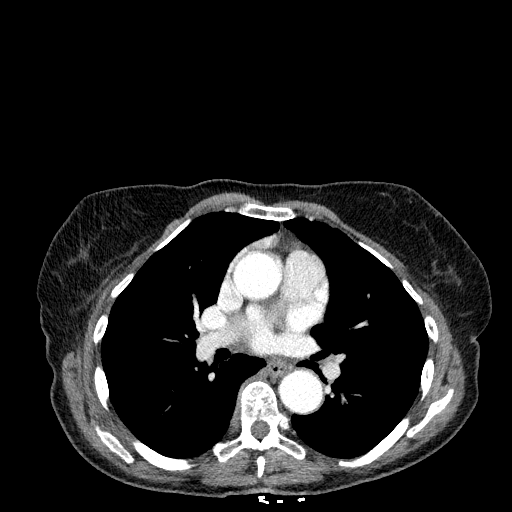
[im 76/142  lung]
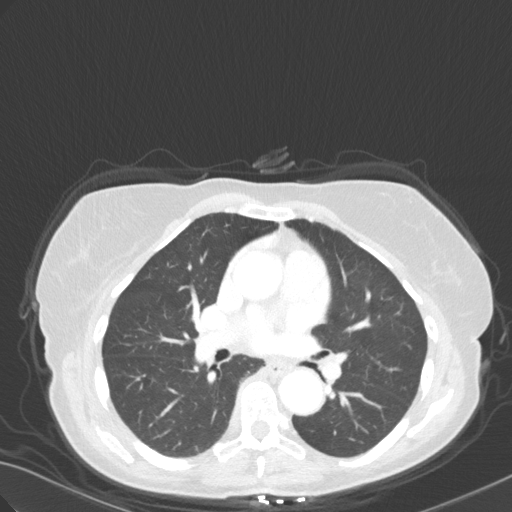
[im 84/142  lung]
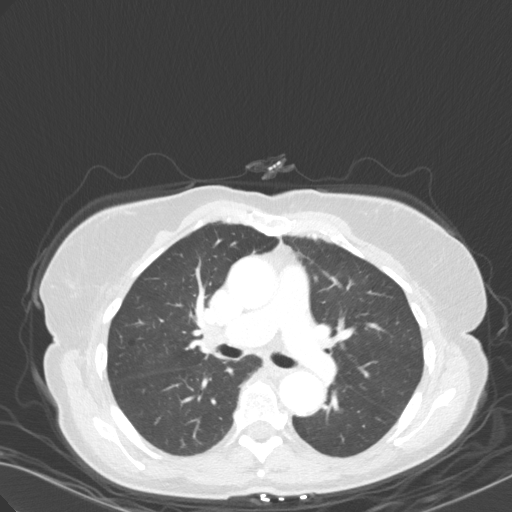
[im 89/142  lung]
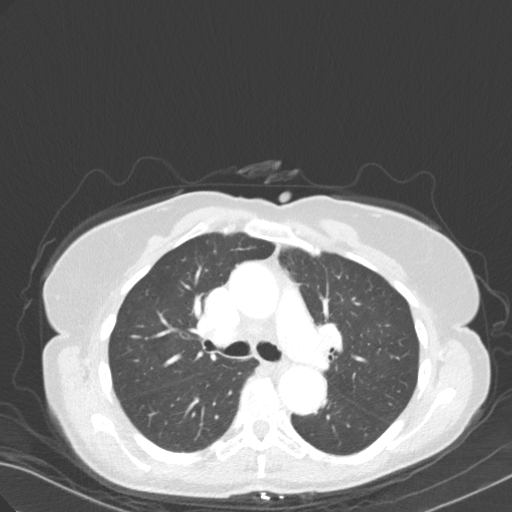
[im 100/142  lung]
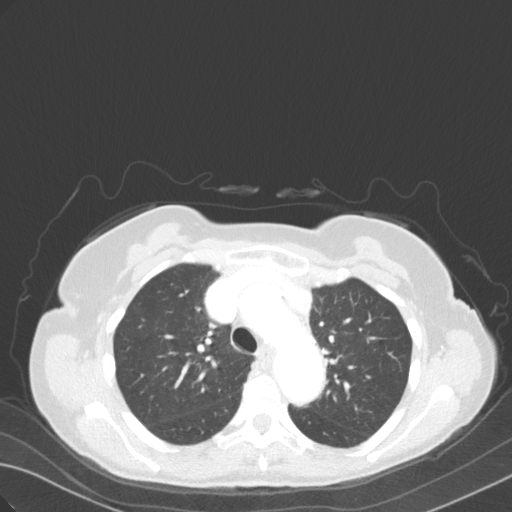
[im 110/142  mediastinal]
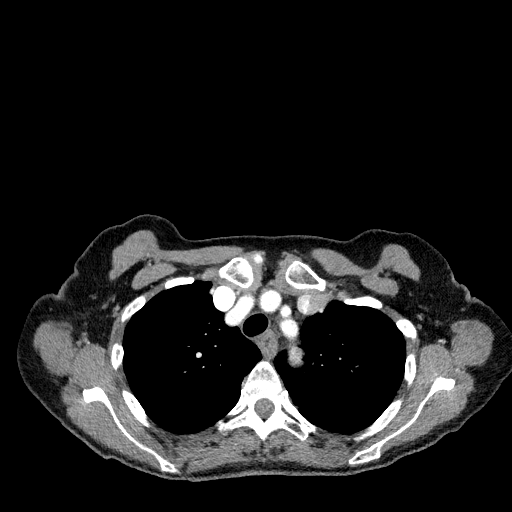
[im 110/142  lung]
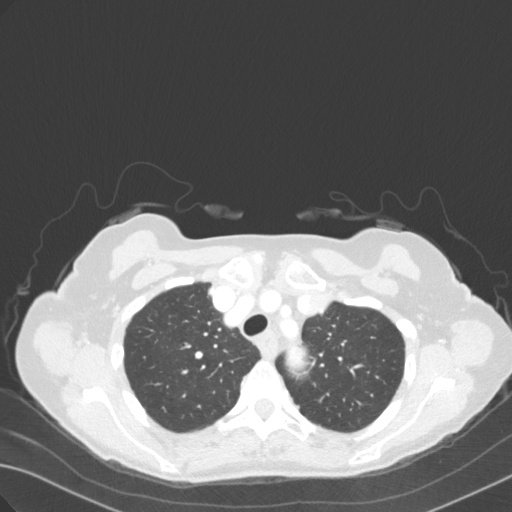
[im 115/142  lung]
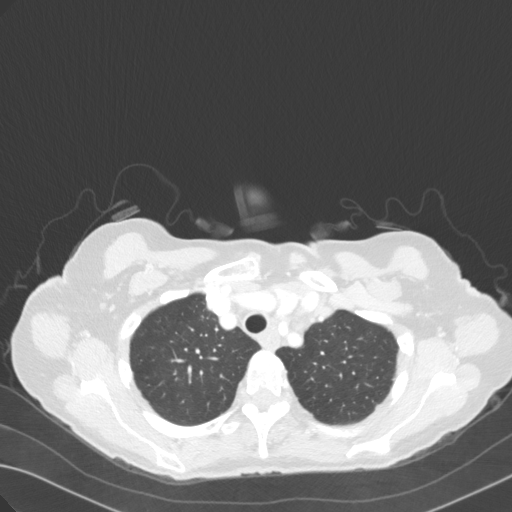
[im 126/142  lung]
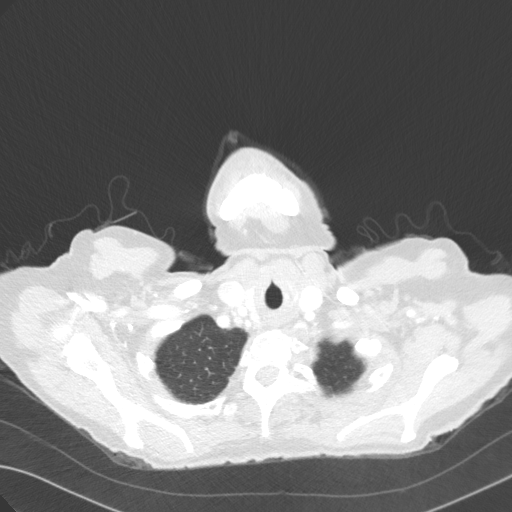
[im 136/142  lung]
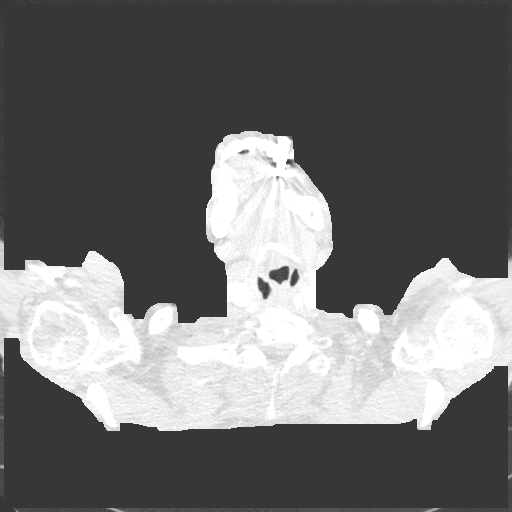

[16 of 34 positions shown; findings below may reference images not displayed]

FINDINGS: Cardiovascular: Atherosclerosis of thoracic aorta is noted without
aneurysm or dissection. Mild cardiomegaly is noted. No pericardial
effusion is noted. Coronary artery calcifications are noted.

Mediastinum/Nodes: No enlarged mediastinal, hilar, or axillary lymph
nodes. Thyroid gland, trachea, and esophagus demonstrate no
significant findings.

Lungs/Pleura: Lungs are clear. No pleural effusion or pneumothorax.

Upper Abdomen: No acute abnormality.

Musculoskeletal: No chest wall abnormality. No acute or significant
osseous findings.
IMPRESSION: 1. Atherosclerosis of thoracic aorta is noted without aneurysm or
dissection.
2. Coronary artery calcifications are noted suggesting coronary
artery disease.
3. No other abnormality seen in the chest.

## 2021-05-28 ENCOUNTER — Ambulatory Visit: Payer: Medicare HMO | Admitting: Family Medicine

## 2021-05-28 ENCOUNTER — Encounter: Payer: Self-pay | Admitting: Family Medicine

## 2021-05-28 VITALS — BP 128/80 | HR 80 | Ht 65.0 in | Wt 121.0 lb

## 2021-05-28 DIAGNOSIS — Z23 Encounter for immunization: Secondary | ICD-10-CM | POA: Diagnosis not present

## 2021-05-28 DIAGNOSIS — D509 Iron deficiency anemia, unspecified: Secondary | ICD-10-CM

## 2021-05-28 DIAGNOSIS — E119 Type 2 diabetes mellitus without complications: Secondary | ICD-10-CM | POA: Diagnosis not present

## 2021-05-28 DIAGNOSIS — E785 Hyperlipidemia, unspecified: Secondary | ICD-10-CM

## 2021-05-28 DIAGNOSIS — E7800 Pure hypercholesterolemia, unspecified: Secondary | ICD-10-CM

## 2021-05-28 DIAGNOSIS — I10 Essential (primary) hypertension: Secondary | ICD-10-CM | POA: Diagnosis not present

## 2021-05-28 MED ORDER — METFORMIN HCL 500 MG PO TABS: 500.0000 mg | ORAL_TABLET | Freq: Two times a day (BID) | ORAL | 1 refills | Status: DC

## 2021-05-28 MED ORDER — HYDROCHLOROTHIAZIDE 25 MG PO TABS: ORAL_TABLET | ORAL | 1 refills | Status: DC

## 2021-05-28 MED ORDER — FERROUS SULFATE 325 (65 FE) MG PO TABS: 325.0000 mg | ORAL_TABLET | ORAL | 1 refills | Status: DC

## 2021-05-28 MED ORDER — OLMESARTAN MEDOXOMIL 5 MG PO TABS: ORAL_TABLET | ORAL | 1 refills | Status: DC

## 2021-05-28 MED ORDER — PRAVASTATIN SODIUM 40 MG PO TABS: ORAL_TABLET | ORAL | 1 refills | Status: DC

## 2021-05-28 NOTE — Progress Notes (Signed)
? ? ?Date:  05/28/2021  ? ?Name:  Vanessa Zamora   DOB:  October 02, 1940   MRN:  [id number] ? ? ?Chief Complaint: Hypertension, Diabetes, Anemia, Hyperlipidemia, and pneumonia vacc need ? ?Hypertension ?This is a chronic problem. The current episode started more than 1 year ago. The problem has been gradually improving since onset. The problem is controlled. Pertinent negatives include no chest pain, headaches, malaise/fatigue, palpitations, PND or shortness of breath. There are no associated agents to hypertension. Risk factors for coronary artery disease include dyslipidemia. Past treatments include beta blockers, angiotensin blockers, alpha 1 blockers and diuretics. The current treatment provides moderate improvement. There are no compliance problems.  There is no history of angina, kidney disease, CAD/MI, CVA, heart failure, left ventricular hypertrophy, PVD or retinopathy. There is no history of chronic renal disease, a hypertension causing med or renovascular disease.  ?Diabetes ?She presents for her follow-up diabetic visit. She has type 2 diabetes mellitus. Her disease course has been fluctuating. There are no hypoglycemic associated symptoms. Pertinent negatives for hypoglycemia include no dizziness, headaches, nervousness/anxiousness or pallor. Pertinent negatives for diabetes include no chest pain, no fatigue, no polydipsia, no polyuria and no weakness. Symptoms are stable. There are no diabetic complications. Pertinent negatives for diabetic complications include no CVA, PVD or retinopathy. Current diabetic treatment includes oral agent (dual therapy). Her weight is stable. She is following a generally healthy diet. Her breakfast blood glucose is taken between 8-9 am. Her breakfast blood glucose range is generally 90-110 mg/dl. An ACE inhibitor/angiotensin II receptor blocker is being taken.  ?Anemia ?Presents for follow-up visit. There has been no abdominal pain, bruising/bleeding easily, fever,  light-headedness, malaise/fatigue, pallor or palpitations. Signs of blood loss that are not present include hematemesis, hematochezia, melena, menorrhagia and vaginal bleeding. There is no history of chronic renal disease or heart failure. There are no compliance problems.   ?Hyperlipidemia ?This is a chronic problem. The current episode started more than 1 year ago. The problem is controlled. Recent lipid tests were reviewed and are normal. Exacerbating diseases include diabetes. She has no history of chronic renal disease. Pertinent negatives include no chest pain, myalgias or shortness of breath. Current antihyperlipidemic treatment includes statins. The current treatment provides moderate improvement of lipids.  ? ?Lab Results  ?Component Value Date  ? NA 143 05/22/2020  ? K 3.8 05/22/2020  ? CO2 23 05/22/2020  ? GLUCOSE 88 05/22/2020  ? BUN 24 05/22/2020  ? CREATININE 0.92 05/22/2020  ? CALCIUM 10.1 05/22/2020  ? EGFR 63 05/22/2020  ? GFRNONAA 79 09/13/2019  ? ?Lab Results  ?Component Value Date  ? CHOL 195 05/22/2020  ? HDL 93 05/22/2020  ? [place] 92 05/22/2020  ? TRIG 53 05/22/2020  ? CHOLHDL 2.2 01/26/2018  ? ?Lab Results  ?Component Value Date  ? TSH 1.840 09/13/2019  ? ?Lab Results  ?Component Value Date  ? HGBA1C 6.6 (H) 01/27/2021  ? ?Lab Results  ?Component Value Date  ? WBC 4.6 05/22/2020  ? HGB 11.0 (L) 05/22/2020  ? HCT 35.8 05/22/2020  ? MCV 75 (L) 05/22/2020  ? PLT 247 05/22/2020  ? ?Lab Results  ?Component Value Date  ? ALT 14 05/22/2020  ? AST 19 05/22/2020  ? ALKPHOS 41 (L) 05/22/2020  ? BILITOT 0.4 05/22/2020  ? ?No results found for: 25OHVITD2, [place], VD25OH  ? ?Review of Systems  ?Constitutional: Negative.  Negative for chills, fatigue, fever, malaise/fatigue and unexpected weight change.  ?HENT:  Negative for congestion, ear discharge, ear  pain, rhinorrhea, sinus pressure, sneezing and sore throat.   ?Respiratory:  Negative for cough, shortness of breath, wheezing and stridor.    ?Cardiovascular:  Negative for chest pain, palpitations and PND.  ?Gastrointestinal:  Negative for abdominal pain, blood in stool, constipation, diarrhea, hematemesis, hematochezia, melena and nausea.  ?Endocrine: Negative for polydipsia and polyuria.  ?Genitourinary:  Negative for dysuria, flank pain, frequency, hematuria, menorrhagia, urgency, vaginal bleeding and vaginal discharge.  ?Musculoskeletal:  Negative for arthralgias, back pain and myalgias.  ?Skin:  Negative for pallor and rash.  ?Neurological:  Negative for dizziness, weakness, light-headedness and headaches.  ?Hematological:  Negative for adenopathy. Does not bruise/bleed easily.  ?Psychiatric/Behavioral:  Negative for dysphoric mood. The patient is not nervous/anxious.   ? ?Patient Active Problem List  ? Diagnosis Date Noted  ? H/O total hysterectomy 05/22/2020  ? Myocardial infarction ([place]) 01/24/2018  ? Coronary artery disease 01/24/2018  ? Diabetes ([place]) 01/24/2018  ? Heart murmur 01/24/2018  ? Anemia 12/19/2015  ? Osteoarthritis of left glenohumeral joint 06/20/2015  ? Type 2 diabetes mellitus without complication, without long-term current use of insulin ([place]) 06/07/2014  ? Hypercholesterolemia 06/07/2014  ? Dyslipidemia 06/07/2014  ? Hypertension 06/07/2014  ? 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 Routine general medical examination at a health care facility 06/07/2014  ? Screening for depression 06/07/2014  ? ? ?Allergies  ?Allergen Reactions  ? Ace Inhibitors Swelling  ? Altace  [Ramipril] Anaphylaxis  ? Clonidine Swelling  ? Losartan Potassium Other (See Comments)  ? Levothyroxine Rash  ? ? ?Past Surgical History:  ?Procedure Laterality Date  ? CARDIAC CATHETERIZATION  2005  ? CATARACT EXTRACTION Left 10/2014  ? CATARACT EXTRACTION W/PHACO Right 11/19/2014  ? Procedure: CATARACT EXTRACTION PHACO AND INTRAOCULAR LENS PLACEMENT (IOC);  Surgeon: Ronnell Freshwater, MD;  Location: [facility];  Service: Ophthalmology;  Laterality: Right;   DIABETIC - oral meds  ? COLONOSCOPY WITH PROPOFOL N/A 06/30/2016  ? Procedure: COLONOSCOPY WITH PROPOFOL;  Surgeon: Lollie Sails, MD;  Location: [facility] END[facility]COPY;  Service: Endoscopy;  Laterality: N/A;  ? EYE SURGERY    ? VAGINAL HYSTERECTOMY  02/10/1980  ? ? ?Social History  ? ?Tobacco Use  ? Smoking status: Former  ?  Packs/day: 0.25  ?  Years: 30.00  ?  Pack years: 7.50  ?  Types: Cigarettes  ?  Quit date: 2003  ?  Years since quitting: 20.3  ? Smokeless tobacco: Never  ? Tobacco comments:  ?  quit 2003  ?Vaping Use  ? Vaping Use: Never used  ?Substance Use Topics  ? Alcohol use: No  ?  Alcohol/week: 0.0 standard drinks  ? Drug use: No  ? ? ? ?Medication list has been reviewed and updated. ? ?Current Meds  ?Medication Sig  ? aspirin 81 MG tablet Take 1 tablet (81 mg total) by mouth daily.  ? Calcium Carbonate-Vit D-Min (CALTRATE 600+D PLUS MINERALS) 600-800 MG-UNIT TABS Take 1 tablet by mouth daily.  ? carvedilol (COREG) 3.125 MG tablet Take 1 tablet by mouth 2 (two) times daily. [place]  ? docusate sodium (COLACE) 100 MG capsule Take 100 mg by mouth daily as needed for mild constipation.  ? FEROSUL 325 (65 Fe) MG tablet TAKE 1 TABLET BY MOUTH EVERY OTHER DAY  ? hydrochlorothiazide (HYDRODIURIL) 25 MG tablet TAKE 1 TABLET(25 MG) BY MOUTH DAILY  ? metFORMIN (GLUCOPHAGE) 500 MG tablet TAKE 1 TABLET BY MOUTH TWICE DAILY  ? Multiple Vitamins-Minerals (CENTRUM SILVER ULTRA WOMENS) TABS Take 1 tablet by mouth daily.  ?  olmesartan (BENICAR) 5 MG tablet TAKE 1 TABLET(5 MG) BY MOUTH DAILY  ? Omega-3 Fatty Acids (FISH OIL) 1000 MG CAPS Take by mouth.  ? ONETOUCH ULTRA test strip TEST ONCE DAILY  ? pravastatin (PRAVACHOL) 40 MG tablet TAKE 1 TABLET(40 MG) BY MOUTH DAILY  ? SYNTHROID 25 MCG tablet Take 1 tablet by mouth daily. Dr Truddie Coco  ? ? ? ?  05/28/2021  ? 10:11 AM 05/28/2021  ? 10:05 AM 01/27/2021  ? 11:01 AM 12/17/2020  ? 11:10 AM  ?GAD 7 : Generalized Anxiety Score  ?Nervous, Anxious, on Edge 0 0 0 0  ?Control/stop  worrying 0 0 0 0  ?Worry too much - different things 0 0 0 0  ?Trouble relaxing 0 0 0 0  ?Restless 0 0 0 0  ?Easily annoyed or irritable 0 0 0 0  ?Afraid - awful might happen 0 0 0 0  ?Total GAD 7 Score 0 0 0 0  ?Anxiety D

## 2021-05-29 LAB — COMPREHENSIVE METABOLIC PANEL
ALT: 21 IU/L (ref 0–32)
AST: 22 IU/L (ref 0–40)
Albumin/Globulin Ratio: 1.8 (ref 1.2–2.2)
Albumin: 4.4 g/dL (ref 3.6–4.6)
Alkaline Phosphatase: 43 IU/L — ABNORMAL LOW (ref 44–121)
BUN/Creatinine Ratio: 21 (ref 12–28)
BUN: 17 mg/dL (ref 8–27)
Bilirubin Total: 0.4 mg/dL (ref 0.0–1.2)
CO2: 25 mmol/L (ref 20–29)
Calcium: 9.9 mg/dL (ref 8.7–10.3)
Chloride: 104 mmol/L (ref 96–106)
Creatinine, Ser: 0.8 mg/dL (ref 0.57–1.00)
Globulin, Total: 2.5 g/dL (ref 1.5–4.5)
Glucose: 91 mg/dL (ref 70–99)
Potassium: 4.1 mmol/L (ref 3.5–5.2)
Sodium: 143 mmol/L (ref 134–144)
Total Protein: 6.9 g/dL (ref 6.0–8.5)
eGFR: 74 mL/min/{1.73_m2} (ref >59)

## 2021-05-29 LAB — CBC WITH DIFFERENTIAL/PLATELET
Basophils Absolute: 0 10*3/uL (ref 0.0–0.2)
Basos: 1 %
EOS (ABSOLUTE): 0.1 10*3/uL (ref 0.0–0.4)
Eos: 2 %
Hematocrit: 36.4 % (ref 34.0–46.6)
Hemoglobin: 11.2 g/dL (ref 11.1–15.9)
Immature Grans (Abs): 0 10*3/uL (ref 0.0–0.1)
Immature Granulocytes: 0 %
Lymphocytes Absolute: 1.1 10*3/uL (ref 0.7–3.1)
Lymphs: 32 %
MCH: 23.6 pg — ABNORMAL LOW (ref 26.6–33.0)
MCHC: 30.8 g/dL — ABNORMAL LOW (ref 31.5–35.7)
MCV: 77 fL — ABNORMAL LOW (ref 79–97)
Monocytes Absolute: 0.5 10*3/uL (ref 0.1–0.9)
Monocytes: 13 %
Neutrophils Absolute: 1.8 10*3/uL (ref 1.4–7.0)
Neutrophils: 52 %
Platelets: 270 10*3/uL (ref 150–450)
RBC: 4.75 x10E6/uL (ref 3.77–5.28)
RDW: 15.5 % — ABNORMAL HIGH (ref 11.7–15.4)
WBC: 3.5 10*3/uL (ref 3.4–10.8)

## 2021-05-29 LAB — MICROALBUMIN, URINE: Microalbumin, Urine: 9.1 ug/mL

## 2021-05-29 LAB — LIPID PANEL WITH LDL/HDL RATIO
Cholesterol, Total: 187 mg/dL (ref 100–199)
HDL: 99 mg/dL (ref >39)
LDL Chol Calc (NIH): 79 mg/dL (ref 0–99)
LDL/HDL Ratio: 0.8 ratio (ref 0.0–3.2)
Triglycerides: 45 mg/dL (ref 0–149)
VLDL Cholesterol Cal: 9 mg/dL (ref 5–40)

## 2021-05-29 LAB — HEMOGLOBIN A1C
Est. average glucose Bld gHb Est-mCnc: 131 mg/dL
Hgb A1c MFr Bld: 6.2 % — ABNORMAL HIGH (ref 4.8–5.6)

## 2021-07-15 ENCOUNTER — Other Ambulatory Visit: Payer: Self-pay | Admitting: Family Medicine

## 2021-07-15 DIAGNOSIS — E119 Type 2 diabetes mellitus without complications: Secondary | ICD-10-CM

## 2021-07-15 DIAGNOSIS — I10 Essential (primary) hypertension: Secondary | ICD-10-CM

## 2021-07-21 ENCOUNTER — Other Ambulatory Visit: Payer: Self-pay | Admitting: Family Medicine

## 2021-07-21 DIAGNOSIS — E7800 Pure hypercholesterolemia, unspecified: Secondary | ICD-10-CM

## 2021-07-21 DIAGNOSIS — E785 Hyperlipidemia, unspecified: Secondary | ICD-10-CM

## 2021-09-29 ENCOUNTER — Encounter: Payer: Self-pay | Admitting: Family Medicine

## 2021-09-29 ENCOUNTER — Ambulatory Visit: Payer: Medicare HMO | Admitting: Family Medicine

## 2021-09-29 ENCOUNTER — Ambulatory Visit
Admission: RE | Admit: 2021-09-29 | Discharge: 2021-09-29 | Disposition: A | Discharge status: Home / Self Care | Payer: Medicare HMO | Attending: Family Medicine | Admitting: Family Medicine

## 2021-09-29 ENCOUNTER — Ambulatory Visit
Admission: RE | Admit: 2021-09-29 | Discharge: 2021-09-29 | Disposition: A | Discharge status: Home / Self Care | Payer: Medicare HMO | Source: Ambulatory Visit | Attending: Family Medicine | Admitting: Family Medicine

## 2021-09-29 VITALS — BP 118/78 | HR 72 | Ht 65.0 in | Wt 114.0 lb

## 2021-09-29 DIAGNOSIS — R634 Abnormal weight loss: Secondary | ICD-10-CM | POA: Diagnosis present

## 2021-09-29 DIAGNOSIS — I10 Essential (primary) hypertension: Secondary | ICD-10-CM | POA: Diagnosis not present

## 2021-09-29 DIAGNOSIS — E119 Type 2 diabetes mellitus without complications: Secondary | ICD-10-CM

## 2021-09-29 DIAGNOSIS — E7800 Pure hypercholesterolemia, unspecified: Secondary | ICD-10-CM | POA: Diagnosis not present

## 2021-09-29 DIAGNOSIS — E785 Hyperlipidemia, unspecified: Secondary | ICD-10-CM | POA: Diagnosis not present

## 2021-09-29 DIAGNOSIS — R9389 Abnormal findings on diagnostic imaging of other specified body structures: Secondary | ICD-10-CM

## 2021-09-29 LAB — CBC AND DIFFERENTIAL
HCT: 36 (ref 36–46)
Hemoglobin: 11.1 — AB (ref 12.0–16.0)
Platelets: 300 10*3/uL (ref 150–400)
WBC: 3.8

## 2021-09-29 LAB — CBC: RBC: 4.66 (ref 3.87–5.11)

## 2021-09-29 LAB — POCT ERYTHROCYTE SEDIMENTATION RATE, NON-AUTOMATED: Sed Rate: 14

## 2021-09-29 LAB — HEMOGLOBIN A1C: Hemoglobin A1C: 6.5

## 2021-09-29 MED ORDER — OLMESARTAN MEDOXOMIL 5 MG PO TABS: ORAL_TABLET | ORAL | 1 refills | Status: DC

## 2021-09-29 MED ORDER — PRAVASTATIN SODIUM 40 MG PO TABS: ORAL_TABLET | ORAL | 1 refills | Status: DC

## 2021-09-29 MED ORDER — HYDROCHLOROTHIAZIDE 25 MG PO TABS: ORAL_TABLET | ORAL | 1 refills | Status: DC

## 2021-09-29 MED ORDER — METFORMIN HCL 500 MG PO TABS: 500.0000 mg | ORAL_TABLET | Freq: Two times a day (BID) | ORAL | 1 refills | Status: DC

## 2021-09-29 NOTE — Progress Notes (Signed)
Date:  09/29/2021   Name:  Vanessa Zamora   DOB:  1940-03-21   MRN:  [id number]   Chief Complaint: Hyperlipidemia, Hypertension, and Prediabetes  Hyperlipidemia This is a chronic problem. The current episode started more than 1 year ago. The problem is controlled. Recent lipid tests were reviewed and are normal. Exacerbating diseases include diabetes. She has no history of chronic renal disease, hypothyroidism, liver disease, obesity or nephrotic syndrome. There are no known factors aggravating her hyperlipidemia. Pertinent negatives include no chest pain, focal sensory loss, focal weakness, leg pain, myalgias or shortness of breath. Current antihyperlipidemic treatment includes statins. The current treatment provides moderate improvement of lipids. There are no compliance problems.  Risk factors for coronary artery disease include diabetes mellitus.  Hypertension This is a chronic problem. The current episode started more than 1 year ago. The problem has been gradually improving since onset. The problem is controlled. Pertinent negatives include no anxiety, blurred vision, chest pain, headaches, malaise/fatigue, neck pain, orthopnea, palpitations, peripheral edema, PND, shortness of breath or sweats. There are no associated agents to hypertension. Risk factors for coronary artery disease include dyslipidemia. Past treatments include angiotensin blockers and diuretics. The current treatment provides moderate improvement. There are no compliance problems.  There is no history of angina, kidney disease, CAD/MI, CVA, heart failure, left ventricular hypertrophy, PVD or retinopathy. There is no history of chronic renal disease, a hypertension causing med or renovascular disease.  Diabetes Pertinent negatives for hypoglycemia include no confusion, dizziness, headaches, hunger, mood changes, nervousness/anxiousness, pallor, seizures, sleepiness, speech difficulty, sweats or tremors. Pertinent negatives for  diabetes include no blurred vision, no chest pain, no fatigue, no polydipsia and no polyuria. There are no hypoglycemic complications. Symptoms are stable. There are no diabetic complications. Pertinent negatives for diabetic complications include no CVA, PVD or retinopathy.    Lab Results  Component Value Date   NA 143 05/28/2021   K 4.1 05/28/2021   CO2 25 05/28/2021   GLUCOSE 91 05/28/2021   BUN 17 05/28/2021   CREATININE 0.80 05/28/2021   CALCIUM 9.9 05/28/2021   EGFR 74 05/28/2021   GFRNONAA 79 09/13/2019   Lab Results  Component Value Date   CHOL 187 05/28/2021   HDL 99 05/28/2021   LDLCALC 79 05/28/2021   TRIG 45 05/28/2021   CHOLHDL 2.2 01/26/2018   Lab Results  Component Value Date   TSH 1.840 09/13/2019   Lab Results  Component Value Date   HGBA1C 6.2 (H) 05/28/2021   Lab Results  Component Value Date   WBC 3.5 05/28/2021   HGB 11.2 05/28/2021   HCT 36.4 05/28/2021   MCV 77 (L) 05/28/2021   PLT 270 05/28/2021   Lab Results  Component Value Date   ALT 21 05/28/2021   AST 22 05/28/2021   ALKPHOS 43 (L) 05/28/2021   BILITOT 0.4 05/28/2021   No results found for: "25OHVITD2", "25OHVITD3", "VD25OH"   Review of Systems  Constitutional:  Positive for unexpected weight change. Negative for diaphoresis, fatigue and malaise/fatigue.  HENT:  Negative for trouble swallowing.   Eyes:  Negative for blurred vision.  Respiratory:  Negative for chest tightness and shortness of breath.   Cardiovascular:  Negative for chest pain, palpitations, orthopnea and PND.  Gastrointestinal:  Negative for abdominal pain and blood in stool.  Endocrine: Negative for polydipsia and polyuria.  Genitourinary:  Negative for menstrual problem.  Musculoskeletal:  Negative for myalgias and neck pain.  Skin:  Negative for pallor.  Neurological:  Negative for dizziness, tremors, focal weakness, seizures, speech difficulty and headaches.  Hematological:  Negative for adenopathy.   Psychiatric/Behavioral:  Negative for confusion. The patient is not nervous/anxious.     Patient Active Problem List   Diagnosis Date Noted   H/O total hysterectomy 05/22/2020   Myocardial infarction ([place]) 01/24/2018   Coronary artery disease 01/24/2018   Diabetes ([place]) 01/24/2018   Heart murmur 01/24/2018   Anemia 12/19/2015   Osteoarthritis of left glenohumeral joint 06/20/2015   Type 2 diabetes mellitus without complication, without long-term current use of insulin ([place]) 06/07/2014   Hypercholesterolemia 06/07/2014   Dyslipidemia 06/07/2014   Hypertension 06/07/2014   Thyrotoxicosis 06/07/2014   Routine general medical examination at a health care facility 06/07/2014   Screening for depression 06/07/2014    Allergies  Allergen Reactions   Ace Inhibitors Swelling   Altace  [Ramipril] Anaphylaxis   Clonidine Swelling   Losartan Potassium Other (See Comments)   Levothyroxine Rash    Past Surgical History:  Procedure Laterality Date   CARDIAC CATHETERIZATION  2005   CATARACT EXTRACTION Left 10/2014   CATARACT EXTRACTION W/PHACO Right 11/19/2014   Procedure: CATARACT EXTRACTION PHACO AND INTRAOCULAR LENS PLACEMENT ([place]);  Surgeon: Ronnell Freshwater, MD;  Location: [facility];  Service: Ophthalmology;  Laterality: Right;  DIABETIC - oral meds   COLONOSCOPY WITH PROPOFOL N/A 06/30/2016   Procedure: COLONOSCOPY WITH PROPOFOL;  Surgeon: Lollie Sails, MD;  Location: [facility] ENDOSCOPY;  Service: Endoscopy;  Laterality: N/A;   EYE SURGERY     VAGINAL HYSTERECTOMY  02/10/1980    Social History   Tobacco Use   Smoking status: Former    Packs/day: 0.25    Years: 30.00    Total pack years: 7.50    Types: Cigarettes    Quit date: 2003    Years since quitting: 20.6   Smokeless tobacco: Never   Tobacco comments:    quit 2003  Vaping Use   Vaping Use: Never used  Substance Use Topics   Alcohol use: No    Alcohol/week: 0.0 standard drinks of alcohol   Drug  use: No     Medication list has been reviewed and updated.  Current Meds  Medication Sig   aspirin 81 MG tablet Take 1 tablet (81 mg total) by mouth daily.   Calcium Carbonate-Vit D-Min (CALTRATE 600+D PLUS MINERALS) 600-800 MG-UNIT TABS Take 1 tablet by mouth daily.   carvedilol (COREG) 3.125 MG tablet Take 1 tablet by mouth 2 (two) times daily. [place]   docusate sodium (COLACE) 100 MG capsule Take 100 mg by mouth daily as needed for mild constipation.   ferrous sulfate (FEROSUL) 325 (65 FE) MG tablet Take 1 tablet (325 mg total) by mouth every other day.   hydrochlorothiazide (HYDRODIURIL) 25 MG tablet TAKE 1 TABLET(25 MG) BY MOUTH DAILY   metFORMIN (GLUCOPHAGE) 500 MG tablet TAKE 1 TABLET BY MOUTH TWICE DAILY   Multiple Vitamins-Minerals (CENTRUM SILVER ULTRA WOMENS) TABS Take 1 tablet by mouth daily.   olmesartan (BENICAR) 5 MG tablet TAKE 1 TABLET(5 MG) BY MOUTH DAILY   Omega-3 Fatty Acids (FISH OIL) 1000 MG CAPS Take by mouth.   ONETOUCH ULTRA test strip TEST ONCE DAILY   pravastatin (PRAVACHOL) 40 MG tablet TAKE 1 TABLET(40 MG) BY MOUTH DAILY   SYNTHROID 25 MCG tablet Take 1 tablet by mouth daily. Dr Truddie Coco       09/29/2021    9:33 AM 05/28/2021   10:11 AM 05/28/2021   10:05 AM  01/27/2021   11:01 AM  GAD 7 : Generalized Anxiety Score  Nervous, Anxious, on Edge 0 0 0 0  Control/stop worrying 0 0 0 0  Worry too much - different things 0 0 0 0  Trouble relaxing 0 0 0 0  Restless 0 0 0 0  Easily annoyed or irritable 0 0 0 0  Afraid - awful might happen 0 0 0 0  Total GAD 7 Score 0 0 0 0  Anxiety Difficulty Not difficult at all Not difficult at all Not difficult at all        09/29/2021    9:33 AM 05/28/2021   10:11 AM 05/28/2021   10:05 AM  Depression screen PHQ 2/9  Decreased Interest 0 2 0  Down, Depressed, Hopeless 0 3 0  PHQ - 2 Score 0 5 0  Altered sleeping 0 0 0  Tired, decreased energy 0 0 0  Change in appetite 0 0 0  Feeling bad or failure about yourself   0 0 0  Trouble concentrating 0 0 0  Moving slowly or fidgety/restless 0 0 0  Suicidal thoughts 0 0 0  PHQ-9 Score 0 5 0  Difficult doing work/chores Not difficult at all Somewhat difficult Not difficult at all    BP Readings from Last 3 Encounters:  09/29/21 118/78  05/28/21 128/80  02/19/21 (!) 142/92    Physical Exam Vitals and nursing note reviewed. Exam conducted with a chaperone present.  Constitutional:      General: She is not in acute distress.    Appearance: She is not diaphoretic.  HENT:     Head: Normocephalic and atraumatic.     Right Ear: External ear normal.     Left Ear: External ear normal.     Nose: Nose normal.  Eyes:     General:        Right eye: No discharge.        Left eye: No discharge.     Conjunctiva/sclera: Conjunctivae normal.     Pupils: Pupils are equal, round, and reactive to light.  Neck:     Thyroid: No thyromegaly.     Vascular: No JVD.  Cardiovascular:     Rate and Rhythm: Normal rate and regular rhythm.     Heart sounds: Normal heart sounds. No murmur heard.    No friction rub. No gallop.  Pulmonary:     Effort: Pulmonary effort is normal.     Breath sounds: Normal breath sounds.  Abdominal:     General: Bowel sounds are normal.     Palpations: Abdomen is soft. There is no mass.     Tenderness: There is no abdominal tenderness. There is no guarding.  Musculoskeletal:        General: Normal range of motion.     Cervical back: Normal range of motion and neck supple.  Lymphadenopathy:     Cervical: No cervical adenopathy.  Skin:    General: Skin is warm and dry.  Neurological:     Mental Status: She is alert.     Deep Tendon Reflexes: Reflexes are normal and symmetric.     Wt Readings from Last 3 Encounters:  09/29/21 114 lb (51.7 kg)  05/28/21 121 lb (54.9 kg)  02/19/21 120 lb 9.6 oz (54.7 kg)    BP 118/78   Pulse 72   Ht '5\' 5"'  (1.651 m)   Wt 114 lb (51.7 kg)   BMI 18.97 kg/m   Assessment and Plan: 1. Essential  (  primary) hypertension .  Controlled.  Stable.  Continue hydrochlorothiazide 25 mg once a day.  Check renal function panel for electrolytes and GFR. - hydrochlorothiazide (HYDRODIURIL) 25 MG tablet; Take 1 tablet by mouth daily  Dispense: 90 tablet; Refill: 1 - Renal Function Panel  2. Type 2 diabetes mellitus without complication, without long-term current use of insulin (HCC) Chronic.  Controlled.  Stable.  Twice a day.  Will check A1c for current diabetic control. - metFORMIN (GLUCOPHAGE) 500 MG tablet; Take 1 tablet (500 mg total) by mouth 2 (two) times daily.  Dispense: 180 tablet; Refill: 1 - olmesartan (BENICAR) 5 MG tablet; Take 1 tablet by mouth daily  Dispense: 90 tablet; Refill: 1 - HgB A1c  3. Hypercholesteremia Controlled.  Stable.  Continue pravastatin 40 mg once a day. - pravastatin (PRAVACHOL) 40 MG tablet; Take 1 tablet by mouth daily  Dispense: 90 tablet; Refill: 1  4. Dyslipidemia Chronic.  Controlled.  Stable.  Continue noted above. - pravastatin (PRAVACHOL) 40 MG tablet; Take 1 tablet by mouth daily  Dispense: 90 tablet; Refill: 1  5. Weight loss Gradually has been losing weight over the past couple years but most importantly since 18 months ago there has been a 13 to 14 pound weight loss without trying.  There is been no abdominal pain no cough shortness of breath.  Patient has a remote history of smoking on review there was noted a chest x-ray that was abnormal but CT scan was unremarkable.  CBC has notes significant anemia with lower limits of normal but microcytic but GI is felt that it is would not be necessary to do a colonoscopy we will further check with CBC to see if this is decreased more in the anemic range as well as check her hepatic function sed rate and A1c.  We will also repeat a chest x-ray that had been couple years since her CT of the chest.  To see if there is been any other abnormality other considerations as patient still has ovaries that we may need to  do a CT of the abdomen and pelvis. - DG Chest 2 View; Future - CBC with Differential/Platelet - Hepatic Function Panel (6) - Sedimentation rate - Hemoglobin A1c  6. Abnormal chest x-ray As noted above previous was a overlapping skin.  Was at first thought but we will recheck a chest x-ray to be on the safer side to see if there is any issues and reviewed the chest CT was unremarkable. - DG Chest 2 View; Future     Otilio Miu, MD

## 2021-09-30 LAB — CBC WITH DIFFERENTIAL/PLATELET
Basophils Absolute: 0 10*3/uL (ref 0.0–0.2)
Basos: 0 %
EOS (ABSOLUTE): 0 10*3/uL (ref 0.0–0.4)
Eos: 1 %
Hematocrit: 36.4 % (ref 34.0–46.6)
Hemoglobin: 11.1 g/dL (ref 11.1–15.9)
Immature Grans (Abs): 0 10*3/uL (ref 0.0–0.1)
Immature Granulocytes: 0 %
Lymphocytes Absolute: 1.3 10*3/uL (ref 0.7–3.1)
Lymphs: 35 %
MCH: 23.8 pg — ABNORMAL LOW (ref 26.6–33.0)
MCHC: 30.5 g/dL — ABNORMAL LOW (ref 31.5–35.7)
MCV: 78 fL — ABNORMAL LOW (ref 79–97)
Monocytes Absolute: 0.6 10*3/uL (ref 0.1–0.9)
Monocytes: 15 %
Neutrophils Absolute: 1.9 10*3/uL (ref 1.4–7.0)
Neutrophils: 49 %
Platelets: 300 10*3/uL (ref 150–450)
RBC: 4.66 x10E6/uL (ref 3.77–5.28)
RDW: 15.1 % (ref 11.7–15.4)
WBC: 3.8 10*3/uL (ref 3.4–10.8)

## 2021-09-30 LAB — HEPATIC FUNCTION PANEL (6)
ALT: 13 IU/L (ref 0–32)
AST: 17 IU/L (ref 0–40)
Alkaline Phosphatase: 51 IU/L (ref 44–121)
Bilirubin Total: 0.3 mg/dL (ref 0.0–1.2)
Bilirubin, Direct: <0.1 mg/dL (ref 0.00–0.40)

## 2021-09-30 LAB — RENAL FUNCTION PANEL
Albumin: 4.2 g/dL (ref 3.7–4.7)
BUN/Creatinine Ratio: 25 (ref 12–28)
BUN: 21 mg/dL (ref 8–27)
CO2: 24 mmol/L (ref 20–29)
Calcium: 9.6 mg/dL (ref 8.7–10.3)
Chloride: 102 mmol/L (ref 96–106)
Creatinine, Ser: 0.83 mg/dL (ref 0.57–1.00)
Glucose: 93 mg/dL (ref 70–99)
Phosphorus: 3.8 mg/dL (ref 3.0–4.3)
Potassium: 4.2 mmol/L (ref 3.5–5.2)
Sodium: 140 mmol/L (ref 134–144)
eGFR: 70 mL/min/{1.73_m2} (ref >59)

## 2021-09-30 LAB — HEMOGLOBIN A1C
Est. average glucose Bld gHb Est-mCnc: 140 mg/dL
Hgb A1c MFr Bld: 6.5 % — ABNORMAL HIGH (ref 4.8–5.6)

## 2021-09-30 LAB — SEDIMENTATION RATE: Sed Rate: 14 mm/hr (ref 0–40)

## 2021-10-14 ENCOUNTER — Ambulatory Visit (INDEPENDENT_AMBULATORY_CARE_PROVIDER_SITE_OTHER): Payer: Medicare HMO | Admitting: Family Medicine

## 2021-10-14 ENCOUNTER — Encounter: Payer: Self-pay | Admitting: Family Medicine

## 2021-10-14 VITALS — BP 120/80 | HR 68 | Ht 65.0 in | Wt 117.8 lb

## 2021-10-14 DIAGNOSIS — E0590 Thyrotoxicosis, unspecified without thyrotoxic crisis or storm: Secondary | ICD-10-CM

## 2021-10-14 DIAGNOSIS — E119 Type 2 diabetes mellitus without complications: Secondary | ICD-10-CM

## 2021-10-14 DIAGNOSIS — R634 Abnormal weight loss: Secondary | ICD-10-CM

## 2021-10-14 NOTE — Progress Notes (Signed)
Date:  10/14/2021   Name:  Vanessa Zamora   DOB:  [birth year]   MRN:  [id number]   Chief Complaint: Weight Loss (Gained 3.8 pounds)  Diabetes She presents for her follow-up diabetic visit. She has type 2 diabetes mellitus. Her disease course has been stable. There are no hypoglycemic associated symptoms. Pertinent negatives for hypoglycemia include no dizziness, headaches or nervousness/anxiousness. There are no diabetic associated symptoms. Pertinent negatives for diabetes include no chest pain and no polydipsia. There are no hypoglycemic complications. Symptoms are stable. There are no diabetic complications. Current diabetic treatment includes oral agent (monotherapy). She is compliant with treatment most of the time. Her weight is increasing steadily. Meal planning includes avoidance of concentrated sweets and carbohydrate counting. She participates in exercise daily (walking 2500 steps/day). Her breakfast blood glucose is taken between 8-9 am. Her breakfast blood glucose range is generally 110-130 mg/dl. An ACE inhibitor/angiotensin II receptor blocker is being taken.  Hypertension This is a chronic problem. The current episode started more than 1 year ago. The problem has been gradually improving since onset. The problem is controlled. Pertinent negatives include no chest pain, headaches, neck pain, palpitations or shortness of breath. Past treatments include angiotensin blockers and diuretics. The current treatment provides moderate improvement. There are no compliance problems.  Identifiable causes of hypertension include a thyroid problem.  Thyroid Problem Presents for follow-up visit. Symptoms include weight gain. Patient reports no anxiety, constipation, depressed mood, diarrhea, dry skin, leg swelling or palpitations.    Lab Results  Component Value Date   NA 140 09/29/2021   K 4.2 09/29/2021   CO2 24 09/29/2021   GLUCOSE 93 09/29/2021   BUN 21 09/29/2021   CREATININE 0.83  09/29/2021   CALCIUM 9.6 09/29/2021   EGFR 70 09/29/2021   GFRNONAA 79 09/13/2019   Lab Results  Component Value Date   CHOL 187 05/28/2021   HDL 99 05/28/2021   LDLCALC 79 05/28/2021   TRIG 45 05/28/2021   CHOLHDL 2.2 01/26/2018   Lab Results  Component Value Date   TSH 1.840 09/13/2019   Lab Results  Component Value Date   HGBA1C 6.5 (H) 09/29/2021   Lab Results  Component Value Date   WBC 3.8 09/29/2021   HGB 11.1 09/29/2021   HCT 36.4 09/29/2021   MCV 78 (L) 09/29/2021   PLT 300 09/29/2021   Lab Results  Component Value Date   ALT 13 09/29/2021   AST 17 09/29/2021   ALKPHOS 51 09/29/2021   BILITOT 0.3 09/29/2021   No results found for: "25OHVITD2", "25OHVITD3", "VD25OH"   Review of Systems  Constitutional:  Positive for weight gain. Negative for chills and fever.  HENT:  Negative for drooling, ear discharge, ear pain and sore throat.   Respiratory:  Negative for cough, shortness of breath and wheezing.   Cardiovascular:  Negative for chest pain, palpitations and leg swelling.  Gastrointestinal:  Negative for abdominal pain, blood in stool, constipation, diarrhea and nausea.  Endocrine: Negative for polydipsia.  Genitourinary:  Negative for dysuria, frequency, hematuria and urgency.  Musculoskeletal:  Negative for back pain, myalgias and neck pain.  Skin:  Negative for rash.  Allergic/Immunologic: Negative for environmental allergies.  Neurological:  Negative for dizziness and headaches.  Hematological:  Does not bruise/bleed easily.  Psychiatric/Behavioral:  Negative for suicidal ideas. The patient is not nervous/anxious.     Patient Active Problem List   Diagnosis Date Noted   H/O total hysterectomy 05/22/2020   Myocardial infarction (  [place]) 01/24/2018   Coronary artery disease 01/24/2018   Diabetes ([place]) 01/24/2018   Heart murmur 01/24/2018   Anemia 12/19/2015   Osteoarthritis of left glenohumeral joint 06/20/2015   Type 2 diabetes mellitus without  complication, without long-term current use of insulin ([place]) 06/07/2014   Hypercholesterolemia 06/07/2014   Dyslipidemia 06/07/2014   Hypertension 06/07/2014   Thyrotoxicosis 06/07/2014   Routine general medical examination at a health care facility 06/07/2014   Screening for depression 06/07/2014    Allergies  Allergen Reactions   Ace Inhibitors Swelling   Altace  [Ramipril] Anaphylaxis   Clonidine Swelling   Losartan Potassium Other (See Comments)   Levothyroxine Rash    Past Surgical History:  Procedure Laterality Date   CARDIAC CATHETERIZATION  2005   CATARACT EXTRACTION Left 10/2014   CATARACT EXTRACTION W/PHACO Right 11/19/2014   Procedure: CATARACT EXTRACTION PHACO AND INTRAOCULAR LENS PLACEMENT ([place]);  Surgeon: Ronnell Freshwater, MD;  Location: [facility];  Service: Ophthalmology;  Laterality: Right;  DIABETIC - oral meds   COLONOSCOPY WITH PROPOFOL N/A 06/30/2016   Procedure: COLONOSCOPY WITH PROPOFOL;  Surgeon: Lollie Sails, MD;  Location: [facility] ENDOSCOPY;  Service: Endoscopy;  Laterality: N/A;   EYE SURGERY     VAGINAL HYSTERECTOMY  02/10/1980    Social History   Tobacco Use   Smoking status: Former    Packs/day: 0.25    Years: 30.00    Total pack years: 7.50    Types: Cigarettes    Quit date: 2003    Years since quitting: 20.6   Smokeless tobacco: Never   Tobacco comments:    quit 2003  Vaping Use   Vaping Use: Never used  Substance Use Topics   Alcohol use: No    Alcohol/week: 0.0 standard drinks of alcohol   Drug use: No     Medication list has been reviewed and updated.  Current Meds  Medication Sig   aspirin 81 MG tablet Take 1 tablet (81 mg total) by mouth daily.   Calcium Carbonate-Vit D-Min (CALTRATE 600+D PLUS MINERALS) 600-800 MG-UNIT TABS Take 1 tablet by mouth daily.   carvedilol (COREG) 6.25 MG tablet Take 1 tablet by mouth 2 (two) times daily. [place]   docusate sodium (COLACE) 100 MG capsule Take 100 mg by mouth  daily as needed for mild constipation.   ferrous sulfate (FEROSUL) 325 (65 FE) MG tablet Take 1 tablet (325 mg total) by mouth every other day.   hydrochlorothiazide (HYDRODIURIL) 25 MG tablet Take 1 tablet by mouth daily   metFORMIN (GLUCOPHAGE) 500 MG tablet Take 1 tablet (500 mg total) by mouth 2 (two) times daily.   Multiple Vitamins-Minerals (CENTRUM SILVER ULTRA WOMENS) TABS Take 1 tablet by mouth daily.   olmesartan (BENICAR) 5 MG tablet Take 1 tablet by mouth daily   Omega-3 Fatty Acids (FISH OIL) 1000 MG CAPS Take by mouth.   ONETOUCH ULTRA test strip TEST ONCE DAILY   pravastatin (PRAVACHOL) 40 MG tablet Take 1 tablet by mouth daily   SYNTHROID 25 MCG tablet Take 1 tablet by mouth daily. Dr Truddie Coco       10/14/2021    1:27 PM 09/29/2021    9:33 AM 05/28/2021   10:11 AM 05/28/2021   10:05 AM  GAD 7 : Generalized Anxiety Score  Nervous, Anxious, on Edge 0 0 0 0  Control/stop worrying 0 0 0 0  Worry too much - different things 0 0 0 0  Trouble relaxing 0 0 0 0  Restless  0 0 0 0  Easily annoyed or irritable 0 0 0 0  Afraid - awful might happen 0 0 0 0  Total GAD 7 Score 0 0 0 0  Anxiety Difficulty Not difficult at all Not difficult at all Not difficult at all Not difficult at all       10/14/2021    1:27 PM 09/29/2021    9:33 AM 05/28/2021   10:11 AM  Depression screen PHQ 2/9  Decreased Interest 0 0 2  Down, Depressed, Hopeless 0 0 3  PHQ - 2 Score 0 0 5  Altered sleeping 0 0 0  Tired, decreased energy 0 0 0  Change in appetite 0 0 0  Feeling bad or failure about yourself  0 0 0  Trouble concentrating 0 0 0  Moving slowly or fidgety/restless 0 0 0  Suicidal thoughts 0 0 0  PHQ-9 Score 0 0 5  Difficult doing work/chores Not difficult at all Not difficult at all Somewhat difficult    BP Readings from Last 3 Encounters:  10/14/21 120/80  09/29/21 118/78  05/28/21 128/80    Physical Exam Vitals and nursing note reviewed.  Constitutional:      Appearance: She is  well-developed.  HENT:     Head: Normocephalic.     Right Ear: External ear normal.     Left Ear: External ear normal.     Mouth/Throat:     Mouth: Mucous membranes are moist.  Eyes:     General: Lids are everted, no foreign bodies appreciated. No scleral icterus.       Left eye: No foreign body or hordeolum.     Conjunctiva/sclera: Conjunctivae normal.     Right eye: Right conjunctiva is not injected.     Left eye: Left conjunctiva is not injected.     Pupils: Pupils are equal, round, and reactive to light.  Neck:     Thyroid: No thyroid mass, thyromegaly or thyroid tenderness.     Vascular: No JVD.     Trachea: No tracheal deviation.  Cardiovascular:     Rate and Rhythm: Normal rate and regular rhythm.     Heart sounds: Normal heart sounds. No murmur heard.    No friction rub. No gallop.  Pulmonary:     Effort: Pulmonary effort is normal. No respiratory distress.     Breath sounds: Normal breath sounds. No wheezing, rhonchi or rales.  Abdominal:     General: Bowel sounds are normal.     Palpations: Abdomen is soft. There is no mass.     Tenderness: There is no abdominal tenderness. There is no guarding or rebound.  Musculoskeletal:        General: No tenderness. Normal range of motion.     Cervical back: Normal range of motion and neck supple.  Lymphadenopathy:     Cervical: No cervical adenopathy.  Skin:    General: Skin is warm.     Findings: No rash.  Neurological:     Mental Status: She is alert and oriented to person, place, and time.     Cranial Nerves: No cranial nerve deficit.     Deep Tendon Reflexes: Reflexes normal.  Psychiatric:        Mood and Affect: Mood is not anxious or depressed.     Wt Readings from Last 3 Encounters:  10/14/21 117 lb 12.8 oz (53.4 kg)  09/29/21 114 lb (51.7 kg)  05/28/21 121 lb (54.9 kg)    BP 120/80  Pulse 68   Ht '5\' 5"'  (1.651 m)   Wt 117 lb 12.8 oz (53.4 kg)   SpO2 98%   BMI 19.60 kg/m   Assessment and Plan:  1.  Weight loss New onset.  Stabilizing.  Controlled.  Patient is actually has gained 3.8 pounds from previous exam.  Patient is doing well with Ensure and tolerating well.  Patient is delighted that she has had some weight gain and would like to gain some work to get back in some previous clothing.  Continue with gradual dietary gain.  2. Type 2 diabetes mellitus without complication, without long-term current use of insulin (HCC) Chronic.  Controlled.  Stable.  Continue on metformin is doing well with current dosing.  Today's fasting blood sugar was 111 mg/dL.  3. Thyrotoxicosis without thyroid storm, unspecified thyrotoxicosis type Chronic.  Controlled.  Stable.  Followed by Dr. Francoise Schaumann.  Patient has an upcoming appointment next month and will proceed accordingly.   Otilio Miu, MD

## 2021-11-08 ENCOUNTER — Other Ambulatory Visit: Payer: Self-pay | Admitting: Family Medicine

## 2021-11-08 DIAGNOSIS — E119 Type 2 diabetes mellitus without complications: Secondary | ICD-10-CM

## 2021-11-10 NOTE — Telephone Encounter (Signed)
Requested Prescriptions  Pending Prescriptions Disp Refills  . glucose blood (ONETOUCH ULTRA) test strip [Pharmacy Med Name: [facility] TESTST(NEW)100] 100 strip 1    Sig: USE TO TEST ONCE DAILY AS DIRECTED     Endocrinology: Diabetes - Testing Supplies Passed - 11/08/2021  3:34 AM      Passed - Valid encounter within last 12 months    Recent Outpatient Visits          3 weeks ago Weight loss   [facility] [place], Deanna C, MD   1 month ago Essential (primary) hypertension   [facility] [facility] at [facility], MD   5 months ago Type 2 diabetes mellitus without complication, without long-term current use of insulin ([place])   [facility] [facility] at [facility], MD   9 months ago Type 2 diabetes mellitus without complication, without long-term current use of insulin ([place])   [place] [facility] [facility] at [place], Deanna C, MD   10 months ago [place] [facility] at [facility], MD      Future Appointments            In 2 months Juline Patch, MD [facility] at [facility], [place]   In 2 months Juline Patch, MD [place] and [facility] Medicine at [facility], [facility]

## 2021-11-24 ENCOUNTER — Ambulatory Visit (INDEPENDENT_AMBULATORY_CARE_PROVIDER_SITE_OTHER): Payer: Medicare HMO

## 2021-11-24 DIAGNOSIS — Z23 Encounter for immunization: Secondary | ICD-10-CM

## 2021-11-28 ENCOUNTER — Other Ambulatory Visit: Payer: Self-pay | Admitting: Family Medicine

## 2021-11-28 LAB — HM DIABETES EYE EXAM

## 2021-12-01 NOTE — Telephone Encounter (Signed)
Requested medication (s) are due for refill today: unclear, historical provider, no details available  Requested medication (s) are on the active medication list: yes  Last refill:  10/23/2020, no details  Future visit scheduled: 01/13/22, seen 10/11/21  Notes to clinic:  Historical Provider, please assess.       Requested Prescriptions  Pending Prescriptions Disp Refills   carvedilol (COREG) 6.25 MG tablet [Pharmacy Med Name: CARVEDILOL 6.'25MG'$  TABLETS] 180 tablet     Sig: TAKE 1 TABLET(6.25 MG) BY MOUTH TWICE DAILY     Cardiovascular: Beta Blockers 3 Passed - 11/28/2021  5:18 PM      Passed - Cr in normal range and within 360 days    Creatinine, Ser  Date Value Ref Range Status  09/29/2021 0.83 0.57 - 1.00 mg/dL Final         Passed - AST in normal range and within 360 days    AST  Date Value Ref Range Status  09/29/2021 17 0 - 40 IU/L Final         Passed - ALT in normal range and within 360 days    ALT  Date Value Ref Range Status  09/29/2021 13 0 - 32 IU/L Final         Passed - Last BP in normal range    BP Readings from Last 1 Encounters:  10/14/21 120/80         Passed - Last Heart Rate in normal range    Pulse Readings from Last 1 Encounters:  10/14/21 68         Passed - Valid encounter within last 6 months    Recent Outpatient Visits           1 month ago Weight loss   [facility], Deanna C, MD   2 months ago Essential (primary) hypertension   [place] [facility] at [facility], MD   6 months ago Type 2 diabetes mellitus without complication, without long-term current use of insulin ([place])   [place] [facility] at [facility], MD   10 months ago Type 2 diabetes mellitus without complication, without long-term current use of insulin ([place])   [place] [facility] [place], Deanna C, MD   11 months ago [place] [facility] at [facility], MD       Future Appointments             In 1 month Juline Patch, MD [facility] at [facility], [place]   In 1 month Juline Patch, MD [facility] at [facility], [facility]

## 2022-01-13 ENCOUNTER — Ambulatory Visit: Payer: Medicare HMO | Admitting: Family Medicine

## 2022-01-13 ENCOUNTER — Encounter: Payer: Self-pay | Admitting: Family Medicine

## 2022-01-13 VITALS — BP 128/79 | HR 62 | Ht 65.0 in | Wt 119.0 lb

## 2022-01-13 DIAGNOSIS — E119 Type 2 diabetes mellitus without complications: Secondary | ICD-10-CM | POA: Diagnosis not present

## 2022-01-13 DIAGNOSIS — R634 Abnormal weight loss: Secondary | ICD-10-CM | POA: Diagnosis not present

## 2022-01-13 NOTE — Progress Notes (Signed)
Date:  01/13/2022   Name:  Vanessa Zamora   DOB:  1940-07-11   MRN:  [id number]   Chief Complaint: Weight Check (Was 117 on Sept 5- has gained almost 2 pounds since then)  Diabetes She presents for her follow-up diabetic visit. She has type 2 diabetes mellitus. Her disease course has been stable. There are no hypoglycemic associated symptoms. Pertinent negatives for diabetes include no blurred vision, no chest pain, no fatigue, no foot paresthesias, no foot ulcerations, no polydipsia, no polyphagia, no polyuria, no visual change, no weakness and no weight loss. There are no hypoglycemic complications. Symptoms are improving. There are no diabetic complications. There are no known risk factors for coronary artery disease. Current diabetic treatment includes oral agent (monotherapy). She is compliant with treatment most of the time. Her weight is stable. She is following a generally healthy diet. Meal planning includes avoidance of concentrated sweets and carbohydrate counting. Her breakfast blood glucose is taken between 8-9 am. Her breakfast blood glucose range is generally 90-110 mg/dl. (96 mg%) An ACE inhibitor/angiotensin II receptor blocker is being taken.    Lab Results  Component Value Date   NA 140 09/29/2021   K 4.2 09/29/2021   CO2 24 09/29/2021   GLUCOSE 93 09/29/2021   BUN 21 09/29/2021   CREATININE 0.83 09/29/2021   CALCIUM 9.6 09/29/2021   EGFR 70 09/29/2021   GFRNONAA 79 09/13/2019   Lab Results  Component Value Date   CHOL 187 05/28/2021   HDL 99 05/28/2021   LDLCALC 79 05/28/2021   TRIG 45 05/28/2021   CHOLHDL 2.2 01/26/2018   Lab Results  Component Value Date   TSH 1.840 09/13/2019   Lab Results  Component Value Date   HGBA1C 6.5 (H) 09/29/2021   Lab Results  Component Value Date   WBC 3.8 09/29/2021   HGB 11.1 09/29/2021   HCT 36.4 09/29/2021   MCV 78 (L) 09/29/2021   PLT 300 09/29/2021   Lab Results  Component Value Date   ALT 13 09/29/2021    AST 17 09/29/2021   ALKPHOS 51 09/29/2021   BILITOT 0.3 09/29/2021   No results found for: "25OHVITD2", "25OHVITD3", "VD25OH"   Review of Systems  Constitutional:  Negative for fatigue, unexpected weight change and weight loss.  Eyes:  Negative for blurred vision.  Respiratory:  Negative for cough, chest tightness and shortness of breath.   Cardiovascular:  Negative for chest pain and palpitations.  Endocrine: Negative for polydipsia, polyphagia and polyuria.  Neurological:  Negative for weakness.    Patient Active Problem List   Diagnosis Date Noted   H/O total hysterectomy 05/22/2020   Myocardial infarction ([place]) 01/24/2018   Coronary artery disease 01/24/2018   Diabetes ([place]) 01/24/2018   Heart murmur 01/24/2018   Anemia 12/19/2015   Osteoarthritis of left glenohumeral joint 06/20/2015   Type 2 diabetes mellitus without complication, without long-term current use of insulin ([place]) 06/07/2014   Hypercholesterolemia 06/07/2014   Dyslipidemia 06/07/2014   Hypertension 06/07/2014   Thyrotoxicosis 06/07/2014   Routine general medical examination at a health care facility 06/07/2014   Screening for depression 06/07/2014    Allergies  Allergen Reactions   Ace Inhibitors Swelling   Altace  [Ramipril] Anaphylaxis   Clonidine Swelling   Losartan Potassium Other (See Comments)   Levothyroxine Rash    Past Surgical History:  Procedure Laterality Date   CARDIAC CATHETERIZATION  2005   CATARACT EXTRACTION Left 10/2014   CATARACT EXTRACTION W/PHACO Right 11/19/2014  Procedure: CATARACT EXTRACTION PHACO AND INTRAOCULAR LENS PLACEMENT (IOC);  Surgeon: Ronnell Freshwater, MD;  Location: [facility];  Service: Ophthalmology;  Laterality: Right;  DIABETIC - oral meds   COLONOSCOPY WITH PROPOFOL N/A 06/30/2016   Procedure: COLONOSCOPY WITH PROPOFOL;  Surgeon: Lollie Sails, MD;  Location: [facility] ENDOSCOPY;  Service: Endoscopy;  Laterality: N/A;   EYE SURGERY      VAGINAL HYSTERECTOMY  02/10/1980    Social History   Tobacco Use   Smoking status: Former    Packs/day: 0.25    Years: 30.00    Total pack years: 7.50    Types: Cigarettes    Quit date: 2003    Years since quitting: 20.9   Smokeless tobacco: Never   Tobacco comments:    quit 2003  Vaping Use   Vaping Use: Never used  Substance Use Topics   Alcohol use: No    Alcohol/week: 0.0 standard drinks of alcohol   Drug use: No     Medication list has been reviewed and updated.  Current Meds  Medication Sig   aspirin 81 MG tablet Take 1 tablet (81 mg total) by mouth daily.   Calcium Carbonate-Vit D-Min (CALTRATE 600+D PLUS MINERALS) 600-800 MG-UNIT TABS Take 1 tablet by mouth daily.   carvedilol (COREG) 6.25 MG tablet Take 1 tablet by mouth 2 (two) times daily. callwood   docusate sodium (COLACE) 100 MG capsule Take 100 mg by mouth daily as needed for mild constipation.   ferrous sulfate (FEROSUL) 325 (65 FE) MG tablet Take 1 tablet (325 mg total) by mouth every other day.   glucose blood (ONETOUCH ULTRA) test strip USE TO TEST ONCE DAILY AS DIRECTED   hydrochlorothiazide (HYDRODIURIL) 25 MG tablet Take 1 tablet by mouth daily   metFORMIN (GLUCOPHAGE) 500 MG tablet Take 1 tablet (500 mg total) by mouth 2 (two) times daily.   Multiple Vitamins-Minerals (CENTRUM SILVER ULTRA WOMENS) TABS Take 1 tablet by mouth daily.   olmesartan (BENICAR) 5 MG tablet Take 1 tablet by mouth daily   Omega-3 Fatty Acids (FISH OIL) 1000 MG CAPS Take by mouth.   pravastatin (PRAVACHOL) 40 MG tablet Take 1 tablet by mouth daily   SYNTHROID 25 MCG tablet Take 1 tablet by mouth daily. Dr Truddie Coco       01/13/2022    3:01 PM 10/14/2021    1:27 PM 09/29/2021    9:33 AM 05/28/2021   10:11 AM  GAD 7 : Generalized Anxiety Score  Nervous, Anxious, on Edge 0 0 0 0  Control/stop worrying 0 0 0 0  Worry too much - different things 0 0 0 0  Trouble relaxing 0 0 0 0  Restless 0 0 0 0  Easily annoyed or irritable 0  0 0 0  Afraid - awful might happen 0 0 0 0  Total GAD 7 Score 0 0 0 0  Anxiety Difficulty Not difficult at all Not difficult at all Not difficult at all Not difficult at all       01/13/2022    3:01 PM 10/14/2021    1:27 PM 09/29/2021    9:33 AM  Depression screen PHQ 2/9  Decreased Interest 0 0 0  Down, Depressed, Hopeless 0 0 0  PHQ - 2 Score 0 0 0  Altered sleeping 0 0 0  Tired, decreased energy 0 0 0  Change in appetite 0 0 0  Feeling bad or failure about yourself  0 0 0  Trouble concentrating 0 0  0  Moving slowly or fidgety/restless 0 0 0  Suicidal thoughts 0 0 0  PHQ-9 Score 0 0 0  Difficult doing work/chores Not difficult at all Not difficult at all Not difficult at all    BP Readings from Last 3 Encounters:  01/13/22 128/79  10/14/21 120/80  09/29/21 118/78    Physical Exam Vitals and nursing note reviewed.  Constitutional:      Appearance: She is well-developed.  HENT:     Head: Normocephalic.     Right Ear: External ear normal.     Left Ear: External ear normal.  Eyes:     General: Lids are everted, no foreign bodies appreciated. No scleral icterus.       Left eye: No foreign body or hordeolum.     Conjunctiva/sclera: Conjunctivae normal.     Right eye: Right conjunctiva is not injected.     Left eye: Left conjunctiva is not injected.     Pupils: Pupils are equal, round, and reactive to light.  Neck:     Thyroid: No thyromegaly.     Vascular: No JVD.     Trachea: No tracheal deviation.  Cardiovascular:     Rate and Rhythm: Normal rate and regular rhythm.     Heart sounds: Normal heart sounds. No murmur heard.    No friction rub. No gallop.  Pulmonary:     Effort: Pulmonary effort is normal. No respiratory distress.     Breath sounds: Normal breath sounds. No wheezing or rales.  Abdominal:     Palpations: There is no mass.     Tenderness: There is no abdominal tenderness. There is no guarding or rebound.  Musculoskeletal:        General: No  tenderness. Normal range of motion.     Cervical back: Normal range of motion and neck supple.  Lymphadenopathy:     Cervical: No cervical adenopathy.  Skin:    General: Skin is warm.     Findings: No rash.  Neurological:     Mental Status: She is alert.  Psychiatric:        Mood and Affect: Mood is not anxious or depressed.     Wt Readings from Last 3 Encounters:  01/13/22 119 lb (54 kg)  10/14/21 117 lb 12.8 oz (53.4 kg)  09/29/21 114 lb (51.7 kg)    BP 128/79   Pulse 62   Ht _0  (1.651 m)   Wt 119 lb (54 kg)   SpO2 97%   BMI 19.80 kg/m   Assessment and Plan:  1. Type 2 diabetes mellitus without complication, without long-term current use of insulin (HCC) Chronic.  Controlled.  Stable.  Patient is doing well with dietary discretion and current medications including metformin.  Weight is at least maintained and that she has gained a pound a and a few ounces.  I have encouraged her to continue to eat wisely but not to skip any meals.  We will recheck patient for her diabetic and medication refills later this month.  2. Weight loss Weight went from 17 then 12 ounces to 19 pounds patient is doing well at current dosing of medication.  We will continue to monitor weight on a regular basis.   Otilio Miu, MD

## 2022-01-29 ENCOUNTER — Ambulatory Visit (INDEPENDENT_AMBULATORY_CARE_PROVIDER_SITE_OTHER): Payer: Medicare HMO | Admitting: Family Medicine

## 2022-01-29 ENCOUNTER — Encounter: Payer: Self-pay | Admitting: Family Medicine

## 2022-01-29 VITALS — BP 128/78 | HR 72 | Ht 65.0 in | Wt 120.0 lb

## 2022-01-29 DIAGNOSIS — E119 Type 2 diabetes mellitus without complications: Secondary | ICD-10-CM | POA: Diagnosis not present

## 2022-01-29 LAB — HM DIABETES FOOT EXAM: HM Diabetic Foot Exam: NORMAL

## 2022-01-29 NOTE — Progress Notes (Signed)
Date:  01/29/2022   Name:  Vanessa Zamora   DOB:  [birth year]   MRN:  [id number]   Chief Complaint: Diabetes  Diabetes She presents for her follow-up diabetic visit. She has type 2 diabetes mellitus. Her disease course has been stable. There are no hypoglycemic associated symptoms. Pertinent negatives for hypoglycemia include no dizziness, headaches or nervousness/anxiousness. There are no diabetic associated symptoms. Pertinent negatives for diabetes include no fatigue and no weakness. There are no hypoglycemic complications. Symptoms are stable. There are no diabetic complications. Current diabetic treatment includes oral agent (monotherapy). She is following a generally healthy diet. Meal planning includes avoidance of concentrated sweets and carbohydrate counting. An ACE inhibitor/angiotensin II receptor blocker is being taken.    Lab Results  Component Value Date   NA 140 09/29/2021   K 4.2 09/29/2021   CO2 24 09/29/2021   GLUCOSE 93 09/29/2021   BUN 21 09/29/2021   CREATININE 0.83 09/29/2021   CALCIUM 9.6 09/29/2021   EGFR 70 09/29/2021   GFRNONAA 79 09/13/2019   Lab Results  Component Value Date   CHOL 187 05/28/2021   HDL 99 05/28/2021   LDLCALC 79 05/28/2021   TRIG 45 05/28/2021   CHOLHDL 2.2 01/26/2018   Lab Results  Component Value Date   TSH 1.840 09/13/2019   Lab Results  Component Value Date   HGBA1C 6.5 (H) 09/29/2021   Lab Results  Component Value Date   WBC 3.8 09/29/2021   HGB 11.1 09/29/2021   HCT 36.4 09/29/2021   MCV 78 (L) 09/29/2021   PLT 300 09/29/2021   Lab Results  Component Value Date   ALT 13 09/29/2021   AST 17 09/29/2021   ALKPHOS 51 09/29/2021   BILITOT 0.3 09/29/2021   No results found for: "25OHVITD2", "25OHVITD3", "VD25OH"   Review of Systems  Constitutional: Negative.  Negative for chills, fatigue, fever and unexpected weight change.  HENT:  Negative for congestion, ear discharge, ear pain, rhinorrhea, sinus pressure,  sneezing and sore throat.   Respiratory:  Negative for cough, shortness of breath, wheezing and stridor.   Gastrointestinal:  Negative for abdominal pain, blood in stool, constipation, diarrhea and nausea.  Genitourinary:  Negative for dysuria, flank pain, frequency, hematuria, urgency and vaginal discharge.  Musculoskeletal:  Negative for arthralgias, back pain and myalgias.  Skin:  Negative for rash.  Neurological:  Negative for dizziness, weakness and headaches.  Hematological:  Negative for adenopathy. Does not bruise/bleed easily.  Psychiatric/Behavioral:  Negative for dysphoric mood. The patient is not nervous/anxious.     Patient Active Problem List   Diagnosis Date Noted   H/O total hysterectomy 05/22/2020   Myocardial infarction ([place]) 01/24/2018   Coronary artery disease 01/24/2018   Diabetes ([place]) 01/24/2018   Heart murmur 01/24/2018   Anemia 12/19/2015   Osteoarthritis of left glenohumeral joint 06/20/2015   Type 2 diabetes mellitus without complication, without long-term current use of insulin ([place]) 06/07/2014   Hypercholesterolemia 06/07/2014   Dyslipidemia 06/07/2014   Hypertension 06/07/2014   Thyrotoxicosis 06/07/2014   Routine general medical examination at a health care facility 06/07/2014   Screening for depression 06/07/2014    Allergies  Allergen Reactions   Ace Inhibitors Swelling   Altace  [Ramipril] Anaphylaxis   Clonidine Swelling   Losartan Potassium Other (See Comments)   Levothyroxine Rash    Past Surgical History:  Procedure Laterality Date   CARDIAC CATHETERIZATION  2005   CATARACT EXTRACTION Left 10/2014   CATARACT EXTRACTION W/PHACO Right  11/19/2014   Procedure: CATARACT EXTRACTION PHACO AND INTRAOCULAR LENS PLACEMENT (IOC);  Surgeon: Ronnell Freshwater, MD;  Location: [facility];  Service: Ophthalmology;  Laterality: Right;  DIABETIC - oral meds   COLONOSCOPY WITH PROPOFOL N/A 06/30/2016   Procedure: COLONOSCOPY WITH  PROPOFOL;  Surgeon: Lollie Sails, MD;  Location: [facility] ENDOSCOPY;  Service: Endoscopy;  Laterality: N/A;   EYE SURGERY     VAGINAL HYSTERECTOMY  02/10/1980    Social History   Tobacco Use   Smoking status: Former    Packs/day: 0.25    Years: 30.00    Total pack years: 7.50    Types: Cigarettes    Quit date: 2003    Years since quitting: 20.9   Smokeless tobacco: Never   Tobacco comments:    quit 2003  Vaping Use   Vaping Use: Never used  Substance Use Topics   Alcohol use: No    Alcohol/week: 0.0 standard drinks of alcohol   Drug use: No     Medication list has been reviewed and updated.  Current Meds  Medication Sig   aspirin 81 MG tablet Take 1 tablet (81 mg total) by mouth daily.   Calcium Carbonate-Vit D-Min (CALTRATE 600+D PLUS MINERALS) 600-800 MG-UNIT TABS Take 1 tablet by mouth daily.   carvedilol (COREG) 6.25 MG tablet Take 1 tablet by mouth 2 (two) times daily. [place]   docusate sodium (COLACE) 100 MG capsule Take 100 mg by mouth daily as needed for mild constipation.   ferrous sulfate (FEROSUL) 325 (65 FE) MG tablet Take 1 tablet (325 mg total) by mouth every other day.   glucose blood (ONETOUCH ULTRA) test strip USE TO TEST ONCE DAILY AS DIRECTED   hydrochlorothiazide (HYDRODIURIL) 25 MG tablet Take 1 tablet by mouth daily   metFORMIN (GLUCOPHAGE) 500 MG tablet Take 1 tablet (500 mg total) by mouth 2 (two) times daily.   Multiple Vitamins-Minerals (CENTRUM SILVER ULTRA WOMENS) TABS Take 1 tablet by mouth daily.   olmesartan (BENICAR) 5 MG tablet Take 1 tablet by mouth daily   Omega-3 Fatty Acids (FISH OIL) 1000 MG CAPS Take by mouth.   pravastatin (PRAVACHOL) 40 MG tablet Take 1 tablet by mouth daily   SYNTHROID 25 MCG tablet Take 1 tablet by mouth daily. Dr Truddie Coco       01/29/2022    2:28 PM 01/13/2022    3:01 PM 10/14/2021    1:27 PM 09/29/2021    9:33 AM  GAD 7 : Generalized Anxiety Score  Nervous, Anxious, on Edge 0 0 0 0  Control/stop  worrying 0 0 0 0  Worry too much - different things 0 0 0 0  Trouble relaxing 0 0 0 0  Restless 0 0 0 0  Easily annoyed or irritable 0 0 0 0  Afraid - awful might happen 0 0 0 0  Total GAD 7 Score 0 0 0 0  Anxiety Difficulty Not difficult at all Not difficult at all Not difficult at all Not difficult at all       01/29/2022    2:28 PM 01/13/2022    3:01 PM 10/14/2021    1:27 PM  Depression screen PHQ 2/9  Decreased Interest 0 0 0  Down, Depressed, Hopeless 0 0 0  PHQ - 2 Score 0 0 0  Altered sleeping 0 0 0  Tired, decreased energy 0 0 0  Change in appetite 0 0 0  Feeling bad or failure about yourself  0 0 0  Trouble concentrating 0 0 0  Moving slowly or fidgety/restless 0 0 0  Suicidal thoughts 0 0 0  PHQ-9 Score 0 0 0  Difficult doing work/chores Not difficult at all Not difficult at all Not difficult at all    BP Readings from Last 3 Encounters:  01/29/22 128/78  01/13/22 128/79  10/14/21 120/80    Physical Exam Vitals and nursing note reviewed. Exam conducted with a chaperone present.  Constitutional:      General: She is not in acute distress.    Appearance: She is not diaphoretic.  HENT:     Head: Normocephalic and atraumatic.     Right Ear: Tympanic membrane, ear canal and external ear normal.     Left Ear: Tympanic membrane, ear canal and external ear normal.     Nose: Nose normal. No congestion or rhinorrhea.     Mouth/Throat:     Pharynx: No oropharyngeal exudate or posterior oropharyngeal erythema.  Eyes:     General:        Right eye: No discharge.        Left eye: No discharge.     Conjunctiva/sclera: Conjunctivae normal.     Pupils: Pupils are equal, round, and reactive to light.  Neck:     Thyroid: No thyromegaly.     Vascular: No JVD.  Cardiovascular:     Rate and Rhythm: Normal rate and regular rhythm.     Heart sounds: Normal heart sounds. No murmur heard.    No friction rub. No gallop.  Pulmonary:     Effort: Pulmonary effort is normal.      Breath sounds: Normal breath sounds. No wheezing, rhonchi or rales.  Chest:     Chest wall: No tenderness.  Abdominal:     General: Bowel sounds are normal.     Palpations: Abdomen is soft. There is no mass.     Tenderness: There is no abdominal tenderness. There is no guarding.  Musculoskeletal:        General: Normal range of motion.     Cervical back: Normal range of motion and neck supple.  Lymphadenopathy:     Cervical: No cervical adenopathy.  Skin:    General: Skin is warm and dry.  Neurological:     Mental Status: She is alert.     Deep Tendon Reflexes: Reflexes are normal and symmetric.     Wt Readings from Last 3 Encounters:  01/29/22 120 lb (54.4 kg)  01/13/22 119 lb (54 kg)  10/14/21 117 lb 12.8 oz (53.4 kg)    BP 128/78   Pulse 72   Ht _0  (1.651 m)   Wt 120 lb (54.4 kg)   SpO2 97%   BMI 19.97 kg/m   Assessment and Plan:  1. Type 2 diabetes mellitus without complication, without long-term current use of insulin (HCC) Chronic.  Controlled.  Stable.  Patient will continue metformin 500 mg twice a day.  Will check A1c for current level of control as well as microalbuminuria with creatinine ratio to assess for glaucoma marrow or nephropathy.  Furthermore we will evaluate lipid panel for current statin therapy patient is on Pravachol 40 mg daily. - HgB A1c - Lipid Panel With LDL/HDL Ratio - Urine microalbumin-creatinine with uACR    Otilio Miu, MD

## 2022-01-30 ENCOUNTER — Telehealth: Payer: Self-pay

## 2022-01-30 LAB — LIPID PANEL WITH LDL/HDL RATIO
Cholesterol, Total: 179 mg/dL (ref 100–199)
HDL: 93 mg/dL (ref >39)
LDL Chol Calc (NIH): 78 mg/dL (ref 0–99)
LDL/HDL Ratio: 0.8 ratio (ref 0.0–3.2)
Triglycerides: 35 mg/dL (ref 0–149)
VLDL Cholesterol Cal: 8 mg/dL (ref 5–40)

## 2022-01-30 LAB — MICROALBUMIN / CREATININE URINE RATIO
Creatinine, Urine: 144.7 mg/dL
Microalb/Creat Ratio: 22 mg/g creat (ref 0–29)
Microalbumin, Urine: 31.2 ug/mL

## 2022-01-30 LAB — HEMOGLOBIN A1C
Est. average glucose Bld gHb Est-mCnc: 137 mg/dL
Hgb A1c MFr Bld: 6.4 % — ABNORMAL HIGH (ref 4.8–5.6)

## 2022-01-30 NOTE — Telephone Encounter (Signed)
Pt called, reviewed labs from yesterday. Pt had not further questions or concerns noted.

## 2022-02-03 ENCOUNTER — Telehealth: Payer: Self-pay | Admitting: Family Medicine

## 2022-02-03 NOTE — Telephone Encounter (Signed)
Copied from [place] [id number]. Topic: General - Other >> Jan 30, 2022  1:57 PM [place] B wrote: Reason for CRM: Patient called in about picking up copy of her lab results today, if possible. Please call back

## 2022-02-19 ENCOUNTER — Telehealth: Payer: Self-pay | Admitting: Family Medicine

## 2022-02-19 ENCOUNTER — Telehealth: Payer: Self-pay

## 2022-02-19 ENCOUNTER — Other Ambulatory Visit: Payer: Self-pay

## 2022-02-19 DIAGNOSIS — Z1231 Encounter for screening mammogram for malignant neoplasm of breast: Secondary | ICD-10-CM

## 2022-02-19 NOTE — Telephone Encounter (Signed)
Copied from [place] [id number]. Topic: General - Inquiry >> Feb 19, 2022 10:36 AM Leilani Able wrote: Reason for CRM: Pls place an order for pt for her yearly mammogram. Pt states that she had called and no one returned call..however last was Jan 9, 23. She is wanting to make sure she is timely, seems very important to her. Pls fu when done so she will know as she really seems to need fu call. [phone]

## 2022-02-19 NOTE — Telephone Encounter (Signed)
Called with mammo appt on 03/11/22 @ 4:00 [facility]

## 2022-02-23 ENCOUNTER — Ambulatory Visit: Payer: Medicare HMO

## 2022-02-27 ENCOUNTER — Ambulatory Visit (INDEPENDENT_AMBULATORY_CARE_PROVIDER_SITE_OTHER): Payer: Medicare HMO

## 2022-02-27 VITALS — BP 122/70 | HR 64 | Temp 98.5°F | Resp 17 | Ht 63.39 in | Wt 114.0 lb

## 2022-02-27 DIAGNOSIS — Z0000 Encounter for general adult medical examination without abnormal findings: Secondary | ICD-10-CM | POA: Diagnosis not present

## 2022-02-27 NOTE — Patient Instructions (Signed)

## 2022-02-27 NOTE — Progress Notes (Signed)
Subjective:   Vanessa Zamora is a 82 y.o. female who presents for Medicare Annual (Subsequent) preventive examination.  Review of Systems    Per HPI unless specifically indicated below.  Cardiac Risk Factors include: advanced age (>38mn, >>82women);female gender, diabetes mellitus, Hypertension, and Hyperlipidemia.        Objective:    Today's Vitals   02/27/22 1318  BP: 122/70  Pulse: 64  Resp: 17  Temp: 98.5 F (36.9 C)  TempSrc: Oral  Weight: 114 lb (51.7 kg)  Height: 5' 3.39" (1.61 m)  PainSc: 7   PainLoc: Shoulder   Body mass index is 19.95 kg/m.     02/27/2022    1:37 PM 02/19/2021    8:54 AM 02/19/2020    9:06 AM 02/15/2019    9:00 AM 01/24/2018   10:30 AM 01/21/2017   10:01 AM 06/30/2016   10:14 AM  Advanced Directives  Does Patient Have a Medical Advance Directive? Yes Yes Yes Yes No Yes Yes  Type of AParamedicof [place]atalbanyLiving will HPlant[place]Living will HSwantonLiving will HHobe SoundLiving will  HGrand Canyon VillageLiving will Living will  Does patient want to make changes to medical advance directive? No - Patient declined    Yes (MAU/Ambulatory/Procedural Areas - Information given)    Copy of [place]in Chart? Yes - validated most recent copy scanned in chart (See row information) Yes - validated most recent copy scanned in chart (See row information) Yes - validated most recent copy scanned in chart (See row information) No - copy requested  No - copy requested     Current Medications (verified) Outpatient Encounter Medications as of 02/27/2022  Medication Sig   aspirin 81 MG tablet Take 1 tablet (81 mg total) by mouth daily.   Calcium Carbonate-Vit D-Min (CALTRATE 600+D PLUS MINERALS) 600-800 MG-UNIT TABS Take 1 tablet by mouth daily.   carvedilol (COREG) 6.25 MG tablet Take 1 tablet by mouth 2 (two) times daily. [place]   docusate sodium  (COLACE) 100 MG capsule Take 100 mg by mouth daily as needed for mild constipation.   ferrous sulfate (FEROSUL) 325 (65 FE) MG tablet Take 1 tablet (325 mg total) by mouth every other day.   glucose blood (ONETOUCH ULTRA) test strip USE TO TEST ONCE DAILY AS DIRECTED   hydrochlorothiazide (HYDRODIURIL) 25 MG tablet Take 1 tablet by mouth daily   metFORMIN (GLUCOPHAGE) 500 MG tablet Take 1 tablet (500 mg total) by mouth 2 (two) times daily.   Multiple Vitamins-Minerals (CENTRUM SILVER ULTRA WOMENS) TABS Take 1 tablet by mouth daily.   olmesartan (BENICAR) 5 MG tablet Take 1 tablet by mouth daily   Omega-3 Fatty Acids (FISH OIL) 1000 MG CAPS Take by mouth.   pravastatin (PRAVACHOL) 40 MG tablet Take 1 tablet by mouth daily   SYNTHROID 25 MCG tablet Take 1 tablet by mouth daily. Dr MTruddie Coco  No facility-administered encounter medications on file as of 02/27/2022.    Allergies (verified) Ace inhibitors, Altace  [ramipril], Clonidine, Losartan potassium, and Levothyroxine   History: Past Medical History:  Diagnosis Date   Arthritis    shoulders, knees right side   Coronary artery disease    Diabetes mellitus without complication ([place]    Hypercholesteremia    Hypertension    Myocardial infarction ([place] 2005   Seasonal allergies    Thyroid goiter    Wears dentures    partial lower  Past Surgical History:  Procedure Laterality Date   CARDIAC CATHETERIZATION  2005   CATARACT EXTRACTION Left 10/2014   CATARACT EXTRACTION W/PHACO Right 11/19/2014   Procedure: CATARACT EXTRACTION PHACO AND INTRAOCULAR LENS PLACEMENT ([place]);  Surgeon: Ronnell Freshwater, MD;  Location: [facility];  Service: Ophthalmology;  Laterality: Right;  DIABETIC - oral meds   COLONOSCOPY WITH PROPOFOL N/A 06/30/2016   Procedure: COLONOSCOPY WITH PROPOFOL;  Surgeon: Lollie Sails, MD;  [facility] END[facility];  Service: Endoscopy;  Laterality: N/A;   EYE SURGERY     VAGINAL HYSTERECTOMY   02/10/1980   Family History  Problem Relation Age of Onset   Stroke Mother    Aneurysm Sister    Heart disease Brother    Hypertension Brother    Goiter Maternal Grandmother    Diabetes Maternal Grandmother    COPD Brother    Hypertension Brother    Breast cancer Neg Hx    Social History   Socioeconomic History   Marital status: Single    Spouse name: Not on file   Number of children: 1   Years of education: Not on file   Highest education level: Not on file  Occupational History   Occupation: Retired  Tobacco Use   Smoking status: Former    Packs/day: 0.25    Years: 30.00    Total pack years: 7.50    Types: Cigarettes    Quit date: 2003    Years since quitting: 21.0   Smokeless tobacco: Never   Tobacco comments:    quit 2003  Vaping Use   Vaping Use: Never used  Substance and Sexual Activity   Alcohol use: No    Alcohol/week: 0.0 standard drinks of alcohol   Drug use: No   Sexual activity: Not Currently  Other Topics Concern   Not on file  Social History Narrative   Pt lives alone   Social Determinants of Health   Financial Resource Strain: Low Risk  (02/27/2022)   Overall Financial Resource Strain (CARDIA)    Difficulty of Paying Living Expenses: Not hard at all  Food Insecurity: No Food Insecurity (02/27/2022)   Hunger Vital Sign    Worried About Running Out of Food in the Last Year: Never true    [place] in the Last Year: Never true  Transportation Needs: No Transportation Needs (02/27/2022)   PRAPARE - Hydrologist (Medical): No    Lack of Transportation (Non-Medical): No  Physical Activity: Insufficiently Active (02/27/2022)   Exercise Vital Sign    Days of Exercise per Week: 3 days    Minutes of Exercise per Session: 20 min  Stress: No Stress Concern Present (02/27/2022)   [place]    Feeling of Stress : Not at all  Social Connections: Moderately  Integrated (02/27/2022)   Social Connection and Isolation Panel [NHANES]    Frequency of Communication with Friends and Family: More than three times a week    Frequency of Social Gatherings with Friends and Family: More than three times a week    Attends Religious Services: More than 4 times per year    Active Member of [employer] or Organizations: Yes    Attends Music therapist: More than 4 times per year    Marital Status: Divorced    Tobacco Counseling Counseling given: Not Answered Tobacco comments: quit 2003   Clinical Intake:  Pre-visit preparation completed: No  Pain :  0-10 Pain Score: 7  Pain Type: Chronic pain Pain Location: Shoulder Pain Orientation: Left Pain Descriptors / Indicators: Sharp     Nutritional Status: BMI <19  Underweight Nutritional Risks: None Diabetes: No  How often do you need to have someone help you when you read instructions, pamphlets, or other written materials from your doctor or pharmacy?: 1 - Never  Diabetic?Nutrition Risk Assessment:  Has the patient had any N/V/D within the last 2 months?  No  Does the patient have any non-healing wounds?  No  Has the patient had any unintentional weight loss or weight gain?  Yes   Diabetes:  Is the patient diabetic?  Yes  If diabetic, was a CBG obtained today?  Yes  Did the patient bring in their glucometer from home?  No  How often do you monitor your CBG's? Once daily .   Financial Strains and Diabetes Management:  Are you having any financial strains with the device, your supplies or your medication? No .  Does the patient want to be seen by Chronic Care Management for management of their diabetes?  No  Would the patient like to be referred to a Nutritionist or for Diabetic Management?  No   Diabetic Exams:  Diabetic Eye Exam: Completed 11/28/2021 Diabetic Foot Exam: Completed 02/24/2021    Interpreter Needed?: No  Information entered by :: Donnie Mesa,  CMA   Activities of Daily Living    02/27/2022    1:15 PM  In your present state of health, do you have any difficulty performing the following activities:  Hearing? 0  Vision? [place], [place]  Difficulty concentrating or making decisions? 1  Walking or climbing stairs? 0  Dressing or bathing? 0  Doing errands, shopping? 0    Patient Care Team: Juline Patch, MD as PCP - General (Family Medicine) Ronnald Collum, Lourdes Sledge, MD as Consulting Physician (Endocrinology) Yolonda Kida, MD as Consulting Physician (Cardiology) Samara Deist, DPM as Referring Physician (Podiatry) Eulogio Bear, MD as Consulting Physician (Ophthalmology)  Indicate any recent Medical Services you may have received from other than [facility] providers in the past year (date may be approximate). The pt was seen on 10/27/2021 by Dr. Vickki Muff at [facility].     Assessment:   This is a routine wellness examination for [place].  Hearing/Vision screen Denies any hearing issues. Denies any change in her vision. Wear glasses, Annual Eye Exam, [facility].  Dietary issues and exercise activities discussed: Current Exercise Habits: Structured exercise class, Type of exercise: walking, Time (Minutes): 20, Frequency (Times/Week): 4, Weekly Exercise (Minutes/Week): 80, Intensity: Moderate, Exercise limited by: None identified   Goals Addressed   None    Depression Screen    02/27/2022    1:13 PM 01/29/2022    2:28 PM 01/13/2022    3:01 PM 10/14/2021    1:27 PM 09/29/2021    9:33 AM 05/28/2021   10:11 AM 05/28/2021   10:05 AM  PHQ 2/9 Scores  PHQ - 2 Score 0 0 0 0 0 5 0  PHQ- 9 Score  0 0 0 0 5 0    Fall Risk    02/27/2022    1:15 PM 01/29/2022    2:28 PM 01/13/2022    3:01 PM 10/14/2021    1:28 PM 09/29/2021    9:33 AM  [place] in the past year? 0 0 0 0 0  Number falls in past yr: 0 0  0 0 0  Injury with Fall? 0 0 0 0 0  Risk for fall due to : No Fall  Risks No Fall Risks No Fall Risks No Fall Risks No Fall Risks  Follow up Falls evaluation completed Falls evaluation completed Falls evaluation completed Falls evaluation completed Falls evaluation completed    [place]:  Any stairs in or around the home? No  If so, are there any without handrails? Yes  Home free of loose throw rugs in walkways, pet beds, electrical cords, etc? Yes  Adequate lighting in your home to reduce risk of falls? Yes   ASSISTIVE DEVICES UTILIZED TO PREVENT FALLS:  Life alert? Yes  Use of a cane, walker or w/c? No  Grab bars in the bathroom? Yes  Shower chair or bench in shower? No  Elevated toilet seat or a handicapped toilet? Yes   TIMED UP AND GO:  Was the test performed? Yes .  Length of time to ambulate 10 feet: 10  sec.   Gait steady and fast without use of assistive device  Cognitive Function:        02/27/2022    1:17 PM 02/19/2020    9:08 AM 02/15/2019    9:08 AM 01/24/2018   10:35 AM 01/21/2017   10:00 AM  6CIT Screen  What Year? 0 points 0 points 0 points 0 points 0 points  What month? 0 points 0 points 0 points 0 points 0 points  What time? 0 points 0 points 0 points 0 points 0 points  Count back from 20 0 points 0 points 0 points 0 points 0 points  Months in reverse 0 points 0 points 0 points 0 points 0 points  Repeat phrase 2 points 0 points 0 points 0 points 0 points  Total Score 2 points 0 points 0 points 0 points 0 points    Immunizations Immunization History  Administered Date(s) Administered   Fluad Quad(high Dose 65+) 11/09/2018, 11/14/2019, 11/14/2020, 11/24/2021   Influenza, High Dose Seasonal PF 11/24/2016, 11/12/2017   Influenza, Seasonal, Injecte, Preservative Fre 11/13/2010, 11/18/2011   Influenza,inj,Quad PF,6+ Mos 11/16/2012, 11/13/2013, 12/06/2014, 11/12/2015   Influenza-Unspecified 11/13/2013   PFIZER(Purple Top)SARS-COV-2 Vaccination 03/02/2019, 03/23/2019, 11/30/2019,  05/29/2020, 11/15/2020   PNEUMOCOCCAL CONJUGATE-20 05/28/2021   Pfizer Covid-19 Vaccine Bivalent Booster 30yr & up 11/26/2021   Pneumococcal Conjugate-13 12/18/2014   Pneumococcal Polysaccharide-23 05/08/2010, 05/13/2012   Td 09/26/1987   Tdap 01/28/2016   Zoster Recombinat (Shingrix) 12/10/2017, 02/17/2018   Zoster, Live 01/09/2013    TDAP status: Up to date  Flu Vaccine status: Up to date  Pneumococcal vaccine status: Up to date  Covid-19 vaccine status: Information provided on how to obtain vaccines.   Qualifies for Shingles Vaccine? Yes   Zostavax completed Yes   Shingrix Completed?: Yes  Screening Tests Health Maintenance  Topic Date Due   COVID-19 Vaccine (7 - 2023-24 season) 01/21/2022   MAMMOGRAM  02/17/2022   FOOT EXAM  02/24/2022   HEMOGLOBIN A1C  07/31/2022   Diabetic kidney evaluation - eGFR measurement  09/30/2022   OPHTHALMOLOGY EXAM  11/29/2022   Diabetic kidney evaluation - Urine ACR  01/30/2023   Medicare Annual Wellness (AWV)  02/28/2023   DTaP/Tdap/Td (3 - Td or Tdap) 01/27/2026   Pneumonia Vaccine 82 Years old  Completed   INFLUENZA VACCINE  Completed   DEXA SCAN  Completed   Zoster Vaccines- Shingrix  Completed   HPV VACCINES  Aged Out    Health Maintenance  Health Maintenance Due  Topic Date Due   COVID-19 Vaccine (7 - 2023-24 season) 01/21/2022   MAMMOGRAM  02/17/2022   FOOT EXAM  02/24/2022    Colorectal cancer screening: No longer required.   Mammogram status: Completed 02/17/2021. Repeat every year  DEXA Scan: 03/16/2018  Lung Cancer Screening: (Low Dose CT Chest recommended if Age 26-80 years, 30 pack-year currently smoking OR have quit w/in 15years.) does not qualify.   Lung Cancer Screening Referral: not applicable   Additional Screening:  Hepatitis C Screening: does not qualify; not applicable   Vision Screening: Recommended annual ophthalmology exams for early detection of glaucoma and other disorders of the eye. Is  the patient up to date with their annual eye exam?  Yes  Who is the provider or what is the name of the office in which the patient attends annual eye exams? [facility]  If pt is not established with a provider, would they like to be referred to a provider to establish care? No .   Dental Screening: Recommended annual dental exams for proper oral hygiene  Community Resource Referral / Chronic Care Management: CRR required this visit?  No   CCM required this visit?  No      Plan:     I have personally reviewed and noted the following in the patient's chart:   Medical and social history Use of alcohol, tobacco or illicit drugs  Current medications and supplements including opioid prescriptions. Patient is not currently taking opioid prescriptions. Functional ability and status Nutritional status Physical activity Advanced directives List of other physicians Hospitalizations, surgeries, and ER visits in previous 12 months Vitals Screenings to include cognitive, depression, and falls Referrals and appointments  In addition, I have reviewed and discussed with patient certain preventive protocols, quality metrics, and best practice recommendations. A written personalized care plan for preventive services as well as general preventive health recommendations were provided to patient.    Ms. Varano , Thank you for taking time to come for your Medicare Wellness Visit. I appreciate your ongoing commitment to your health goals. Please review the following plan we discussed and let me know if I can assist you in the future.   These are the goals we discussed:  Goals      DIET - INCREASE WATER INTAKE     Recommend 6-8 glasses of water per day     Prevent falls     Recommend to remove items from the home that may cause slips or trips        This is a list of the screening recommended for you and due dates:  Health Maintenance  Topic Date Due   COVID-19 Vaccine (7 -  2023-24 season) 01/21/2022   Mammogram  02/17/2022   Complete foot exam   02/24/2022   Hemoglobin A1C  07/31/2022   Yearly kidney function blood test for diabetes  09/30/2022   Eye exam for diabetics  11/29/2022   Yearly kidney health urinalysis for diabetes  01/30/2023   Medicare Annual Wellness Visit  02/28/2023   DTaP/Tdap/Td vaccine (3 - Td or Tdap) 01/27/2026   Pneumonia Vaccine  Completed   Flu Shot  Completed   DEXA scan (bone density measurement)  Completed   Zoster (Shingles) Vaccine  Completed   HPV Vaccine  Aged [address] [place]   02/27/2022   Nurse Notes: Approximately 30 minute Face -To-Face Medicare Wellness Visit

## 2022-03-11 ENCOUNTER — Ambulatory Visit
Admission: RE | Admit: 2022-03-11 | Discharge: 2022-03-11 | Disposition: A | Discharge status: Home / Self Care | Payer: Medicare HMO | Source: Ambulatory Visit | Attending: Family Medicine | Admitting: Family Medicine

## 2022-03-11 DIAGNOSIS — Z1231 Encounter for screening mammogram for malignant neoplasm of breast: Secondary | ICD-10-CM | POA: Diagnosis not present

## 2022-03-16 ENCOUNTER — Encounter: Payer: Self-pay | Admitting: Family Medicine

## 2022-03-16 ENCOUNTER — Ambulatory Visit (INDEPENDENT_AMBULATORY_CARE_PROVIDER_SITE_OTHER): Payer: Medicare HMO | Admitting: Family Medicine

## 2022-03-16 VITALS — BP 120/76 | HR 68 | Ht 63.0 in | Wt 116.0 lb

## 2022-03-16 DIAGNOSIS — M19012 Primary osteoarthritis, left shoulder: Secondary | ICD-10-CM | POA: Diagnosis not present

## 2022-03-16 DIAGNOSIS — M19011 Primary osteoarthritis, right shoulder: Secondary | ICD-10-CM | POA: Diagnosis not present

## 2022-03-16 NOTE — Progress Notes (Signed)
Date:  03/16/2022   Name:  Vanessa Zamora   DOB:  08/15/1940   MRN:  [id number]   Chief Complaint: Shoulder Pain (Pain radiates down L) arm- told she has osteoarthritis)  Shoulder Pain  The pain is present in the left shoulder and right shoulder. This is a chronic problem. The current episode started more than 1 month ago. There has been no history of extremity trauma. The problem has been gradually worsening. The quality of the pain is described as aching. The pain is at a severity of 5/10. Associated symptoms include a limited range of motion and stiffness. Pertinent negatives include no fever or inability to bear weight. The symptoms are aggravated by activity. Treatments tried: topical. Her past medical history is significant for osteoarthritis.    Lab Results  Component Value Date   NA 140 09/29/2021   K 4.2 09/29/2021   CO2 24 09/29/2021   GLUCOSE 93 09/29/2021   BUN 21 09/29/2021   CREATININE 0.83 09/29/2021   CALCIUM 9.6 09/29/2021   EGFR 70 09/29/2021   GFRNONAA 79 09/13/2019   Lab Results  Component Value Date   CHOL 179 01/29/2022   HDL 93 01/29/2022   LDLCALC 78 01/29/2022   TRIG 35 01/29/2022   CHOLHDL 2.2 01/26/2018   Lab Results  Component Value Date   TSH 1.840 09/13/2019   Lab Results  Component Value Date   HGBA1C 6.4 (H) 01/29/2022   Lab Results  Component Value Date   WBC 3.8 09/29/2021   HGB 11.1 09/29/2021   HCT 36.4 09/29/2021   MCV 78 (L) 09/29/2021   PLT 300 09/29/2021   Lab Results  Component Value Date   ALT 13 09/29/2021   AST 17 09/29/2021   ALKPHOS 51 09/29/2021   BILITOT 0.3 09/29/2021   No results found for: "25OHVITD2", "25OHVITD3", "VD25OH"   Review of Systems  Constitutional: Negative.  Negative for chills, fatigue, fever and unexpected weight change.  HENT:  Negative for congestion, ear discharge, ear pain, rhinorrhea, sinus pressure, sneezing and sore throat.   Respiratory:  Negative for cough, shortness of breath,  wheezing and stridor.   Gastrointestinal:  Negative for abdominal pain, diarrhea and nausea.  Genitourinary:  Positive for vaginal discharge. Negative for dysuria, flank pain, frequency, hematuria and urgency.  Musculoskeletal:  Positive for stiffness. Negative for arthralgias, back pain and myalgias.  Skin:  Negative for rash.  Neurological:  Negative for dizziness, weakness and headaches.  Hematological:  Negative for adenopathy. Does not bruise/bleed easily.  Psychiatric/Behavioral:  Negative for dysphoric mood. The patient is not nervous/anxious.     Patient Active Problem List   Diagnosis Date Noted   H/O total hysterectomy 05/22/2020   Myocardial infarction ([place]) 01/24/2018   Coronary artery disease 01/24/2018   Diabetes ([place]) 01/24/2018   Heart murmur 01/24/2018   Anemia 12/19/2015   Osteoarthritis of left glenohumeral joint 06/20/2015   Type 2 diabetes mellitus without complication, without long-term current use of insulin ([place]) 06/07/2014   Hypercholesterolemia 06/07/2014   Dyslipidemia 06/07/2014   Hypertension 06/07/2014   Thyrotoxicosis 06/07/2014   Routine general medical examination at a health care facility 06/07/2014   Screening for depression 06/07/2014    Allergies  Allergen Reactions   Ace Inhibitors Swelling   Altace  [Ramipril] Anaphylaxis   Clonidine Swelling   Losartan Potassium Other (See Comments)   Levothyroxine Rash    Past Surgical History:  Procedure Laterality Date   CARDIAC CATHETERIZATION  2005   CATARACT  EXTRACTION Left 10/2014   CATARACT EXTRACTION W/PHACO Right 11/19/2014   Procedure: CATARACT EXTRACTION PHACO AND INTRAOCULAR LENS PLACEMENT ([place]);  Surgeon: Ronnell Freshwater, MD;  Location: [facility];  Service: Ophthalmology;  Laterality: Right;  DIABETIC - oral meds   COLONOSCOPY WITH PROPOFOL N/A 06/30/2016   Procedure: COLONOSCOPY WITH PROPOFOL;  Surgeon: Lollie Sails, MD;  Location: [facility] ENDOSCOPY;  Service:  Endoscopy;  Laterality: N/A;   EYE SURGERY     VAGINAL HYSTERECTOMY  02/10/1980    Social History   Tobacco Use   Smoking status: Former    Packs/day: 0.25    Years: 30.00    Total pack years: 7.50    Types: Cigarettes    Quit date: 2003    Years since quitting: 21.1   Smokeless tobacco: Never   Tobacco comments:    quit 2003  Vaping Use   Vaping Use: Never used  Substance Use Topics   Alcohol use: No    Alcohol/week: 0.0 standard drinks of alcohol   Drug use: No     Medication list has been reviewed and updated.  Current Meds  Medication Sig   aspirin 81 MG tablet Take 1 tablet (81 mg total) by mouth daily.   Calcium Carbonate-Vit D-Min (CALTRATE 600+D PLUS MINERALS) 600-800 MG-UNIT TABS Take 1 tablet by mouth daily.   carvedilol (COREG) 6.25 MG tablet Take 1 tablet by mouth 2 (two) times daily. [place]   docusate sodium (COLACE) 100 MG capsule Take 100 mg by mouth daily as needed for mild constipation.   ferrous sulfate (FEROSUL) 325 (65 FE) MG tablet Take 1 tablet (325 mg total) by mouth every other day.   glucose blood (ONETOUCH ULTRA) test strip USE TO TEST ONCE DAILY AS DIRECTED   hydrochlorothiazide (HYDRODIURIL) 25 MG tablet Take 1 tablet by mouth daily   metFORMIN (GLUCOPHAGE) 500 MG tablet Take 1 tablet (500 mg total) by mouth 2 (two) times daily.   Multiple Vitamins-Minerals (CENTRUM SILVER ULTRA WOMENS) TABS Take 1 tablet by mouth daily.   olmesartan (BENICAR) 5 MG tablet Take 1 tablet by mouth daily   Omega-3 Fatty Acids (FISH OIL) 1000 MG CAPS Take by mouth.   pravastatin (PRAVACHOL) 40 MG tablet Take 1 tablet by mouth daily   SYNTHROID 25 MCG tablet Take 1 tablet by mouth daily. Dr Truddie Coco       03/16/2022   10:33 AM 01/29/2022    2:28 PM 01/13/2022    3:01 PM 10/14/2021    1:27 PM  GAD 7 : Generalized Anxiety Score  Nervous, Anxious, on Edge 0 0 0 0  Control/stop worrying 0 0 0 0  Worry too much - different things 0 0 0 0  Trouble relaxing 0 0 0 0   Restless 0 0 0 0  Easily annoyed or irritable 0 0 0 0  Afraid - awful might happen 0 0 0 0  Total GAD 7 Score 0 0 0 0  Anxiety Difficulty Not difficult at all Not difficult at all Not difficult at all Not difficult at all       03/16/2022   10:33 AM 02/27/2022    1:13 PM 01/29/2022    2:28 PM  Depression screen PHQ 2/9  Decreased Interest 0 0 0  Down, Depressed, Hopeless 0 0 0  PHQ - 2 Score 0 0 0  Altered sleeping 0  0  Tired, decreased energy 0  0  Change in appetite 0  0  Feeling bad or failure  about yourself  0  0  Trouble concentrating 0  0  Moving slowly or fidgety/restless 0  0  Suicidal thoughts 0  0  PHQ-9 Score 0  0  Difficult doing work/chores Not difficult at all  Not difficult at all    BP Readings from Last 3 Encounters:  03/16/22 120/76  02/27/22 122/70  01/29/22 128/78    Physical Exam Vitals and nursing note reviewed. Exam conducted with a chaperone present.  Constitutional:      General: She is not in acute distress.    Appearance: She is not diaphoretic.  HENT:     Head: Normocephalic and atraumatic.     Right Ear: Tympanic membrane and external ear normal.     Left Ear: Tympanic membrane and external ear normal.     Nose: Nose normal.     Mouth/Throat:     Mouth: Mucous membranes are moist.  Eyes:     General:        Right eye: No discharge.        Left eye: No discharge.     Conjunctiva/sclera: Conjunctivae normal.     Pupils: Pupils are equal, round, and reactive to light.  Neck:     Thyroid: No thyromegaly.     Vascular: No JVD.  Cardiovascular:     Rate and Rhythm: Normal rate and regular rhythm.     Heart sounds: Normal heart sounds. No murmur heard.    No friction rub. No gallop.  Pulmonary:     Effort: Pulmonary effort is normal.     Breath sounds: Normal breath sounds. No wheezing or rhonchi.  Abdominal:     General: Bowel sounds are normal.     Palpations: Abdomen is soft. There is no mass.     Tenderness: There is no  abdominal tenderness. There is no guarding.  Musculoskeletal:     Right shoulder: Tenderness present.     Left shoulder: Tenderness present.     Cervical back: Normal range of motion and neck supple.     Comments: supraspinatis  Lymphadenopathy:     Cervical: No cervical adenopathy.  Skin:    General: Skin is warm and dry.  Neurological:     Mental Status: She is alert.     Deep Tendon Reflexes: Reflexes are normal and symmetric.     Wt Readings from Last 3 Encounters:  03/16/22 116 lb (52.6 kg)  02/27/22 114 lb (51.7 kg)  01/29/22 120 lb (54.4 kg)    BP 120/76   Pulse 68   Ht '5\' 3"'$  (1.6 m)   Wt 116 lb (52.6 kg)   SpO2 97%   BMI 20.55 kg/m   Assessment and Plan: 1. Primary osteoarthritis of both shoulders Chronic.  Persistent.  Stable.  Over the course of the past year there has been decreased range of motion particularly with abduction of both shoulders.  Tenderness is noted over the supraspinatus and we will refer to orthopedics for evaluation. - Ambulatory referral to Orthopedic Surgery     Otilio Miu, MD

## 2022-03-24 ENCOUNTER — Other Ambulatory Visit: Payer: Self-pay

## 2022-03-24 DIAGNOSIS — E119 Type 2 diabetes mellitus without complications: Secondary | ICD-10-CM

## 2022-03-24 DIAGNOSIS — I10 Essential (primary) hypertension: Secondary | ICD-10-CM

## 2022-03-24 DIAGNOSIS — E785 Hyperlipidemia, unspecified: Secondary | ICD-10-CM

## 2022-03-24 DIAGNOSIS — E7800 Pure hypercholesterolemia, unspecified: Secondary | ICD-10-CM

## 2022-03-24 MED ORDER — HYDROCHLOROTHIAZIDE 25 MG PO TABS: ORAL_TABLET | ORAL | 1 refills | Status: DC

## 2022-03-24 MED ORDER — PRAVASTATIN SODIUM 40 MG PO TABS: ORAL_TABLET | ORAL | 1 refills | Status: DC

## 2022-03-24 MED ORDER — METFORMIN HCL 500 MG PO TABS: 500.0000 mg | ORAL_TABLET | Freq: Two times a day (BID) | ORAL | 1 refills | Status: DC

## 2022-03-24 MED ORDER — OLMESARTAN MEDOXOMIL 5 MG PO TABS: ORAL_TABLET | ORAL | 1 refills | Status: DC

## 2022-05-27 ENCOUNTER — Other Ambulatory Visit: Payer: Self-pay | Admitting: Family Medicine

## 2022-05-27 DIAGNOSIS — E119 Type 2 diabetes mellitus without complications: Secondary | ICD-10-CM

## 2022-05-28 NOTE — Telephone Encounter (Signed)
Requested Prescriptions  Pending Prescriptions Disp Refills   ONETOUCH ULTRA test strip [Pharmacy Med Name: ONE TOUCH ULTRA BLUE TESTST(NEW)100] 100 strip 1    Sig: USE TO TEST ONCE DAILY AS DIRECTED     Endocrinology: Diabetes - Testing Supplies Passed - 05/27/2022  3:33 AM      Passed - Valid encounter within last 12 months    Recent Outpatient Visits           2 months ago Primary osteoarthritis of both shoulders   [place] [facility], MD   3 months ago Type 2 diabetes mellitus without complication, without long-term current use of insulin (HCC)   [place] [facility], MD   4 months ago Type 2 diabetes mellitus without complication, without long-term current use of insulin (HCC)   [place] [facility], MD   7 months ago Weight loss   [facility], MD   8 months ago Essential (primary) hypertension    [facility], MD       Future Appointments             In 4 days Duanne Limerick, MD [facility] at [facility], [facility]

## 2022-06-01 ENCOUNTER — Encounter: Payer: Self-pay | Admitting: Family Medicine

## 2022-06-01 ENCOUNTER — Ambulatory Visit
Admission: RE | Admit: 2022-06-01 | Discharge: 2022-06-01 | Disposition: A | Discharge status: Home / Self Care | Payer: Medicare HMO | Source: Ambulatory Visit | Attending: Family Medicine | Admitting: Family Medicine

## 2022-06-01 ENCOUNTER — Ambulatory Visit
Admission: RE | Admit: 2022-06-01 | Discharge: 2022-06-01 | Disposition: A | Discharge status: Home / Self Care | Payer: Medicare HMO | Attending: Family Medicine | Admitting: Family Medicine

## 2022-06-01 ENCOUNTER — Ambulatory Visit: Payer: Medicare HMO | Admitting: Family Medicine

## 2022-06-01 VITALS — BP 120/70 | HR 57 | Ht 63.0 in | Wt 113.0 lb

## 2022-06-01 DIAGNOSIS — E7800 Pure hypercholesterolemia, unspecified: Secondary | ICD-10-CM

## 2022-06-01 DIAGNOSIS — E785 Hyperlipidemia, unspecified: Secondary | ICD-10-CM

## 2022-06-01 DIAGNOSIS — G4486 Cervicogenic headache: Secondary | ICD-10-CM | POA: Insufficient documentation

## 2022-06-01 DIAGNOSIS — E119 Type 2 diabetes mellitus without complications: Secondary | ICD-10-CM

## 2022-06-01 DIAGNOSIS — I10 Essential (primary) hypertension: Secondary | ICD-10-CM

## 2022-06-01 DIAGNOSIS — D509 Iron deficiency anemia, unspecified: Secondary | ICD-10-CM

## 2022-06-01 DIAGNOSIS — J301 Allergic rhinitis due to pollen: Secondary | ICD-10-CM

## 2022-06-01 DIAGNOSIS — M542 Cervicalgia: Secondary | ICD-10-CM

## 2022-06-01 MED ORDER — HYDROCHLOROTHIAZIDE 25 MG PO TABS: ORAL_TABLET | ORAL | 1 refills | Status: DC

## 2022-06-01 MED ORDER — FERROUS SULFATE 325 (65 FE) MG PO TABS
325.0000 mg | ORAL_TABLET | ORAL | 1 refills | Status: DC
Start: 2022-06-01 — End: 2023-06-07

## 2022-06-01 MED ORDER — MELOXICAM 7.5 MG PO TABS
7.5000 mg | ORAL_TABLET | Freq: Every day | ORAL | 0 refills | Status: DC
Start: 2022-06-01 — End: 2022-06-15

## 2022-06-01 MED ORDER — OLMESARTAN MEDOXOMIL 5 MG PO TABS
ORAL_TABLET | ORAL | 1 refills | Status: DC
Start: 2022-06-01 — End: 2022-11-24

## 2022-06-01 MED ORDER — TRIAMCINOLONE ACETONIDE 55 MCG/ACT NA AERO
2.0000 | INHALATION_SPRAY | Freq: Every day | NASAL | 12 refills | Status: DC
Start: 2022-06-01 — End: 2022-10-06

## 2022-06-01 MED ORDER — METFORMIN HCL 500 MG PO TABS: 500.0000 mg | ORAL_TABLET | Freq: Two times a day (BID) | ORAL | 1 refills | Status: DC

## 2022-06-01 MED ORDER — PRAVASTATIN SODIUM 40 MG PO TABS
ORAL_TABLET | ORAL | 1 refills | Status: DC
Start: 2022-06-01 — End: 2022-11-23

## 2022-06-01 NOTE — Progress Notes (Signed)
Date:  06/01/2022   Name:  Vanessa Zamora   DOB:  03-03-1940   MRN:  [id number]   Chief Complaint: Anemia, Hypertension, Hyperlipidemia, Prediabetes, and Neck Pain  Anemia Presents for follow-up visit. There has been no abdominal pain, bruising/bleeding easily, light-headedness, malaise/fatigue or palpitations. Signs of blood loss that are not present include hematemesis, hematochezia, melena and vaginal bleeding. There is no history of chronic renal disease, heart failure or hypothyroidism. There are no compliance problems.   Hypertension This is a chronic problem. The current episode started more than 1 year ago. The problem has been gradually improving since onset. The problem is controlled. Associated symptoms include headaches and neck pain. Pertinent negatives include no anxiety, blurred vision, chest pain, malaise/fatigue, orthopnea, palpitations, peripheral edema, PND, shortness of breath or sweats. There are no associated agents to hypertension. Risk factors for coronary artery disease include dyslipidemia. Past treatments include diuretics and angiotensin blockers. The current treatment provides moderate improvement. There are no compliance problems.  There is no history of angina, kidney disease, CAD/MI, CVA, heart failure, left ventricular hypertrophy, PVD or retinopathy. There is no history of chronic renal disease, a hypertension causing med or renovascular disease.  Hyperlipidemia This is a chronic problem. The current episode started more than 1 year ago. The problem is controlled. Recent lipid tests were reviewed and are normal. Exacerbating diseases include diabetes. She has no history of chronic renal disease or hypothyroidism. Pertinent negatives include no chest pain or shortness of breath. Current antihyperlipidemic treatment includes statins. The current treatment provides moderate improvement of lipids. There are no compliance problems.  Risk factors for coronary artery disease  include dyslipidemia, hypertension and diabetes mellitus.  Neck Pain  This is a new problem. The current episode started more than 1 month ago. The problem has been waxing and waning. The pain is present in the left side. The quality of the pain is described as aching. The pain is at a severity of 3/10. Nothing aggravates the symptoms. Associated symptoms include headaches. Pertinent negatives include no chest pain, numbness, tingling or weakness.  Diabetes She presents for her follow-up diabetic visit. She has type 2 diabetes mellitus. Her disease course has been stable. Hypoglycemia symptoms include headaches. Pertinent negatives for hypoglycemia include no nervousness/anxiousness or sweats. Pertinent negatives for diabetes include no blurred vision, no chest pain, no fatigue, no polydipsia, no polyuria and no weakness. There are no hypoglycemic complications. Symptoms are stable. There are no diabetic complications. Pertinent negatives for diabetic complications include no CVA, PVD or retinopathy. Current diabetic treatment includes oral agent (monotherapy). She is following a generally healthy diet. Meal planning includes avoidance of concentrated sweets and carbohydrate counting. An ACE inhibitor/angiotensin II receptor blocker is being taken.    Lab Results  Component Value Date   NA 140 09/29/2021   K 4.2 09/29/2021   CO2 24 09/29/2021   GLUCOSE 93 09/29/2021   BUN 21 09/29/2021   CREATININE 0.83 09/29/2021   CALCIUM 9.6 09/29/2021   EGFR 70 09/29/2021   GFRNONAA 79 09/13/2019   Lab Results  Component Value Date   CHOL 179 01/29/2022   HDL 93 01/29/2022   LDLCALC 78 01/29/2022   TRIG 35 01/29/2022   CHOLHDL 2.2 01/26/2018   Lab Results  Component Value Date   TSH 1.840 09/13/2019   Lab Results  Component Value Date   HGBA1C 6.4 (H) 01/29/2022   Lab Results  Component Value Date   WBC 3.8 09/29/2021   HGB 11.1  09/29/2021   HCT 36.4 09/29/2021   MCV 78 (L) 09/29/2021    PLT 300 09/29/2021   Lab Results  Component Value Date   ALT 13 09/29/2021   AST 17 09/29/2021   ALKPHOS 51 09/29/2021   BILITOT 0.3 09/29/2021   No results found for: "25OHVITD2", "25OHVITD3", "VD25OH"   Review of Systems  Constitutional:  Negative for fatigue and malaise/fatigue.  HENT:  Negative for congestion and sinus pressure.   Eyes:  Negative for blurred vision.  Respiratory:  Negative for choking, chest tightness, shortness of breath and wheezing.   Cardiovascular:  Negative for chest pain, palpitations, orthopnea, leg swelling and PND.  Gastrointestinal:  Negative for abdominal pain, blood in stool, constipation, hematemesis, hematochezia and melena.  Endocrine: Negative for polydipsia and polyuria.  Genitourinary:  Negative for vaginal bleeding.  Musculoskeletal:  Positive for neck pain.  Neurological:  Positive for headaches. Negative for tingling, weakness, light-headedness and numbness.  Hematological:  Does not bruise/bleed easily.  Psychiatric/Behavioral:  The patient is not nervous/anxious.     Patient Active Problem List   Diagnosis Date Noted   H/O total hysterectomy 05/22/2020   Myocardial infarction 01/24/2018   Coronary artery disease 01/24/2018   Diabetes 01/24/2018   Heart murmur 01/24/2018   Anemia 12/19/2015   Osteoarthritis of left glenohumeral joint 06/20/2015   Type 2 diabetes mellitus without complication, without long-term current use of insulin 06/07/2014   Hypercholesterolemia 06/07/2014   Dyslipidemia 06/07/2014   Hypertension 06/07/2014   Thyrotoxicosis 06/07/2014   Routine general medical examination at a health care facility 06/07/2014   Screening for depression 06/07/2014    Allergies  Allergen Reactions   Ace Inhibitors Swelling   Altace  [Ramipril] Anaphylaxis   Clonidine Swelling   Losartan Potassium Other (See Comments)   Levothyroxine Rash    Past Surgical History:  Procedure Laterality Date   CARDIAC CATHETERIZATION   2005   CATARACT EXTRACTION Left 10/2014   CATARACT EXTRACTION W/PHACO Right 11/19/2014   Procedure: CATARACT EXTRACTION PHACO AND INTRAOCULAR LENS PLACEMENT ([facility]);  Surgeon: Sherald Hess, MD;  Location: [facility];  Service: Ophthalmology;  Laterality: Right;  DIABETIC - oral meds   COLONOSCOPY WITH PROPOFOL N/A 06/30/2016   Procedure: COLONOSCOPY WITH PROPOFOL;  Surgeon: Christena Deem, MD;  Location: [facility] END[facility]COPY;  Service: Endoscopy;  Laterality: N/A;   EYE SURGERY     VAGINAL HYSTERECTOMY  02/10/1980    Social History   Tobacco Use   Smoking status: Former    Packs/day: 0.25    Years: 30.00    Additional pack years: 0.00    Total pack years: 7.50    Types: Cigarettes    Quit date: 2003    Years since quitting: 21.3   Smokeless tobacco: Never   Tobacco comments:    quit 2003  Vaping Use   Vaping Use: Never used  Substance Use Topics   Alcohol use: No    Alcohol/week: 0.0 standard drinks of alcohol   Drug use: No     Medication list has been reviewed and updated.  Current Meds  Medication Sig   aspirin 81 MG tablet Take 1 tablet (81 mg total) by mouth daily.   Calcium Carbonate-Vit D-Min (CALTRATE 600+D PLUS MINERALS) 600-800 MG-UNIT TABS Take 1 tablet by mouth daily.   carvedilol (COREG) 6.25 MG tablet Take 1 tablet by mouth 2 (two) times daily. [place]   docusate sodium (COLACE) 100 MG capsule Take 100 mg by mouth daily as needed for mild  constipation.   ferrous sulfate (FEROSUL) 325 (65 FE) MG tablet Take 1 tablet (325 mg total) by mouth every other day.   hydrochlorothiazide (HYDRODIURIL) 25 MG tablet Take 1 tablet by mouth daily   metFORMIN (GLUCOPHAGE) 500 MG tablet Take 1 tablet (500 mg total) by mouth 2 (two) times daily.   Multiple Vitamins-Minerals (CENTRUM SILVER ULTRA WOMENS) TABS Take 1 tablet by mouth daily.   olmesartan (BENICAR) 5 MG tablet Take 1 tablet by mouth daily   Omega-3 Fatty Acids (FISH OIL) 1000 MG CAPS Take by mouth.    ONETOUCH ULTRA test strip USE TO TEST ONCE DAILY AS DIRECTED   pravastatin (PRAVACHOL) 40 MG tablet Take 1 tablet by mouth daily   SYNTHROID 25 MCG tablet Take 1 tablet by mouth daily. Dr Nelia Shi       06/01/2022   10:28 AM 03/16/2022   10:33 AM 01/29/2022    2:28 PM 01/13/2022    3:01 PM  GAD 7 : Generalized Anxiety Score  Nervous, Anxious, on Edge 0 0 0 0  Control/stop worrying 0 0 0 0  Worry too much - different things 0 0 0 0  Trouble relaxing 0 0 0 0  Restless 0 0 0 0  Easily annoyed or irritable 0 0 0 0  Afraid - awful might happen 0 0 0 0  Total GAD 7 Score 0 0 0 0  Anxiety Difficulty Not difficult at all Not difficult at all Not difficult at all Not difficult at all       06/01/2022   10:27 AM 03/16/2022   10:33 AM 02/27/2022    1:13 PM  Depression screen PHQ 2/9  Decreased Interest 0 0 0  Down, Depressed, Hopeless 0 0 0  PHQ - 2 Score 0 0 0  Altered sleeping 0 0   Tired, decreased energy 0 0   Change in appetite 0 0   Feeling bad or failure about yourself  0 0   Trouble concentrating 0 0   Moving slowly or fidgety/restless 0 0   Suicidal thoughts 0 0   PHQ-9 Score 0 0   Difficult doing work/chores Not difficult at all Not difficult at all     BP Readings from Last 3 Encounters:  06/01/22 120/70  03/16/22 120/76  02/27/22 122/70    Physical Exam Vitals and nursing note reviewed. Exam conducted with a chaperone present.  Constitutional:      General: She is not in acute distress.    Appearance: She is not diaphoretic.  HENT:     Head: Normocephalic and atraumatic.     Right Ear: Tympanic membrane and external ear normal.     Left Ear: Tympanic membrane and external ear normal.     Nose: Nose normal.     Mouth/Throat:     Mouth: Mucous membranes are moist.  Eyes:     General:        Right eye: No discharge.        Left eye: No discharge.     Conjunctiva/sclera: Conjunctivae normal.     Pupils: Pupils are equal, round, and reactive to light.  Neck:      Thyroid: No thyromegaly.     Vascular: No JVD.  Cardiovascular:     Rate and Rhythm: Normal rate and regular rhythm.     Heart sounds: Normal heart sounds. No murmur heard.    No friction rub. No gallop.  Pulmonary:     Effort: Pulmonary effort is normal.  Breath sounds: Normal breath sounds. No wheezing, rhonchi or rales.  Abdominal:     General: Bowel sounds are normal.     Palpations: Abdomen is soft. There is no mass.     Tenderness: There is no abdominal tenderness. There is no guarding.  Musculoskeletal:        General: Normal range of motion.     Cervical back: Normal range of motion and neck supple.  Lymphadenopathy:     Cervical: No cervical adenopathy.  Skin:    General: Skin is warm and dry.  Neurological:     Mental Status: She is alert.     Deep Tendon Reflexes: Reflexes are normal and symmetric.     Wt Readings from Last 3 Encounters:  06/01/22 113 lb (51.3 kg)  03/16/22 116 lb (52.6 kg)  02/27/22 114 lb (51.7 kg)    BP 120/70   Pulse (!) 57   Ht  (1.6 m)   Wt 113 lb (51.3 kg)   SpO2 97%   BMI 20.02 kg/m   Assessment and Plan:  1. Type 2 diabetes mellitus without complication, without long-term current use of insulin Chronic.  Controlled.  Stable.  Continue metformin 500 mg twice a day.  Will recheck patient in 4 months.  Will check A1c and CMP for current status of diabetic related labs. - metFORMIN (GLUCOPHAGE) 500 MG tablet; Take 1 tablet (500 mg total) by mouth 2 (two) times daily.  Dispense: 180 tablet; Refill: 1 - olmesartan (BENICAR) 5 MG tablet; Take 1 tablet by mouth daily  Dispense: 90 tablet; Refill: 1 - HgB A1c - Comprehensive Metabolic Panel (CMET)  2. Iron deficiency anemia, unspecified iron deficiency anemia type Chronic.  Controlled.  Stable.  Continue ferrous sulfate 325 mg every other day.  Will recheck in 4 to 6 months. - ferrous sulfate (FEROSUL) 325 (65 FE) MG tablet; Take 1 tablet (325 mg total) by mouth every other  day.  Dispense: 90 tablet; Refill: 1  3. Essential (primary) hypertension Chronic.  Controlled.  Stable.  Blood pressure 120/70.  Continue hydrochlorothiazide 25 mg once a day.  Will check CMP for electrolytes and GFR. - hydrochlorothiazide (HYDRODIURIL) 25 MG tablet; Take 1 tablet by mouth daily  Dispense: 90 tablet; Refill: 1 - Comprehensive Metabolic Panel (CMET)  4. Hypercholesteremia Chronic.  Controlled.  Stable.  Continue pravastatin 40 mg once a day.  Reviewed previous lipid panel is acceptable and will likely be rechecked next clinic visit. - pravastatin (PRAVACHOL) 40 MG tablet; Take 1 tablet by mouth daily  Dispense: 90 tablet; Refill: 1  5. Dyslipidemia As noted above. - pravastatin (PRAVACHOL) 40 MG tablet; Take 1 tablet by mouth daily  Dispense: 90 tablet; Refill: 1  6. Neck pain Chronic.  Episodic.  Patient has Zoom having discomfort of the left side of her neck over the past 2 weeks..  Patient has similar discomfort and x-ray will be obtained.  In the meantime we will initiate meloxicam 7.5 mg once a day. - meloxicam (MOBIC) 7.5 MG tablet; Take 1 tablet (7.5 mg total) by mouth daily.  Dispense: 30 tablet; Refill: 0  7. Cervicogenic headache This may have resulted in a cervicogenic headache which radiates to the left frontal however this may be new in onset and if pain is unresolved on the meloxicam patient will be given an appointment in 2 weeks that we can further evaluate headaches. - DG Cervical Spine Complete - meloxicam (MOBIC) 7.5 MG tablet; Take 1 tablet (7.5 mg total)  by mouth daily.  Dispense: 30 tablet; Refill: 0  8. Seasonal allergic rhinitis due to pollen Chronic.  Episodic.  Given the frontal sinus is also possibly involved with this we will initiate Nasacort nasal spray 2 sprays in the nose daily. - triamcinolone (NASACORT) 55 MCG/ACT AERO nasal inhaler; Place 2 sprays into the nose daily.  Dispense: 1 each; Refill: 12    Elizabeth Sauer, MD

## 2022-06-02 LAB — COMPREHENSIVE METABOLIC PANEL
ALT: 16 IU/L (ref 0–32)
AST: 23 IU/L (ref 0–40)
Albumin/Globulin Ratio: 1.7 (ref 1.2–2.2)
Albumin: 4.4 g/dL (ref 3.7–4.7)
Alkaline Phosphatase: 41 IU/L — ABNORMAL LOW (ref 44–121)
BUN/Creatinine Ratio: 25 (ref 12–28)
BUN: 18 mg/dL (ref 8–27)
Bilirubin Total: 0.5 mg/dL (ref 0.0–1.2)
CO2: 21 mmol/L (ref 20–29)
Calcium: 9.6 mg/dL (ref 8.7–10.3)
Chloride: 103 mmol/L (ref 96–106)
Creatinine, Ser: 0.73 mg/dL (ref 0.57–1.00)
Globulin, Total: 2.6 g/dL (ref 1.5–4.5)
Glucose: 76 mg/dL (ref 70–99)
Potassium: 4.3 mmol/L (ref 3.5–5.2)
Sodium: 144 mmol/L (ref 134–144)
Total Protein: 7 g/dL (ref 6.0–8.5)
eGFR: 82 mL/min/{1.73_m2} (ref >59)

## 2022-06-02 LAB — HEMOGLOBIN A1C
Est. average glucose Bld gHb Est-mCnc: 146 mg/dL
Hgb A1c MFr Bld: 6.7 % — ABNORMAL HIGH (ref 4.8–5.6)

## 2022-06-15 ENCOUNTER — Ambulatory Visit (INDEPENDENT_AMBULATORY_CARE_PROVIDER_SITE_OTHER): Payer: Medicare HMO | Admitting: Family Medicine

## 2022-06-15 ENCOUNTER — Other Ambulatory Visit: Payer: Self-pay

## 2022-06-15 ENCOUNTER — Encounter: Payer: Self-pay | Admitting: Family Medicine

## 2022-06-15 VITALS — BP 126/88 | HR 60 | Ht 63.0 in | Wt 112.0 lb

## 2022-06-15 DIAGNOSIS — M542 Cervicalgia: Secondary | ICD-10-CM | POA: Diagnosis not present

## 2022-06-15 DIAGNOSIS — G4486 Cervicogenic headache: Secondary | ICD-10-CM

## 2022-06-15 MED ORDER — MELOXICAM 7.5 MG PO TABS: 7.5000 mg | ORAL_TABLET | Freq: Every day | ORAL | 5 refills | Status: DC

## 2022-06-15 NOTE — Progress Notes (Signed)
Date:  06/15/2022   Name:  Vanessa Zamora   DOB:  [birth year]   MRN:  [id number]   Chief Complaint: Neck Pain  Neck Pain  This is a chronic problem. The current episode started more than 1 month ago. The problem occurs intermittently. The problem has been gradually improving. The pain is associated with nothing. The quality of the pain is described as aching. The pain is mild. Nothing aggravates the symptoms. Stiffness is present In the morning. Associated symptoms include weight loss. Pertinent negatives include no chest pain, fever, headaches, numbness, tingling, trouble swallowing or weakness. She has tried NSAIDs for the symptoms.    Lab Results  Component Value Date   NA 144 06/01/2022   K 4.3 06/01/2022   CO2 21 06/01/2022   GLUCOSE 76 06/01/2022   BUN 18 06/01/2022   CREATININE 0.73 06/01/2022   CALCIUM 9.6 06/01/2022   EGFR 82 06/01/2022   GFRNONAA 79 09/13/2019   Lab Results  Component Value Date   CHOL 179 01/29/2022   HDL 93 01/29/2022   LDLCALC 78 01/29/2022   TRIG 35 01/29/2022   CHOLHDL 2.2 01/26/2018   Lab Results  Component Value Date   TSH 1.840 09/13/2019   Lab Results  Component Value Date   HGBA1C 6.7 (H) 06/01/2022   Lab Results  Component Value Date   WBC 3.8 09/29/2021   HGB 11.1 09/29/2021   HCT 36.4 09/29/2021   MCV 78 (L) 09/29/2021   PLT 300 09/29/2021   Lab Results  Component Value Date   ALT 16 06/01/2022   AST 23 06/01/2022   ALKPHOS 41 (L) 06/01/2022   BILITOT 0.5 06/01/2022   No results found for: "25OHVITD2", "25OHVITD3", "VD25OH"   Review of Systems  Constitutional:  Positive for weight loss. Negative for chills, fatigue, fever and unexpected weight change.  HENT:  Negative for congestion, ear discharge, ear pain, rhinorrhea, sinus pressure, sneezing, sore throat and trouble swallowing.   Respiratory:  Negative for cough, shortness of breath, wheezing and stridor.   Cardiovascular:  Positive for leg swelling. Negative for  chest pain and palpitations.  Gastrointestinal:  Negative for abdominal pain, blood in stool, constipation, diarrhea and nausea.  Genitourinary:  Negative for dysuria, flank pain, frequency, hematuria, urgency and vaginal discharge.  Musculoskeletal:  Positive for neck pain and neck stiffness. Negative for arthralgias, back pain and myalgias.  Skin:  Negative for rash.  Neurological:  Negative for dizziness, tingling, weakness, numbness and headaches.  Hematological:  Negative for adenopathy. Does not bruise/bleed easily.  Psychiatric/Behavioral:  Negative for dysphoric mood. The patient is not nervous/anxious.     Patient Active Problem List   Diagnosis Date Noted   H/O total hysterectomy 05/22/2020   Myocardial infarction (HCC) 01/24/2018   Coronary artery disease 01/24/2018   Diabetes (HCC) 01/24/2018   Heart murmur 01/24/2018   Anemia 12/19/2015   Osteoarthritis of left glenohumeral joint 06/20/2015   Type 2 diabetes mellitus without complication, without long-term current use of insulin (HCC) 06/07/2014   Hypercholesterolemia 06/07/2014   Dyslipidemia 06/07/2014   Hypertension 06/07/2014   Thyrotoxicosis 06/07/2014   Routine general medical examination at a health care facility 06/07/2014   Screening for depression 06/07/2014    Allergies  Allergen Reactions   Ace Inhibitors Swelling   Altace  [Ramipril] Anaphylaxis   Clonidine Swelling   Losartan Potassium Other (See Comments)   Levothyroxine Rash    Past Surgical History:  Procedure Laterality Date   CARDIAC CATHETERIZATION  2005   CATARACT EXTRACTION Left 10/2014   CATARACT EXTRACTION W/PHACO Right 11/19/2014   Procedure: CATARACT EXTRACTION PHACO AND INTRAOCULAR LENS PLACEMENT (IOC);  Surgeon: Sherald Hess, MD;  Location: [facility];  Service: Ophthalmology;  Laterality: Right;  DIABETIC - oral meds   COLONOSCOPY WITH PROPOFOL N/A 06/30/2016   Procedure: COLONOSCOPY WITH PROPOFOL;  Surgeon:  Christena Deem, MD;  Location: [facility];  Service: Endoscopy;  Laterality: N/A;   EYE SURGERY     VAGINAL HYSTERECTOMY  02/10/1980    Social History   Tobacco Use   Smoking status: Former    Packs/day: 0.25    Years: 30.00    Additional pack years: 0.00    Total pack years: 7.50    Types: Cigarettes    Quit date: 2003    Years since quitting: 21.3   Smokeless tobacco: Never   Tobacco comments:    quit 2003  Vaping Use   Vaping Use: Never used  Substance Use Topics   Alcohol use: No    Alcohol/week: 0.0 standard drinks of alcohol   Drug use: No     Medication list has been reviewed and updated.  Current Meds  Medication Sig   aspirin 81 MG tablet Take 1 tablet (81 mg total) by mouth daily.   Calcium Carbonate-Vit D-Min (CALTRATE 600+D PLUS MINERALS) 600-800 MG-UNIT TABS Take 1 tablet by mouth daily.   carvedilol (COREG) 6.25 MG tablet Take 1 tablet by mouth 2 (two) times daily. callwood   docusate sodium (COLACE) 100 MG capsule Take 100 mg by mouth daily as needed for mild constipation.   ferrous sulfate (FEROSUL) 325 (65 FE) MG tablet Take 1 tablet (325 mg total) by mouth every other day.   hydrochlorothiazide (HYDRODIURIL) 25 MG tablet Take 1 tablet by mouth daily   meloxicam (MOBIC) 7.5 MG tablet Take 1 tablet (7.5 mg total) by mouth daily.   metFORMIN (GLUCOPHAGE) 500 MG tablet Take 1 tablet (500 mg total) by mouth 2 (two) times daily.   Multiple Vitamins-Minerals (CENTRUM SILVER ULTRA WOMENS) TABS Take 1 tablet by mouth daily.   olmesartan (BENICAR) 5 MG tablet Take 1 tablet by mouth daily   Omega-3 Fatty Acids (FISH OIL) 1000 MG CAPS Take by mouth.   ONETOUCH ULTRA test strip USE TO TEST ONCE DAILY AS DIRECTED   pravastatin (PRAVACHOL) 40 MG tablet Take 1 tablet by mouth daily   SYNTHROID 25 MCG tablet Take 1 tablet by mouth daily. Dr Nelia Shi   triamcinolone (NASACORT) 55 MCG/ACT AERO nasal inhaler Place 2 sprays into the nose daily.       06/15/2022     9:34 AM 06/01/2022   10:28 AM 03/16/2022   10:33 AM 01/29/2022    2:28 PM  GAD 7 : Generalized Anxiety Score  Nervous, Anxious, on Edge 1 0 0 0  Control/stop worrying 2 0 0 0  Worry too much - different things 2 0 0 0  Trouble relaxing 0 0 0 0  Restless 0 0 0 0  Easily annoyed or irritable 0 0 0 0  Afraid - awful might happen 0 0 0 0  Total GAD 7 Score 5 0 0 0  Anxiety Difficulty Somewhat difficult Not difficult at all Not difficult at all Not difficult at all       06/15/2022    9:33 AM 06/01/2022   10:27 AM 03/16/2022   10:33 AM  Depression screen PHQ 2/9  Decreased Interest 0 0 0  Down, Depressed,  Hopeless 0 0 0  PHQ - 2 Score 0 0 0  Altered sleeping 0 0 0  Tired, decreased energy 0 0 0  Change in appetite 0 0 0  Feeling bad or failure about yourself  0 0 0  Trouble concentrating 0 0 0  Moving slowly or fidgety/restless 0 0 0  Suicidal thoughts 0 0 0  PHQ-9 Score 0 0 0  Difficult doing work/chores Not difficult at all Not difficult at all Not difficult at all    BP Readings from Last 3 Encounters:  06/15/22 126/88  06/01/22 120/70  03/16/22 120/76    Physical Exam Vitals and nursing note reviewed. Exam conducted with a chaperone present.  Constitutional:      General: She is not in acute distress.    Appearance: She is not diaphoretic.  HENT:     Head: Normocephalic and atraumatic.     Right Ear: Tympanic membrane and external ear normal.     Left Ear: Tympanic membrane and external ear normal.     Nose: Nose normal.  Eyes:     General:        Right eye: No discharge.        Left eye: No discharge.     Conjunctiva/sclera: Conjunctivae normal.     Pupils: Pupils are equal, round, and reactive to light.  Neck:     Thyroid: No thyromegaly.     Vascular: No JVD.  Cardiovascular:     Rate and Rhythm: Normal rate and regular rhythm.     Heart sounds: Normal heart sounds. No murmur heard.    No friction rub. No gallop.  Pulmonary:     Effort: Pulmonary effort is  normal.     Breath sounds: Normal breath sounds.  Abdominal:     General: Bowel sounds are normal.     Palpations: Abdomen is soft. There is no mass.     Tenderness: There is no abdominal tenderness. There is no guarding or rebound.  Musculoskeletal:        General: No swelling or tenderness. Normal range of motion.     Cervical back: Normal range of motion and neck supple. Spasms present. No swelling, tenderness or bony tenderness.  Lymphadenopathy:     Cervical: No cervical adenopathy.  Skin:    General: Skin is warm and dry.     Capillary Refill: Capillary refill takes less than 2 seconds.     Findings: No bruising, erythema or lesion.  Neurological:     Mental Status: She is alert.     Deep Tendon Reflexes: Reflexes are normal and symmetric.     Wt Readings from Last 3 Encounters:  06/15/22 112 lb (50.8 kg)  06/01/22 113 lb (51.3 kg)  03/16/22 116 lb (52.6 kg)    BP 126/88   Pulse 60   Ht 5\' 3"  (1.6 m)   Wt 112 lb (50.8 kg)   SpO2 98%   BMI 19.84 kg/m   Assessment and Plan:  1. Neck pain New onset.  Improved.  Stable.  Since initiating meloxicam 7.5 mg once a day patient has Describes her neck pain and cervicogenic headaches.  We will continue meloxicam 7.5 once a day and may add Tylenol on a as needed basis if there is breakthrough pain.  If pain resumes or becomes more intense with invited patient to return and we will proceed with further evaluation. - meloxicam (MOBIC) 7.5 MG tablet; Take 1 tablet (7.5 mg total) by mouth daily.  Dispense:  30 tablet; Refill: 5  2. Cervicogenic headache As noted above - meloxicam (MOBIC) 7.5 MG tablet; Take 1 tablet (7.5 mg total) by mouth daily.  Dispense: 30 tablet; Refill: 5    Elizabeth Sauer, MD

## 2022-06-28 ENCOUNTER — Other Ambulatory Visit: Payer: Self-pay | Admitting: Family Medicine

## 2022-06-28 DIAGNOSIS — G4486 Cervicogenic headache: Secondary | ICD-10-CM

## 2022-06-28 DIAGNOSIS — M542 Cervicalgia: Secondary | ICD-10-CM

## 2022-08-25 ENCOUNTER — Other Ambulatory Visit: Payer: Self-pay

## 2022-08-25 ENCOUNTER — Telehealth: Payer: Self-pay

## 2022-08-25 ENCOUNTER — Telehealth: Payer: Self-pay | Admitting: Family Medicine

## 2022-08-25 DIAGNOSIS — E0590 Thyrotoxicosis, unspecified without thyrotoxic crisis or storm: Secondary | ICD-10-CM

## 2022-08-25 NOTE — Telephone Encounter (Signed)
Pt called requesting ref to endo, former pt of [facility]- he is retiring

## 2022-08-25 NOTE — Progress Notes (Signed)
 Ref placed to endo

## 2022-08-25 NOTE — Telephone Encounter (Signed)
Copied from CRM [id number]. Topic: General - Other >> Aug 25, 2022  9:21 AM Turkey B wrote: Reason for CRM: pt called in wants call back about transfering from Dr Johny Chess.

## 2022-09-01 NOTE — Telephone Encounter (Signed)
Pt stated she received a call from [facility] regarding her referral. She wants to let Delice Bison know they advised her that they don't have her records. However, they scheduled her for an appointment in September and they advised her she can bring her records with her to her appointment.  Pt would like to pick up her records. Is requesting a callback from [place].    Please advise.

## 2022-09-02 ENCOUNTER — Telehealth: Payer: Self-pay

## 2022-09-02 NOTE — Telephone Encounter (Signed)
Spoke to receptionist at Dr [facility] office. She is going to place the referral to Janey Genta at [facility] and print off pt's records for her to take with her. I called pt back and explained this to her.

## 2022-10-06 ENCOUNTER — Encounter: Payer: Self-pay | Admitting: Family Medicine

## 2022-10-06 ENCOUNTER — Ambulatory Visit (INDEPENDENT_AMBULATORY_CARE_PROVIDER_SITE_OTHER): Payer: Medicare HMO | Admitting: Family Medicine

## 2022-10-06 VITALS — BP 128/78 | HR 76 | Ht 63.0 in | Wt 116.0 lb

## 2022-10-06 DIAGNOSIS — E119 Type 2 diabetes mellitus without complications: Secondary | ICD-10-CM | POA: Diagnosis not present

## 2022-10-06 DIAGNOSIS — Z7984 Long term (current) use of oral hypoglycemic drugs: Secondary | ICD-10-CM | POA: Diagnosis not present

## 2022-10-06 DIAGNOSIS — Z23 Encounter for immunization: Secondary | ICD-10-CM

## 2022-10-06 MED ORDER — METFORMIN HCL 500 MG PO TABS: 500.0000 mg | ORAL_TABLET | Freq: Two times a day (BID) | ORAL | 1 refills | Status: DC

## 2022-10-06 NOTE — Progress Notes (Signed)
Date:  10/06/2022   Name:  Vanessa Zamora   DOB:  October 14, 1940   MRN:  [id number]   Chief Complaint: Diabetes and Flu Vaccine  Diabetes She presents for her follow-up diabetic visit. She has type 2 diabetes mellitus. Her disease course has been stable. There are no hypoglycemic associated symptoms. Pertinent negatives for hypoglycemia include no dizziness, headaches or nervousness/anxiousness. There are no diabetic associated symptoms. Pertinent negatives for diabetes include no fatigue and no weakness. There are no hypoglycemic complications. Symptoms are stable. Diabetic complications include heart disease. Pertinent negatives for diabetic complications include no CVA. Risk factors for coronary artery disease include dyslipidemia and hypertension. Current diabetic treatment includes oral agent (monotherapy) (metformen). Her weight is stable. She is following a generally healthy diet. Meal planning includes avoidance of concentrated sweets and carbohydrate counting.    Lab Results  Component Value Date   NA 144 06/01/2022   K 4.3 06/01/2022   CO2 21 06/01/2022   GLUCOSE 76 06/01/2022   BUN 18 06/01/2022   CREATININE 0.73 06/01/2022   CALCIUM 9.6 06/01/2022   EGFR 82 06/01/2022   GFRNONAA 79 09/13/2019   Lab Results  Component Value Date   CHOL 179 01/29/2022   HDL 93 01/29/2022   LDLCALC 78 01/29/2022   TRIG 35 01/29/2022   CHOLHDL 2.2 01/26/2018   Lab Results  Component Value Date   TSH 1.840 09/13/2019   Lab Results  Component Value Date   HGBA1C 6.7 (H) 06/01/2022   Lab Results  Component Value Date   WBC 3.8 09/29/2021   HGB 11.1 09/29/2021   HCT 36.4 09/29/2021   MCV 78 (L) 09/29/2021   PLT 300 09/29/2021   Lab Results  Component Value Date   ALT 16 06/01/2022   AST 23 06/01/2022   ALKPHOS 41 (L) 06/01/2022   BILITOT 0.5 06/01/2022   No results found for: "25OHVITD2", "25OHVITD3", "VD25OH"   Review of Systems  Constitutional: Negative.  Negative for  chills, fatigue and fever.  HENT:  Positive for congestion. Negative for rhinorrhea, sinus pressure, sneezing and sore throat.   Respiratory:  Negative for shortness of breath and wheezing.   Gastrointestinal:  Negative for abdominal pain and blood in stool.  Genitourinary:  Negative for dysuria, flank pain, frequency, hematuria, urgency and vaginal discharge.  Musculoskeletal:  Negative for arthralgias, back pain and myalgias.  Skin:  Negative for rash.  Neurological:  Negative for dizziness, weakness and headaches.  Hematological:  Negative for adenopathy. Does not bruise/bleed easily.  Psychiatric/Behavioral:  Negative for dysphoric mood. The patient is not nervous/anxious.     Patient Active Problem List   Diagnosis Date Noted   H/O total hysterectomy 05/22/2020   Myocardial infarction (HCC) 01/24/2018   Coronary artery disease 01/24/2018   Diabetes (HCC) 01/24/2018   Heart murmur 01/24/2018   Anemia 12/19/2015   Osteoarthritis of left glenohumeral joint 06/20/2015   Type 2 diabetes mellitus without complication, without long-term current use of insulin (HCC) 06/07/2014   Hypercholesterolemia 06/07/2014   Dyslipidemia 06/07/2014   Hypertension 06/07/2014   Thyrotoxicosis 06/07/2014   Routine general medical examination at a health care facility 06/07/2014   Screening for depression 06/07/2014    Allergies  Allergen Reactions   Ace Inhibitors Swelling   Altace  [Ramipril] Anaphylaxis   Clonidine Swelling   Losartan Potassium Other (See Comments)   Levothyroxine Rash    Past Surgical History:  Procedure Laterality Date   CARDIAC CATHETERIZATION  2005   CATARACT EXTRACTION  Left 10/2014   CATARACT EXTRACTION W/PHACO Right 11/19/2014   Procedure: CATARACT EXTRACTION PHACO AND INTRAOCULAR LENS PLACEMENT (IOC);  Surgeon: Sherald Hess, MD;  Location: [facility];  Service: Ophthalmology;  Laterality: Right;  DIABETIC - oral meds   COLONOSCOPY WITH PROPOFOL  N/A 06/30/2016   Procedure: COLONOSCOPY WITH PROPOFOL;  Surgeon: Christena Deem, MD;  Location: [facility] END[facility]COPY;  Service: Endoscopy;  Laterality: N/A;   EYE SURGERY     VAGINAL HYSTERECTOMY  02/10/1980    Social History   Tobacco Use   Smoking status: Former    Current packs/day: 0.00    Average packs/day: 0.3 packs/day for 30.0 years (7.5 ttl pk-yrs)    Types: Cigarettes    Start date: 16    Quit date: 2003    Years since quitting: 21.6   Smokeless tobacco: Never   Tobacco comments:    quit 2003  Vaping Use   Vaping status: Never Used  Substance Use Topics   Alcohol use: No    Alcohol/week: 0.0 standard drinks of alcohol   Drug use: No     Medication list has been reviewed and updated.  Current Meds  Medication Sig   aspirin 81 MG tablet Take 1 tablet (81 mg total) by mouth daily.   Calcium Carbonate-Vit D-Min (CALTRATE 600+D PLUS MINERALS) 600-800 MG-UNIT TABS Take 1 tablet by mouth daily.   carvedilol (COREG) 6.25 MG tablet Take 1 tablet by mouth 2 (two) times daily. [place]wood   docusate sodium (COLACE) 100 MG capsule Take 100 mg by mouth daily as needed for mild constipation.   ferrous sulfate (FEROSUL) 325 (65 FE) MG tablet Take 1 tablet (325 mg total) by mouth every other day.   hydrochlorothiazide (HYDRODIURIL) 25 MG tablet Take 1 tablet by mouth daily   metFORMIN (GLUCOPHAGE) 500 MG tablet Take 1 tablet (500 mg total) by mouth 2 (two) times daily.   Multiple Vitamins-Minerals (CENTRUM SILVER ULTRA WOMENS) TABS Take 1 tablet by mouth daily.   olmesartan (BENICAR) 5 MG tablet Take 1 tablet by mouth daily   Omega-3 Fatty Acids (FISH OIL) 1000 MG CAPS Take by mouth.   ONETOUCH ULTRA test strip USE TO TEST ONCE DAILY AS DIRECTED   pravastatin (PRAVACHOL) 40 MG tablet Take 1 tablet by mouth daily   SYNTHROID 25 MCG tablet Take 1 tablet by mouth daily. Cassandra   [DISCONTINUED] meloxicam (MOBIC) 7.5 MG tablet TAKE 1 TABLET(7.5 MG) BY MOUTH DAILY   [DISCONTINUED]  triamcinolone (NASACORT) 55 MCG/ACT AERO nasal inhaler Place 2 sprays into the nose daily.       10/06/2022   10:59 AM 06/15/2022    9:34 AM 06/01/2022   10:28 AM 03/16/2022   10:33 AM  GAD 7 : Generalized Anxiety Score  Nervous, Anxious, on Edge 0 1 0 0  Control/stop worrying 0 2 0 0  Worry too much - different things 0 2 0 0  Trouble relaxing 0 0 0 0  Restless 0 0 0 0  Easily annoyed or irritable 0 0 0 0  Afraid - awful might happen 0 0 0 0  Total GAD 7 Score 0 5 0 0  Anxiety Difficulty Not difficult at all Somewhat difficult Not difficult at all Not difficult at all       10/06/2022   10:59 AM 06/15/2022    9:33 AM 06/01/2022   10:27 AM  Depression screen PHQ 2/9  Decreased Interest 0 0 0  Down, Depressed, Hopeless 0 0 0  PHQ -  2 Score 0 0 0  Altered sleeping 0 0 0  Tired, decreased energy 0 0 0  Change in appetite 0 0 0  Feeling bad or failure about yourself  0 0 0  Trouble concentrating 0 0 0  Moving slowly or fidgety/restless 0 0 0  Suicidal thoughts 0 0 0  PHQ-9 Score 0 0 0  Difficult doing work/chores Not difficult at all Not difficult at all Not difficult at all    BP Readings from Last 3 Encounters:  10/06/22 128/78  06/15/22 126/88  06/01/22 120/70    Physical Exam Vitals and nursing note reviewed. Exam conducted with a chaperone present.  Constitutional:      General: She is not in acute distress.    Appearance: She is not diaphoretic.  HENT:     Head: Normocephalic and atraumatic.     Right Ear: Tympanic membrane and external ear normal.     Left Ear: Tympanic membrane and external ear normal.     Nose: Nose normal.     Mouth/Throat:     Mouth: Mucous membranes are moist.  Eyes:     General:        Right eye: No discharge.        Left eye: No discharge.     Conjunctiva/sclera: Conjunctivae normal.     Pupils: Pupils are equal, round, and reactive to light.  Neck:     Thyroid: No thyromegaly.     Vascular: No JVD.  Cardiovascular:     Rate and  Rhythm: Normal rate and regular rhythm.     Heart sounds: Normal heart sounds. No murmur heard.    No friction rub. No gallop.  Pulmonary:     Effort: Pulmonary effort is normal.     Breath sounds: No wheezing, rhonchi or rales.  Abdominal:     General: Bowel sounds are normal.     Palpations: Abdomen is soft. There is no mass.     Tenderness: There is no abdominal tenderness. There is no guarding.  Musculoskeletal:        General: Normal range of motion.     Cervical back: Normal range of motion and neck supple.  Lymphadenopathy:     Cervical: No cervical adenopathy.  Skin:    General: Skin is warm and dry.  Neurological:     Mental Status: She is alert.     Deep Tendon Reflexes: Reflexes are normal and symmetric.     Wt Readings from Last 3 Encounters:  10/06/22 116 lb (52.6 kg)  06/15/22 112 lb (50.8 kg)  06/01/22 113 lb (51.3 kg)    BP 128/78   Pulse 76   Ht 5\' 3"  (1.6 m)   Wt 116 lb (52.6 kg)   SpO2 97%   BMI 20.55 kg/m   Assessment and Plan: 1. Type 2 diabetes mellitus without complication, without long-term current use of insulin (HCC) Chronic.  Controlled.  Stable.  Asymptomatic.  Tolerating medication well.  Will check A1c for current level of control and microalbuminuria for status of renal function.  Will refill metformin 500 mg twice a day.  Recheck patient in 6 months. - HgB A1c - Microalbumin / creatinine urine ratio - metFORMIN (GLUCOPHAGE) 500 MG tablet; Take 1 tablet (500 mg total) by mouth 2 (two) times daily.  Dispense: 180 tablet; Refill: 1  2. Encounter for immunization discussed and administered - Flu Vaccine Trivalent High Dose (Fluad)     Elizabeth Sauer, MD

## 2022-10-07 LAB — MICROALBUMIN / CREATININE URINE RATIO
Creatinine, Urine: 95.8 mg/dL
Microalb/Creat Ratio: 17 mg/g creat (ref 0–29)
Microalbumin, Urine: 16.4 ug/mL

## 2022-10-07 LAB — HEMOGLOBIN A1C
Est. average glucose Bld gHb Est-mCnc: 137 mg/dL
Hgb A1c MFr Bld: 6.4 % — ABNORMAL HIGH (ref 4.8–5.6)

## 2022-11-07 ENCOUNTER — Ambulatory Visit (INDEPENDENT_AMBULATORY_CARE_PROVIDER_SITE_OTHER): Payer: Medicare HMO

## 2022-11-07 ENCOUNTER — Encounter: Payer: Self-pay | Admitting: Emergency Medicine

## 2022-11-07 ENCOUNTER — Ambulatory Visit
Admission: EM | Admit: 2022-11-07 | Discharge: 2022-11-07 | Disposition: A | Discharge status: Home / Self Care | Payer: Medicare HMO | Attending: Internal Medicine | Admitting: Internal Medicine

## 2022-11-07 DIAGNOSIS — S62352A Nondisplaced fracture of shaft of third metacarpal bone, right hand, initial encounter for closed fracture: Secondary | ICD-10-CM

## 2022-11-07 NOTE — ED Provider Notes (Signed)
[facility]    CSN: [id number] Arrival date & time: 11/07/22  1422      History   Chief Complaint Chief Complaint  Patient presents with   Hand Pain    right    HPI Vanessa Zamora is a 82 y.o. female.   82 year old female who presents to urgent care with complaints of pain and swelling in the right posterior hand.  She reports on Thursday that she tripped and fell forward hitting the back of her hand on a hard table.  She did not realize she had a injury until her hand started swelling later that day.  It has continued to swell and is bruising now therefore she presented for further evaluation.  She denies any pain in the area.  She has not been using anything on the area since it occurred.  She does not recall having any history of fractures in the past.   Hand Pain Pertinent negatives include no chest pain, no abdominal pain and no shortness of breath.    Past Medical History:  Diagnosis Date   Arthritis    shoulders, knees right side   Coronary artery disease    Diabetes mellitus without complication (HCC)    Hypercholesteremia    Hypertension    Myocardial infarction (HCC) 2005   Seasonal allergies    Thyroid goiter    Wears dentures    partial lower    Patient Active Problem List   Diagnosis Date Noted   H/O total hysterectomy 05/22/2020   Myocardial infarction (HCC) 01/24/2018   Coronary artery disease 01/24/2018   Diabetes (HCC) 01/24/2018   Heart murmur 01/24/2018   Anemia 12/19/2015   Osteoarthritis of left glenohumeral joint 06/20/2015   Type 2 diabetes mellitus without complication, without long-term current use of insulin (HCC) 06/07/2014   Hypercholesterolemia 06/07/2014   Dyslipidemia 06/07/2014   Hypertension 06/07/2014   Thyrotoxicosis 06/07/2014   Routine general medical examination at a health care facility 06/07/2014   Screening for depression 06/07/2014    Past Surgical History:  Procedure Laterality Date   CARDIAC  CATHETERIZATION  2005   CATARACT EXTRACTION Left 10/2014   CATARACT EXTRACTION W/PHACO Right 11/19/2014   Procedure: CATARACT EXTRACTION PHACO AND INTRAOCULAR LENS PLACEMENT (IOC);  Surgeon: Sherald Hess, MD;  Location: [facility];  Service: Ophthalmology;  Laterality: Right;  DIABETIC - oral meds   COLONOSCOPY WITH PROPOFOL N/A 06/30/2016   Procedure: COLONOSCOPY WITH PROPOFOL;  Surgeon: Christena Deem, MD;  Location: [facility];  Service: Endoscopy;  Laterality: N/A;   EYE SURGERY     VAGINAL HYSTERECTOMY  02/10/1980    OB History   No obstetric history on file.      Home Medications    Prior to Admission medications   Medication Sig Start Date End Date Taking? Authorizing Provider  aspirin 81 MG tablet Take 1 tablet (81 mg total) by mouth daily. 07/28/17   Duanne Limerick, MD  Calcium Carbonate-Vit D-Min (CALTRATE 600+D PLUS MINERALS) 600-800 MG-UNIT TABS Take 1 tablet by mouth daily.    [provider]  carvedilol (COREG) 6.25 MG tablet Take 1 tablet by mouth 2 (two) times daily. [place] 10/23/20   [provider]  docusate sodium (COLACE) 100 MG capsule Take 100 mg by mouth daily as needed for mild constipation.    [provider]  ferrous sulfate (FEROSUL) 325 (65 FE) MG tablet Take 1 tablet (325 mg total) by mouth every other day. 06/01/22   Yetta Barre,  Deanna C, MD  hydrochlorothiazide (HYDRODIURIL) 25 MG tablet Take 1 tablet by mouth daily 06/01/22   Duanne Limerick, MD  metFORMIN (GLUCOPHAGE) 500 MG tablet Take 1 tablet (500 mg total) by mouth 2 (two) times daily. 10/06/22   Duanne Limerick, MD  Multiple Vitamins-Minerals (CENTRUM SILVER ULTRA WOMENS) TABS Take 1 tablet by mouth daily.    [provider]  olmesartan (BENICAR) 5 MG tablet Take 1 tablet by mouth daily 06/01/22   Duanne Limerick, MD  Omega-3 Fatty Acids (FISH OIL) 1000 MG CAPS Take by mouth.    [provider]  [facility] ULTRA test strip USE TO TEST ONCE  DAILY AS DIRECTED 05/28/22   Duanne Limerick, MD  pravastatin (PRAVACHOL) 40 MG tablet Take 1 tablet by mouth daily 06/01/22   Duanne Limerick, MD  SYNTHROID 25 MCG tablet Take 1 tablet by mouth daily. Cassandra 09/12/18   [provider]    Family History Family History  Problem Relation Age of Onset   Stroke Mother    Aneurysm Sister    Heart disease Brother    Hypertension Brother    Goiter Maternal Grandmother    Diabetes Maternal Grandmother    COPD Brother    Hypertension Brother    Breast cancer Neg Hx     Social History Social History   Tobacco Use   Smoking status: Former    Current packs/day: 0.00    Average packs/day: 0.3 packs/day for 30.0 years (7.5 ttl pk-yrs)    Types: Cigarettes    Start date: 82    Quit date: 2003    Years since quitting: 21.7   Smokeless tobacco: Never   Tobacco comments:    quit 2003  Vaping Use   Vaping status: Never Used  Substance Use Topics   Alcohol use: No    Alcohol/week: 0.0 standard drinks of alcohol   Drug use: No     Allergies   Ace inhibitors, Altace  [ramipril], Clonidine, Losartan potassium, and Levothyroxine   Review of Systems Review of Systems  Constitutional:  Negative for chills and fever.  HENT:  Negative for ear pain and sore throat.   Eyes:  Negative for pain and visual disturbance.  Respiratory:  Negative for cough and shortness of breath.   Cardiovascular:  Negative for chest pain and palpitations.  Gastrointestinal:  Negative for abdominal pain and vomiting.  Genitourinary:  Negative for dysuria and hematuria.  Musculoskeletal:  Negative for arthralgias and back pain.       Right hand pain, swelling and ecchymosis   Skin:  Negative for color change and rash.  Neurological:  Negative for seizures and syncope.  All other systems reviewed and are negative.    Physical Exam Triage Vital Signs ED Triage Vitals  Encounter Vitals Group     BP 11/07/22 1435 (!) 178/82     Systolic BP  Percentile --      Diastolic BP Percentile --      Pulse Rate 11/07/22 1435 68     Resp 11/07/22 1435 14     Temp 11/07/22 1435 98.8 F (37.1 C)     Temp Source 11/07/22 1435 Oral     SpO2 11/07/22 1435 100 %     Weight 11/07/22 1433 115 lb 15.4 oz (52.6 kg)     Height 11/07/22 1433 5\' 3"  (1.6 m)     Head Circumference --      Peak Flow --      Pain  Score 11/07/22 1433 3     Pain Loc --      Pain Education --      Exclude from Growth Chart --    No data found.  Updated Vital Signs BP (!) 178/82 (BP Location: Left Arm)   Pulse 68   Temp 98.8 F (37.1 C) (Oral)   Resp 14   Ht 5\' 3"  (1.6 m)   Wt 115 lb 15.4 oz (52.6 kg)   SpO2 100%   BMI 20.54 kg/m   Visual Acuity Right Eye Distance:   Left Eye Distance:   Bilateral Distance:    Right Eye Near:   Left Eye Near:    Bilateral Near:     Physical Exam Vitals and nursing note reviewed.  Constitutional:      General: She is not in acute distress.    Appearance: She is well-developed.  HENT:     Head: Normocephalic and atraumatic.  Eyes:     Conjunctiva/sclera: Conjunctivae normal.  Cardiovascular:     Rate and Rhythm: Normal rate and regular rhythm.     Heart sounds: No murmur heard. Pulmonary:     Effort: Pulmonary effort is normal. No respiratory distress.     Breath sounds: Normal breath sounds.  Abdominal:     Palpations: Abdomen is soft.     Tenderness: There is no abdominal tenderness.  Musculoskeletal:        General: No swelling.     Right hand: Swelling and tenderness present. Decreased strength. Normal capillary refill. Normal pulse.     Cervical back: Neck supple.     Comments: Palm of the hand with ecchymosis, posterior hand with swelling and ecchymosis, no deformity, tender to the touch  Skin:    General: Skin is warm and dry.     Capillary Refill: Capillary refill takes less than 2 seconds.  Neurological:     Mental Status: She is alert.  Psychiatric:        Mood and Affect: Mood normal.      UC Treatments / Results  Labs (all labs ordered are listed, but only abnormal results are displayed) Labs Reviewed - No data to display  EKG   Radiology No results found.  Procedures Procedures (including critical care time)  Medications Ordered in UC Medications - No data to display  Initial Impression / Assessment and Plan / UC Course  I have reviewed the triage vital signs and the nursing notes.  Pertinent labs & imaging results that were available during my care of the patient were reviewed by me and considered in my medical decision making (see chart for details).     Nondisplaced fracture of shaft of third metacarpal bone, right hand, initial encounter for closed fracture X-ray shows a fracture of the third metacarpal. Keep the splint in place until you follow-up with orthopedics. Call [facility] on Monday to schedule an appointment to be seen for right hand fracture. May use Tylenol for discomfort if needed. Try to keep arm elevated at or above the level of the heart. May use ice for swelling. Return to urgent care if symptoms worsen.   Final Clinical Impressions(s) / UC Diagnoses   Final diagnoses:  None   Discharge Instructions   None    ED Prescriptions   None    PDMP not reviewed this encounter.   Landis Martins, [place] 11/07/22 1642

## 2022-11-07 NOTE — ED Triage Notes (Signed)
Patient states that she hit the top of her right hand on the table on Thursday.  Patient reports pain along her knuckles of her right hand.

## 2022-11-07 NOTE — Discharge Instructions (Addendum)
X-ray shows a fracture of the third metacarpal. Keep the splint in place until you follow-up with orthopedics. Call [facility] on Monday to schedule an appointment to be seen for right hand fracture. May use Tylenol for discomfort if needed. Try to keep arm elevated at or above the level of the heart. May use ice for swelling. Return to urgent care if symptoms worsen.

## 2022-11-20 ENCOUNTER — Other Ambulatory Visit: Payer: Self-pay

## 2022-11-20 DIAGNOSIS — E119 Type 2 diabetes mellitus without complications: Secondary | ICD-10-CM

## 2022-11-20 MED ORDER — ONETOUCH ULTRA VI STRP: ORAL_STRIP | 1 refills | Status: AC

## 2022-11-23 ENCOUNTER — Other Ambulatory Visit: Payer: Self-pay | Admitting: Family Medicine

## 2022-11-23 DIAGNOSIS — E785 Hyperlipidemia, unspecified: Secondary | ICD-10-CM

## 2022-11-23 DIAGNOSIS — I10 Essential (primary) hypertension: Secondary | ICD-10-CM

## 2022-11-23 DIAGNOSIS — E7800 Pure hypercholesterolemia, unspecified: Secondary | ICD-10-CM

## 2022-11-24 ENCOUNTER — Other Ambulatory Visit: Payer: Self-pay | Admitting: Family Medicine

## 2022-11-24 ENCOUNTER — Other Ambulatory Visit: Payer: Self-pay

## 2022-11-24 DIAGNOSIS — E119 Type 2 diabetes mellitus without complications: Secondary | ICD-10-CM

## 2022-11-24 MED ORDER — OLMESARTAN MEDOXOMIL 5 MG PO TABS: ORAL_TABLET | ORAL | 0 refills | Status: DC

## 2022-11-25 NOTE — Telephone Encounter (Signed)
Request is too soon, last refill 10/06/22 for 90 and 1 refill.  Requested Prescriptions  Pending Prescriptions Disp Refills   metFORMIN (GLUCOPHAGE) 500 MG tablet [Pharmacy Med Name: METFORMIN 500MG  TABLETS] 180 tablet 1    Sig: TAKE 1 TABLET(500 MG) BY MOUTH TWICE DAILY     Endocrinology:  Diabetes - Biguanides Failed - 11/24/2022  1:16 PM      Failed - B12 Level in normal range and within 720 days    No results found for: "VITAMINB12"       Failed - CBC within normal limits and completed in the last 12 months    WBC  Date Value Ref Range Status  09/29/2021 3.8 3.4 - 10.8 x10E3/uL Final   RBC  Date Value Ref Range Status  09/29/2021 4.66 3.77 - 5.28 x10E6/uL Final  09/29/2021 4.66 3.87 - 5.11 Final   Hemoglobin  Date Value Ref Range Status  09/29/2021 11.1 11.1 - 15.9 g/dL Final   Hematocrit  Date Value Ref Range Status  09/29/2021 36.4 34.0 - 46.6 % Final   MCHC  Date Value Ref Range Status  09/29/2021 30.5 (L) 31.5 - 35.7 g/dL Final   [facility]  Date Value Ref Range Status  09/29/2021 23.8 (L) 26.6 - 33.0 pg Final   MCV  Date Value Ref Range Status  09/29/2021 78 (L) 79 - 97 fL Final   No results found for: "PLTCOUNTKUC", "LABPLAT", "POCPLA" RDW  Date Value Ref Range Status  09/29/2021 15.1 11.7 - 15.4 % Final         Passed - Cr in normal range and within 360 days    Creatinine, Ser  Date Value Ref Range Status  06/01/2022 0.73 0.57 - 1.00 mg/dL Final         Passed - HBA1C is between 0 and 7.9 and within 180 days    Hemoglobin A1C  Date Value Ref Range Status  09/29/2021 6.5  Final   Hgb A1c MFr Bld  Date Value Ref Range Status  10/06/2022 6.4 (H) 4.8 - 5.6 % Final    Comment:             Prediabetes: 5.7 - 6.4          Diabetes: >6.4          Glycemic control for adults with diabetes: <7.0          Passed - eGFR in normal range and within 360 days    GFR calc Af Amer  Date Value Ref Range Status  09/13/2019 91 >59 mL/min/1.73 Final    Comment:     **[facility] currently reports eGFR in compliance with the current**   recommendations of the [employer]. [facility] will   update reporting as new guidelines are published from the NKF-ASN   Task force.    GFR calc non Af Amer  Date Value Ref Range Status  09/13/2019 79 >59 mL/min/1.73 Final   eGFR  Date Value Ref Range Status  06/01/2022 82 >59 mL/min/1.73 Final         Passed - Valid encounter within last 6 months    Recent Outpatient Visits           1 month ago Type 2 diabetes mellitus without complication, without long-term current use of insulin (HCC)   [place] [facility], [facility]   5 months ago Neck pain   [place] [facility] at [facility]  Duanne Limerick, MD   5 months ago Type 2 diabetes mellitus without complication, without long-term current use of insulin (HCC)   [place] [facility], [facility]   8 months ago Primary osteoarthritis of both shoulders   [facility], MD   10 months ago Type 2 diabetes mellitus without complication, without long-term current use of insulin [facility])   [place] [facility], MD       Future Appointments             In 2 months Duanne Limerick, MD [facility] at [facility], [facility]

## 2022-12-04 LAB — HM DIABETES EYE EXAM

## 2022-12-21 ENCOUNTER — Telehealth: Payer: Self-pay | Admitting: Family Medicine

## 2022-12-21 NOTE — Telephone Encounter (Signed)
Copied from CRM [id number]. Topic: General - Other >> Dec 21, 2022  4:00 PM Turkey B wrote: Reason for CRM: christina called from [facility] to have  order  for bone density test for pt. Please cb pt when orders are done

## 2022-12-23 ENCOUNTER — Other Ambulatory Visit: Payer: Self-pay

## 2022-12-23 ENCOUNTER — Telehealth: Payer: Self-pay | Admitting: Family Medicine

## 2022-12-23 DIAGNOSIS — Z13820 Encounter for screening for osteoporosis: Secondary | ICD-10-CM

## 2022-12-23 NOTE — Telephone Encounter (Signed)
Bone Density ordered.

## 2022-12-23 NOTE — Telephone Encounter (Signed)
Vanessa Zamora with Vanessa Zamora is calling in checking on the status of the bone density test orders she requested. Vanessa Zamora would like to know when she could expect those orders to be put in.

## 2023-02-05 ENCOUNTER — Encounter: Payer: Self-pay | Admitting: Family Medicine

## 2023-02-05 ENCOUNTER — Ambulatory Visit (INDEPENDENT_AMBULATORY_CARE_PROVIDER_SITE_OTHER): Payer: Medicare HMO | Admitting: Family Medicine

## 2023-02-05 VITALS — BP 126/78 | HR 60 | Ht 63.0 in | Wt 117.0 lb

## 2023-02-05 DIAGNOSIS — E7800 Pure hypercholesterolemia, unspecified: Secondary | ICD-10-CM

## 2023-02-05 DIAGNOSIS — E119 Type 2 diabetes mellitus without complications: Secondary | ICD-10-CM

## 2023-02-05 DIAGNOSIS — I10 Essential (primary) hypertension: Secondary | ICD-10-CM

## 2023-02-05 DIAGNOSIS — Z7984 Long term (current) use of oral hypoglycemic drugs: Secondary | ICD-10-CM

## 2023-02-05 DIAGNOSIS — Z1231 Encounter for screening mammogram for malignant neoplasm of breast: Secondary | ICD-10-CM | POA: Diagnosis not present

## 2023-02-05 NOTE — Progress Notes (Signed)
Date:  02/05/2023   Name:  Vanessa Zamora   DOB:  October 07, 1940   MRN:  [id number]   Chief Complaint: Diabetes, Hypertension, and Hypothyroidism  Diabetes She presents for her follow-up diabetic visit. She has type 2 diabetes mellitus. Her disease course has been stable. There are no hypoglycemic associated symptoms. Pertinent negatives for hypoglycemia include no dizziness, headaches, nervousness/anxiousness or sweats. There are no diabetic associated symptoms. Pertinent negatives for diabetes include no blurred vision, no chest pain, no fatigue and no weakness. There are no hypoglycemic complications. Symptoms are stable. Diabetic complications include heart disease. Risk factors for coronary artery disease include diabetes mellitus. Current diabetic treatment includes oral agent (monotherapy). Her weight is stable. She is following a generally healthy diet. Meal planning includes avoidance of concentrated sweets and carbohydrate counting. She participates in exercise intermittently. There is no change in her home blood glucose trend. An ACE inhibitor/angiotensin II receptor blocker is being taken.  Hypertension This is a chronic problem. The current episode started more than 1 year ago. The problem has been gradually improving since onset. The problem is controlled. Pertinent negatives include no blurred vision, chest pain, headaches, neck pain, orthopnea, palpitations, PND, shortness of breath or sweats. Past treatments include angiotensin blockers and diuretics. The current treatment provides moderate improvement. There are no compliance problems.  There is no history of angina, kidney disease, CAD/MI or heart failure. There is no history of chronic renal disease, a hypertension causing med or renovascular disease.  Hyperlipidemia She has no history of chronic renal disease. Pertinent negatives include no chest pain, myalgias or shortness of breath. Current antihyperlipidemic treatment includes  statins.    Lab Results  Component Value Date   NA 144 06/01/2022   K 4.3 06/01/2022   CO2 21 06/01/2022   GLUCOSE 76 06/01/2022   BUN 18 06/01/2022   CREATININE 0.73 06/01/2022   CALCIUM 9.6 06/01/2022   EGFR 82 06/01/2022   GFRNONAA 79 09/13/2019   Lab Results  Component Value Date   CHOL 179 01/29/2022   HDL 93 01/29/2022   LDLCALC 78 01/29/2022   TRIG 35 01/29/2022   CHOLHDL 2.2 01/26/2018   Lab Results  Component Value Date   TSH 1.840 09/13/2019   Lab Results  Component Value Date   HGBA1C 6.4 (H) 10/06/2022   Lab Results  Component Value Date   WBC 3.8 09/29/2021   HGB 11.1 09/29/2021   HCT 36.4 09/29/2021   MCV 78 (L) 09/29/2021   PLT 300 09/29/2021   Lab Results  Component Value Date   ALT 16 06/01/2022   AST 23 06/01/2022   ALKPHOS 41 (L) 06/01/2022   BILITOT 0.5 06/01/2022   No results found for: "25OHVITD2", "25OHVITD3", "VD25OH"   Review of Systems  Constitutional: Negative.  Negative for chills, fatigue, fever and unexpected weight change.  HENT:  Negative for congestion, ear discharge, ear pain, rhinorrhea, sinus pressure, sneezing and sore throat.   Eyes:  Negative for blurred vision and visual disturbance.  Respiratory:  Negative for cough, shortness of breath, wheezing and stridor.   Cardiovascular:  Negative for chest pain, palpitations, orthopnea and PND.  Gastrointestinal:  Negative for abdominal pain, blood in stool, constipation, diarrhea and nausea.  Genitourinary:  Negative for dysuria, flank pain, frequency, hematuria, urgency and vaginal discharge.  Musculoskeletal:  Negative for arthralgias, back pain, myalgias and neck pain.  Skin:  Negative for rash.  Neurological:  Negative for dizziness, weakness and headaches.  Hematological:  Negative for  adenopathy. Does not bruise/bleed easily.  Psychiatric/Behavioral:  Negative for dysphoric mood. The patient is not nervous/anxious.     Patient Active Problem List   Diagnosis Date  Noted   H/O total hysterectomy 05/22/2020   Myocardial infarction (HCC) 01/24/2018   Coronary artery disease 01/24/2018   Diabetes (HCC) 01/24/2018   Heart murmur 01/24/2018   Anemia 12/19/2015   Osteoarthritis of left glenohumeral joint 06/20/2015   Type 2 diabetes mellitus without complication, without long-term current use of insulin (HCC) 06/07/2014   Hypercholesterolemia 06/07/2014   Dyslipidemia 06/07/2014   Hypertension 06/07/2014   Thyrotoxicosis 06/07/2014   Routine general medical examination at a health care facility 06/07/2014   Screening for depression 06/07/2014    Allergies  Allergen Reactions   Ace Inhibitors Swelling   Altace  [Ramipril] Anaphylaxis   Clonidine Swelling   Losartan Potassium Other (See Comments)   Levothyroxine Rash    Past Surgical History:  Procedure Laterality Date   CARDIAC CATHETERIZATION  2005   CATARACT EXTRACTION Left 10/2014   CATARACT EXTRACTION W/PHACO Right 11/19/2014   Procedure: CATARACT EXTRACTION PHACO AND INTRAOCULAR LENS PLACEMENT (IOC);  Surgeon: Sherald Hess, MD;  Location: [facility];  Service: Ophthalmology;  Laterality: Right;  DIABETIC - oral meds   COLONOSCOPY WITH PROPOFOL N/A 06/30/2016   Procedure: COLONOSCOPY WITH PROPOFOL;  Surgeon: Christena Deem, MD;  Location: [facility];  Service: Endoscopy;  Laterality: N/A;   EYE SURGERY     VAGINAL HYSTERECTOMY  02/10/1980    Social History   Tobacco Use   Smoking status: Former    Current packs/day: 0.00    Average packs/day: 0.3 packs/day for 30.0 years (7.5 ttl pk-yrs)    Types: Cigarettes    Start date: 17    Quit date: 2003    Years since quitting: 22.0   Smokeless tobacco: Never   Tobacco comments:    quit 2003  Vaping Use   Vaping status: Never Used  Substance Use Topics   Alcohol use: No    Alcohol/week: 0.0 standard drinks of alcohol   Drug use: No     Medication list has been reviewed and updated.  Current Meds   Medication Sig   aspirin 81 MG tablet Take 1 tablet (81 mg total) by mouth daily.   Calcium Carbonate-Vit D-Min (CALTRATE 600+D PLUS MINERALS) 600-800 MG-UNIT TABS Take 1 tablet by mouth daily.   carvedilol (COREG) 6.25 MG tablet Take 1 tablet by mouth 2 (two) times daily. [place]   docusate sodium (COLACE) 100 MG capsule Take 100 mg by mouth daily as needed for mild constipation.   ferrous sulfate (FEROSUL) 325 (65 FE) MG tablet Take 1 tablet (325 mg total) by mouth every other day.   glucose blood (ONETOUCH ULTRA) test strip USE TO TEST ONCE DAILY AS DIRECTED   hydrochlorothiazide (HYDRODIURIL) 25 MG tablet TAKE 1 TABLET BY MOUTH DAILY   metFORMIN (GLUCOPHAGE) 500 MG tablet Take 1 tablet (500 mg total) by mouth 2 (two) times daily.   Multiple Vitamins-Minerals (CENTRUM SILVER ULTRA WOMENS) TABS Take 1 tablet by mouth daily.   olmesartan (BENICAR) 5 MG tablet Take 1 tablet by mouth daily   Omega-3 Fatty Acids (FISH OIL) 1000 MG CAPS Take by mouth.   pravastatin (PRAVACHOL) 40 MG tablet TAKE 1 TABLET BY MOUTH DAILY   SYNTHROID 25 MCG tablet Take 1 tablet by mouth daily. Cassandra       02/05/2023   11:14 AM 10/06/2022   10:59 AM 06/15/2022  9:34 AM 06/01/2022   10:28 AM  GAD 7 : Generalized Anxiety Score  Nervous, Anxious, on Edge 0 0 1 0  Control/stop worrying 0 0 2 0  Worry too much - different things 0 0 2 0  Trouble relaxing 0 0 0 0  Restless 0 0 0 0  Easily annoyed or irritable 0 0 0 0  Afraid - awful might happen 0 0 0 0  Total GAD 7 Score 0 0 5 0  Anxiety Difficulty Not difficult at all Not difficult at all Somewhat difficult Not difficult at all       02/05/2023   11:14 AM 10/06/2022   10:59 AM 06/15/2022    9:33 AM  Depression screen PHQ 2/9  Decreased Interest 0 0 0  Down, Depressed, Hopeless 0 0 0  PHQ - 2 Score 0 0 0  Altered sleeping 0 0 0  Tired, decreased energy 0 0 0  Change in appetite 0 0 0  Feeling bad or failure about yourself  0 0 0  Trouble  concentrating 0 0 0  Moving slowly or fidgety/restless 0 0 0  Suicidal thoughts 0 0 0  PHQ-9 Score 0 0 0  Difficult doing work/chores Not difficult at all Not difficult at all Not difficult at all    BP Readings from Last 3 Encounters:  02/05/23 126/78  11/07/22 (!) 178/82  10/06/22 128/78    Physical Exam Vitals and nursing note reviewed. Exam conducted with a chaperone present.  Constitutional:      General: She is not in acute distress.    Appearance: She is not diaphoretic.  HENT:     Head: Normocephalic and atraumatic.     Right Ear: Tympanic membrane and external ear normal.     Left Ear: Tympanic membrane and external ear normal.     Nose: Nose normal.  Eyes:     General:        Right eye: No discharge.        Left eye: No discharge.     Conjunctiva/sclera: Conjunctivae normal.     Pupils: Pupils are equal, round, and reactive to light.  Neck:     Thyroid: No thyromegaly.     Vascular: No JVD.  Cardiovascular:     Rate and Rhythm: Normal rate and regular rhythm.     Heart sounds: Normal heart sounds. No murmur heard.    No friction rub. No gallop.  Pulmonary:     Effort: Pulmonary effort is normal.     Breath sounds: Normal breath sounds. No wheezing, rhonchi or rales.  Abdominal:     General: Bowel sounds are normal.     Palpations: Abdomen is soft. There is no mass.     Tenderness: There is no abdominal tenderness. There is no guarding.  Musculoskeletal:        General: Normal range of motion.     Cervical back: Normal range of motion and neck supple.  Lymphadenopathy:     Cervical: No cervical adenopathy.  Skin:    General: Skin is warm and dry.  Neurological:     Mental Status: She is alert.     Deep Tendon Reflexes: Reflexes are normal and symmetric.     Wt Readings from Last 3 Encounters:  02/05/23 117 lb (53.1 kg)  11/07/22 115 lb 15.4 oz (52.6 kg)  10/06/22 116 lb (52.6 kg)    BP 126/78   Pulse 60   Ht 5\' 3"  (1.6 m)   Wt  117 lb (53.1 kg)    SpO2 99%   BMI 20.73 kg/m   Assessment and Plan:  1. Diabetes mellitus treated with oral medication (HCC) (Primary) Chronic.  Controlled.  Stable.  Last A1c on 10/06/22 was noted to be 6.4 which is at the edge of prediabetic range.  We will repeat A1c to see what current level of control is and patient will be maintained on current dosing of metformin 500 mg twice a day. - Hemoglobin A1c - Renal Function Panel  2. Essential (primary) hypertension Chronic.  Controlled.  Stable.  Blood pressure 126/78.  Asymptomatic.  Tolerating medication well.  We will continue on current dosings of hydrochlorothiazide 25 mg once a day, Benicar 5 mg once a day, and I believe she is also taking carvedilol 6.25 twice a day per cardiologist.  Review of last CMP noted to be normal electrolytes and normal GFR and we will repeat renal function panel for electrolytes and GFR status today. - Renal Function Panel  3. Hypercholesteremia Review of previous renal function and electrolytes review of previous lipids have been obtained almost a year ago and was noted to be normal.  We will obtain renal function panel for current level of control of cholesterol with last LDL of 79. - Lipid Panel With LDL/HDL Ratio  4. Breast cancer screening by mammogram Chronic.  Controlled.  Stable.  Patient has not palpated any breast masses in the last   Elizabeth Sauer, MD

## 2023-02-06 ENCOUNTER — Encounter: Payer: Self-pay | Admitting: Family Medicine

## 2023-02-06 LAB — LIPID PANEL WITH LDL/HDL RATIO
Cholesterol, Total: 191 mg/dL (ref 100–199)
HDL: 92 mg/dL (ref >39)
LDL Chol Calc (NIH): 91 mg/dL (ref 0–99)
LDL/HDL Ratio: 1 {ratio} (ref 0.0–3.2)
Triglycerides: 39 mg/dL (ref 0–149)
VLDL Cholesterol Cal: 8 mg/dL (ref 5–40)

## 2023-02-06 LAB — HEMOGLOBIN A1C
Est. average glucose Bld gHb Est-mCnc: 143 mg/dL
Hgb A1c MFr Bld: 6.6 % — ABNORMAL HIGH (ref 4.8–5.6)

## 2023-02-06 LAB — RENAL FUNCTION PANEL
Albumin: 4.3 g/dL (ref 3.7–4.7)
BUN/Creatinine Ratio: 26 (ref 12–28)
BUN: 18 mg/dL (ref 8–27)
CO2: 22 mmol/L (ref 20–29)
Calcium: 9.3 mg/dL (ref 8.7–10.3)
Chloride: 105 mmol/L (ref 96–106)
Creatinine, Ser: 0.7 mg/dL (ref 0.57–1.00)
Glucose: 95 mg/dL (ref 70–99)
Phosphorus: 3.2 mg/dL (ref 3.0–4.3)
Potassium: 4.5 mmol/L (ref 3.5–5.2)
Sodium: 143 mmol/L (ref 134–144)
eGFR: 85 mL/min/{1.73_m2} (ref >59)

## 2023-02-11 ENCOUNTER — Ambulatory Visit
Admission: RE | Admit: 2023-02-11 | Discharge: 2023-02-11 | Disposition: A | Discharge status: Home / Self Care | Payer: Medicare HMO | Source: Ambulatory Visit | Attending: Family Medicine | Admitting: Family Medicine

## 2023-02-11 DIAGNOSIS — M810 Age-related osteoporosis without current pathological fracture: Secondary | ICD-10-CM | POA: Insufficient documentation

## 2023-02-11 DIAGNOSIS — Z13820 Encounter for screening for osteoporosis: Secondary | ICD-10-CM | POA: Diagnosis present

## 2023-02-11 DIAGNOSIS — Z780 Asymptomatic menopausal state: Secondary | ICD-10-CM | POA: Diagnosis not present

## 2023-02-12 ENCOUNTER — Encounter: Payer: Self-pay | Admitting: Family Medicine

## 2023-02-15 ENCOUNTER — Other Ambulatory Visit: Payer: Self-pay | Admitting: Family Medicine

## 2023-02-15 DIAGNOSIS — Z1231 Encounter for screening mammogram for malignant neoplasm of breast: Secondary | ICD-10-CM

## 2023-02-23 ENCOUNTER — Other Ambulatory Visit: Payer: Self-pay | Admitting: Family Medicine

## 2023-02-23 DIAGNOSIS — E119 Type 2 diabetes mellitus without complications: Secondary | ICD-10-CM

## 2023-03-04 ENCOUNTER — Ambulatory Visit: Payer: Medicare HMO

## 2023-03-04 VITALS — BP 131/79 | Ht 65.0 in | Wt 117.0 lb

## 2023-03-04 DIAGNOSIS — Z0000 Encounter for general adult medical examination without abnormal findings: Secondary | ICD-10-CM | POA: Diagnosis not present

## 2023-03-04 NOTE — Patient Instructions (Addendum)
Vanessa Zamora , Thank you for taking time to come for your Medicare Wellness Visit. I appreciate your ongoing commitment to your health goals. Please review the following plan we discussed and let me know if I can assist you in the future.   Referrals/Orders/Follow-Ups/Clinician Recommendations: Keep up the good work!  This is a list of the screening recommended for you and due dates:  Health Maintenance  Topic Date Due   COVID-19 Vaccine (8 - 2024-25 season) 01/22/2023   Complete foot exam   02/05/2024*   Mammogram  03/12/2023   Hemoglobin A1C  08/06/2023   Yearly kidney health urinalysis for diabetes  10/06/2023   Eye exam for diabetics  12/04/2023   Yearly kidney function blood test for diabetes  02/05/2024   Medicare Annual Wellness Visit  03/03/2024   DEXA scan (bone density measurement)  02/10/2025   DTaP/Tdap/Td vaccine (3 - Td or Tdap) 01/27/2026   Pneumonia Vaccine  Completed   Flu Shot  Completed   Zoster (Shingles) Vaccine  Completed   HPV Vaccine  Aged Out  *Topic was postponed. The date shown is not the original due date.    Advanced directives: (In Chart) A copy of your advanced directives are scanned into your chart should your provider ever need it.  Next Medicare Annual Wellness Visit scheduled for next year: Yes, 03/16/24 @ 1:10pm (phone visit)

## 2023-03-04 NOTE — Progress Notes (Signed)
Subjective:   Vanessa Zamora is a 83 y.o. female who presents for Medicare Annual (Subsequent) preventive examination.  This patient declined Interactive audio and Acupuncturist. Therefore the visit was completed with audio only.   Visit Complete: Virtual I connected with  Kennieth Rad on 03/04/23 by a audio enabled telemedicine application and verified that I am speaking with the correct person using two identifiers.  Patient Location: Home  Provider Location: Home Office  I discussed the limitations of evaluation and management by telemedicine. The patient expressed understanding and agreed to proceed.  Vital Signs: Because this visit was a virtual/telehealth visit, some criteria may be missing or patient reported. Any vitals not documented were not able to be obtained and vitals that have been documented are patient reported.   Cardiac Risk Factors include: advanced age (>41men, >72 women);hypertension;diabetes mellitus;dyslipidemia;Other (see comment), Risk factor comments: CAD     Objective:    Today's Vitals   03/04/23 1344  BP: 131/79  Weight: 117 lb (53.1 kg)  Height: 5\' 5"  (1.651 m)   Body mass index is 19.47 kg/m.     03/04/2023    1:59 PM 11/07/2022    2:34 PM 02/27/2022    1:37 PM 02/19/2021    8:54 AM 02/19/2020    9:06 AM 02/15/2019    9:00 AM 01/24/2018   10:30 AM  Advanced Directives  Does Patient Have a Medical Advance Directive? Yes Yes Yes Yes Yes Yes No  Type of Estate agent of [place];Living will Healthcare Power of [place];Living will Healthcare Power of [place];Living will Healthcare Power of [place];Living will Healthcare Power of [place];Living will Healthcare Power of [place];Living will   Does patient want to make changes to medical advance directive? No - Patient declined  No - Patient declined    Yes (MAU/Ambulatory/Procedural Areas - Information given)  Copy of Healthcare Power of Attorney in Chart? Yes -  validated most recent copy scanned in chart (See row information)  Yes - validated most recent copy scanned in chart (See row information) Yes - validated most recent copy scanned in chart (See row information) Yes - validated most recent copy scanned in chart (See row information) No - copy requested     Current Medications (verified) Outpatient Encounter Medications as of 03/04/2023  Medication Sig   aspirin 81 MG tablet Take 1 tablet (81 mg total) by mouth daily.   Calcium Carbonate-Vit D-Min (CALTRATE 600+D PLUS MINERALS) 600-800 MG-UNIT TABS Take 1 tablet by mouth daily.   carvedilol (COREG) 6.25 MG tablet Take 1 tablet by mouth 2 (two) times daily. [place]   docusate sodium (COLACE) 100 MG capsule Take 100 mg by mouth daily as needed for mild constipation.   ferrous sulfate (FEROSUL) 325 (65 FE) MG tablet Take 1 tablet (325 mg total) by mouth every other day.   glucose blood (ONETOUCH ULTRA) test strip USE TO TEST ONCE DAILY AS DIRECTED   hydrochlorothiazide (HYDRODIURIL) 25 MG tablet TAKE 1 TABLET BY MOUTH DAILY   metFORMIN (GLUCOPHAGE) 500 MG tablet Take 1 tablet (500 mg total) by mouth 2 (two) times daily.   Multiple Vitamins-Minerals (CENTRUM SILVER ULTRA WOMENS) TABS Take 1 tablet by mouth daily.   olmesartan (BENICAR) 5 MG tablet TAKE 1 TABLET BY MOUTH DAILY   pravastatin (PRAVACHOL) 40 MG tablet TAKE 1 TABLET BY MOUTH DAILY   SYNTHROID 25 MCG tablet Take 1 tablet by mouth daily. [place]   Omega-3 Fatty Acids (FISH OIL) 1000 MG CAPS Take by  mouth. (Patient not taking: Reported on 03/04/2023)   No facility-administered encounter medications on file as of 03/04/2023.    Allergies (verified) Ace inhibitors, Altace  [ramipril], Clonidine, Losartan potassium, and Levothyroxine   History: Past Medical History:  Diagnosis Date   Arthritis    shoulders, knees right side   Coronary artery disease    Diabetes mellitus without complication (HCC)    Hypercholesteremia     Hypertension    Myocardial infarction (HCC) 2005   Seasonal allergies    Thyroid goiter    Wears dentures    partial lower   Past Surgical History:  Procedure Laterality Date   CARDIAC CATHETERIZATION  2005   CATARACT EXTRACTION Left 10/2014   CATARACT EXTRACTION W/PHACO Right 11/19/2014   Procedure: CATARACT EXTRACTION PHACO AND INTRAOCULAR LENS PLACEMENT (IOC);  Surgeon: Sherald Hess, MD;  Location: [facility];  Service: Ophthalmology;  Laterality: Right;  DIABETIC - oral meds   COLONOSCOPY WITH PROPOFOL N/A 06/30/2016   Procedure: COLONOSCOPY WITH PROPOFOL;  Surgeon: Christena Deem, MD;  Location: [facility];  Service: Endoscopy;  Laterality: N/A;   EYE SURGERY     VAGINAL HYSTERECTOMY  02/10/1980   Family History  Problem Relation Age of Onset   Stroke Mother    Aneurysm Sister    Heart disease Brother    Hypertension Brother    Goiter Maternal Grandmother    Diabetes Maternal Grandmother    COPD Brother    Hypertension Brother    Breast cancer Neg Hx    Social History   Socioeconomic History   Marital status: Single    Spouse name: Not on file   Number of children: 1   Years of education: Not on file   Highest education level: Not on file  Occupational History   Occupation: Retired  Tobacco Use   Smoking status: Former    Current packs/day: 0.00    Average packs/day: 0.3 packs/day for 30.0 years (7.5 ttl pk-yrs)    Types: Cigarettes    Start date: 54    Quit date: 2003    Years since quitting: 22.0   Smokeless tobacco: Never   Tobacco comments:    quit 2003  Vaping Use   Vaping status: Never Used  Substance and Sexual Activity   Alcohol use: No    Alcohol/week: 0.0 standard drinks of alcohol   Drug use: No   Sexual activity: Not Currently  Other Topics Concern   Not on file  Social History Narrative   Pt lives alone   Social Drivers of Health   Financial Resource Strain: Low Risk  (03/04/2023)   Overall Financial  Resource Strain (CARDIA)    Difficulty of Paying Living Expenses: Not hard at all  Food Insecurity: No Food Insecurity (03/04/2023)   Hunger Vital Sign    Worried About Running Out of Food in the Last Year: Never true    Ran Out of Food in the Last Year: Never true  Transportation Needs: No Transportation Needs (03/04/2023)   PRAPARE - Administrator, Civil Service (Medical): No    Lack of Transportation (Non-Medical): No  Physical Activity: Sufficiently Active (03/04/2023)   Exercise Vital Sign    Days of Exercise per Week: 5 days    Minutes of Exercise per Session: 30 min  Stress: No Stress Concern Present (03/04/2023)   [employer] of Occupational Health - Occupational Stress Questionnaire    Feeling of Stress : Not at all  Social Connections: Moderately  Integrated (03/04/2023)   Social Connection and Isolation Panel [NHANES]    Frequency of Communication with Friends and Family: More than three times a week    Frequency of Social Gatherings with Friends and Family: More than three times a week    Attends Religious Services: More than 4 times per year    Active Member of [employer] or Organizations: Yes    Attends Engineer, structural: More than 4 times per year    Marital Status: Never married    Tobacco Counseling Counseling given: Not Answered Tobacco comments: quit 2003   Clinical Intake:  Pre-visit preparation completed: Yes  Pain : No/denies pain     BMI - recorded: 19.47 Nutritional Status: BMI of 19-24  Normal Nutritional Risks: None Diabetes: Yes CBG done?: No (FBS 105 per patient) Did pt. bring in CBG monitor from home?: No  How often do you need to have someone help you when you read instructions, pamphlets, or other written materials from your doctor or pharmacy?: 1 - Never  Interpreter Needed?: No  Information entered by :: Tora Kindred, CMA   Activities of Daily Living    03/04/2023    1:47 PM  In your present state of health,  do you have any difficulty performing the following activities:  Hearing? 0  Vision? 0  Difficulty concentrating or making decisions? 0  Walking or climbing stairs? 0  Dressing or bathing? 0  Doing errands, shopping? 0  Preparing Food and eating ? N  Using the Toilet? N  In the past six months, have you accidently leaked urine? Y  Comment wears a pad prn  Do you have problems with loss of bowel control? N  Managing your Medications? N  Managing your Finances? N  Housekeeping or managing your Housekeeping? N    Patient Care Team: Duanne Limerick, MD as PCP - General (Family Medicine) Patrecia Pace, Delsa Sale, MD as Consulting Physician (Endocrinology) Alwyn Pea, MD as Consulting Physician (Cardiology) Gwyneth Revels, DPM as Referring Physician (Podiatry) [place] Crane, MD as Consulting Physician (Ophthalmology)  Indicate any recent Medical Services you may have received from other than [facility] providers in the past year (date may be approximate).     Assessment:   This is a routine wellness examination for [place].  Hearing/Vision screen Hearing Screening - Comments:: Denies hearing loss Vision Screening - Comments:: Gets eye exams, Dr. Willey Blade [facility]   Goals Addressed             This Visit's Progress    Patient Stated       Continue to walk, drink more water and eat healthy      Depression Screen    03/04/2023    1:57 PM 02/05/2023   11:14 AM 10/06/2022   10:59 AM 06/15/2022    9:33 AM 06/01/2022   10:27 AM 03/16/2022   10:33 AM 02/27/2022    1:13 PM  PHQ 2/9 Scores  PHQ - 2 Score 0 0 0 0 0 0 0  PHQ- 9 Score 0 0 0 0 0 0     Fall Risk    03/04/2023    2:02 PM 02/05/2023   11:14 AM 10/06/2022   10:58 AM 06/15/2022    9:33 AM 06/01/2022   10:27 AM  Fall Risk   Falls in the past year? 0 0 0 0 0  Number falls in past yr: 0 0 0 0 0  Injury with Fall? 0 0 0 0 0  Risk for fall due to : No Fall Risks No Fall Risks No Fall Risks No Fall Risks No Fall  Risks  Follow up Falls prevention discussed Falls evaluation completed Falls evaluation completed Falls evaluation completed Falls evaluation completed    MEDICARE RISK AT HOME: Medicare Risk at Home Any stairs in or around the home?: Yes (1 step) If so, are there any without handrails?: Yes Home free of loose throw rugs in walkways, pet beds, electrical cords, etc?: Yes Adequate lighting in your home to reduce risk of falls?: Yes Life alert?: Yes Use of a cane, walker or w/c?: No Grab bars in the bathroom?: Yes Shower chair or bench in shower?: No Elevated toilet seat or a handicapped toilet?: Yes  TIMED UP AND GO:  Was the test performed?  No    Cognitive Function:        03/04/2023    2:04 PM 02/27/2022    1:17 PM 02/19/2020    9:08 AM 02/15/2019    9:08 AM 01/24/2018   10:35 AM  6CIT Screen  What Year? 0 points 0 points 0 points 0 points 0 points  What month? 0 points 0 points 0 points 0 points 0 points  What time? 0 points 0 points 0 points 0 points 0 points  Count back from 20 0 points 0 points 0 points 0 points 0 points  Months in reverse 0 points 0 points 0 points 0 points 0 points  Repeat phrase 0 points 2 points 0 points 0 points 0 points  Total Score 0 points 2 points 0 points 0 points 0 points    Immunizations Immunization History  Administered Date(s) Administered   Fluad Quad(high Dose 65+) 11/09/2018, 11/14/2019, 11/14/2020, 11/24/2021   Fluad Trivalent(High Dose 65+) 10/06/2022   Influenza, High Dose Seasonal PF 11/24/2016, 11/12/2017   Influenza, Seasonal, Injecte, Preservative Fre 11/13/2010, 11/18/2011   Influenza,inj,Quad PF,6+ Mos 11/16/2012, 11/13/2013, 12/06/2014, 11/12/2015   Influenza-Unspecified 11/13/2013   PFIZER(Purple Top)SARS-COV-2 Vaccination 03/02/2019, 03/23/2019, 11/30/2019, 05/29/2020, 11/15/2020   PNEUMOCOCCAL CONJUGATE-20 05/28/2021   Pfizer Covid-19 Vaccine Bivalent Booster 61yrs & up 11/26/2021   Pfizer(Comirnaty)Fall Seasonal  Vaccine 12 years and older 11/27/2022   Pneumococcal Conjugate-13 12/18/2014   Pneumococcal Polysaccharide-23 05/08/2010, 05/13/2012   Td 09/26/1987   Tdap 01/28/2016   Zoster Recombinant(Shingrix) 12/10/2017, 02/17/2018   Zoster, Live 01/09/2013    TDAP status: Up to date  Flu Vaccine status: Up to date  Pneumococcal vaccine status: Up to date  Covid-19 vaccine status: Completed vaccines  Qualifies for Shingles Vaccine? Yes   Zostavax completed Yes   Shingrix Completed?: Yes  Screening Tests Health Maintenance  Topic Date Due   COVID-19 Vaccine (8 - 2024-25 season) 01/22/2023   FOOT EXAM  02/05/2024 (Originally 01/30/2023)   MAMMOGRAM  03/12/2023   HEMOGLOBIN A1C  08/06/2023   Diabetic kidney evaluation - Urine ACR  10/06/2023   OPHTHALMOLOGY EXAM  12/04/2023   Diabetic kidney evaluation - eGFR measurement  02/05/2024   Medicare Annual Wellness (AWV)  03/03/2024   DEXA SCAN  02/10/2025   DTaP/Tdap/Td (3 - Td or Tdap) 01/27/2026   Pneumonia Vaccine 93+ Years old  Completed   INFLUENZA VACCINE  Completed   Zoster Vaccines- Shingrix  Completed   HPV VACCINES  Aged Out    Health Maintenance  Health Maintenance Due  Topic Date Due   COVID-19 Vaccine (8 - 2024-25 season) 01/22/2023    Colorectal cancer screening: No longer required.   Mammogram status: Completed 03/11/22. Repeat every year.  Scheduled 03/15/23  Bone Density status: Completed 02/11/23. Results reflect: Bone density results: OSTEOPOROSIS. Repeat every 2 years.  Lung Cancer Screening: (Low Dose CT Chest recommended if Age 69-80 years, 20 pack-year currently smoking OR have quit w/in 15years.) does not qualify.   Lung Cancer Screening Referral: n/a  Additional Screening:  Hepatitis C Screening: does not qualify;  Vision Screening: Recommended annual ophthalmology exams for early detection of glaucoma and other disorders of the eye. Is the patient up to date with their annual eye exam?  Yes  Who is the  provider or what is the name of the office in which the patient attends annual eye exams? Dr. Willey Blade If pt is not established with a provider, would they like to be referred to a provider to establish care? No .   Dental Screening: Recommended annual dental exams for proper oral hygiene  Diabetic Foot Exam: Diabetic Foot Exam: Overdue, Pt has been advised about the importance in completing this exam. Pt is scheduled for diabetic foot exam on 06/07/23 at next OV.  Community Resource Referral / Chronic Care Management: CRR required this visit?  No   CCM required this visit?  No     Plan:     I have personally reviewed and noted the following in the patient's chart:   Medical and social history Use of alcohol, tobacco or illicit drugs  Current medications and supplements including opioid prescriptions. Patient is not currently taking opioid prescriptions. Functional ability and status Nutritional status Physical activity Advanced directives List of other physicians Hospitalizations, surgeries, and ER visits in previous 12 months Vitals Screenings to include cognitive, depression, and falls Referrals and appointments  In addition, I have reviewed and discussed with patient certain preventive protocols, quality metrics, and best practice recommendations. A written personalized care plan for preventive services as well as general preventive health recommendations were provided to patient.     Tora Kindred, CMA   03/04/2023   After Visit Summary: (MyChart) Due to this being a telephonic visit, the after visit summary with patients personalized plan was offered to patient via MyChart   Nurse Notes:  Declined DM education & Nutrition Needs DM foot exam at next OV on 06/07/23 Patient is in agreement to start Fosomax for osteoporosis. She states she is waiting for prescription.

## 2023-03-15 ENCOUNTER — Ambulatory Visit
Admission: RE | Admit: 2023-03-15 | Discharge: 2023-03-15 | Disposition: A | Discharge status: Home / Self Care | Payer: Medicare HMO | Source: Ambulatory Visit | Attending: Family Medicine | Admitting: Family Medicine

## 2023-03-15 DIAGNOSIS — Z1231 Encounter for screening mammogram for malignant neoplasm of breast: Secondary | ICD-10-CM | POA: Insufficient documentation

## 2023-05-22 ENCOUNTER — Other Ambulatory Visit: Payer: Self-pay | Admitting: Family Medicine

## 2023-05-22 DIAGNOSIS — I10 Essential (primary) hypertension: Secondary | ICD-10-CM

## 2023-05-24 ENCOUNTER — Other Ambulatory Visit: Payer: Self-pay

## 2023-05-24 DIAGNOSIS — E7800 Pure hypercholesterolemia, unspecified: Secondary | ICD-10-CM

## 2023-05-24 DIAGNOSIS — E785 Hyperlipidemia, unspecified: Secondary | ICD-10-CM

## 2023-05-24 MED ORDER — PRAVASTATIN SODIUM 40 MG PO TABS: ORAL_TABLET | ORAL | 0 refills | Status: DC

## 2023-06-07 ENCOUNTER — Encounter: Payer: Self-pay | Admitting: Family Medicine

## 2023-06-07 ENCOUNTER — Ambulatory Visit (INDEPENDENT_AMBULATORY_CARE_PROVIDER_SITE_OTHER): Payer: Self-pay | Admitting: Family Medicine

## 2023-06-07 VITALS — BP 140/82 | HR 68 | Resp 16 | Ht 65.0 in | Wt 115.0 lb

## 2023-06-07 DIAGNOSIS — E7800 Pure hypercholesterolemia, unspecified: Secondary | ICD-10-CM | POA: Diagnosis not present

## 2023-06-07 DIAGNOSIS — E119 Type 2 diabetes mellitus without complications: Secondary | ICD-10-CM | POA: Diagnosis not present

## 2023-06-07 DIAGNOSIS — D509 Iron deficiency anemia, unspecified: Secondary | ICD-10-CM

## 2023-06-07 DIAGNOSIS — Z7984 Long term (current) use of oral hypoglycemic drugs: Secondary | ICD-10-CM

## 2023-06-07 DIAGNOSIS — I10 Essential (primary) hypertension: Secondary | ICD-10-CM | POA: Diagnosis not present

## 2023-06-07 MED ORDER — METFORMIN HCL 500 MG PO TABS: 500.0000 mg | ORAL_TABLET | Freq: Two times a day (BID) | ORAL | 1 refills | Status: AC

## 2023-06-07 MED ORDER — FERROUS SULFATE 325 (65 FE) MG PO TABS
325.0000 mg | ORAL_TABLET | ORAL | 1 refills | Status: AC
Start: 2023-06-07 — End: ?

## 2023-06-07 MED ORDER — PRAVASTATIN SODIUM 40 MG PO TABS
ORAL_TABLET | ORAL | 1 refills | Status: AC
Start: 2023-06-07 — End: ?

## 2023-06-07 MED ORDER — HYDROCHLOROTHIAZIDE 25 MG PO TABS: 25.0000 mg | ORAL_TABLET | Freq: Every day | ORAL | 1 refills | Status: DC

## 2023-06-07 MED ORDER — OLMESARTAN MEDOXOMIL 5 MG PO TABS: 5.0000 mg | ORAL_TABLET | Freq: Every day | ORAL | 1 refills | Status: AC

## 2023-06-07 NOTE — Progress Notes (Signed)
 Date:  06/07/2023   Name:  Vanessa Zamora   DOB:  05/14/1940   MRN:  [id number]   Chief Complaint: Hypertension, Diabetes, Hyperlipidemia, and Anemia  Hypertension This is a chronic problem. The current episode started more than 1 year ago. The problem has been gradually improving (did not take meds today) since onset. The problem is controlled. Pertinent negatives include no anxiety, chest pain, orthopnea, palpitations, PND or shortness of breath. There are no associated agents to hypertension. The current treatment provides moderate improvement. There is no history of chronic renal disease.  Diabetes She presents for her follow-up diabetic visit. She has type 2 diabetes mellitus. Her disease course has been stable. There are no hypoglycemic associated symptoms. Pertinent negatives for diabetes include no chest pain, no fatigue, no foot paresthesias, no polydipsia, no polyphagia, no polyuria and no weakness. There are no hypoglycemic complications. Symptoms are stable. There are no diabetic complications. Risk factors for coronary artery disease include dyslipidemia. Current diabetic treatment includes oral agent (monotherapy).  Hyperlipidemia This is a chronic problem. The current episode started more than 1 year ago. The problem is controlled. Recent lipid tests were reviewed and are normal. She has no history of chronic renal disease, diabetes, hypothyroidism, liver disease, obesity or nephrotic syndrome. Pertinent negatives include no chest pain, focal sensory loss, focal weakness, leg pain, myalgias or shortness of breath. Current antihyperlipidemic treatment includes statins. The current treatment provides moderate improvement of lipids.  Anemia There has been no palpitations. There is no history of chronic renal disease or hypothyroidism.    Lab Results  Component Value Date   NA 143 02/05/2023   K 4.5 02/05/2023   CO2 22 02/05/2023   GLUCOSE 95 02/05/2023   BUN 18 02/05/2023    CREATININE 0.70 02/05/2023   CALCIUM 9.3 02/05/2023   EGFR 85 02/05/2023   GFRNONAA 79 09/13/2019   Lab Results  Component Value Date   CHOL 191 02/05/2023   HDL 92 02/05/2023   LDLCALC 91 02/05/2023   TRIG 39 02/05/2023   CHOLHDL 2.2 01/26/2018   Lab Results  Component Value Date   TSH 1.840 09/13/2019   Lab Results  Component Value Date   HGBA1C 6.6 (H) 02/05/2023   Lab Results  Component Value Date   WBC 3.8 09/29/2021   HGB 11.1 09/29/2021   HCT 36.4 09/29/2021   MCV 78 (L) 09/29/2021   PLT 300 09/29/2021   Lab Results  Component Value Date   ALT 16 06/01/2022   AST 23 06/01/2022   ALKPHOS 41 (L) 06/01/2022   BILITOT 0.5 06/01/2022   No results found for: "25OHVITD2", "25OHVITD3", "VD25OH"   Review of Systems  Constitutional:  Negative for fatigue and unexpected weight change.  Eyes:  Negative for photophobia.  Respiratory:  Negative for apnea, cough, chest tightness, shortness of breath and wheezing.   Cardiovascular:  Negative for chest pain, palpitations, orthopnea and PND.  Gastrointestinal:  Negative for abdominal distention.  Endocrine: Negative for polydipsia, polyphagia and polyuria.  Musculoskeletal:  Negative for myalgias.  Neurological:  Negative for focal weakness and weakness.    Patient Active Problem List   Diagnosis Date Noted   H/O total hysterectomy 05/22/2020   Myocardial infarction (HCC) 01/24/2018   Coronary artery disease 01/24/2018   Diabetes (HCC) 01/24/2018   Heart murmur 01/24/2018   Anemia 12/19/2015   Osteoarthritis of left glenohumeral joint 06/20/2015   Type 2 diabetes mellitus without complication, without long-term current use of insulin (HCC) 06/07/2014  Hypercholesterolemia 06/07/2014   Dyslipidemia 06/07/2014   Hypertension 06/07/2014   Thyrotoxicosis 06/07/2014   Routine general medical examination at a health care facility 06/07/2014   Screening for depression 06/07/2014    Allergies  Allergen Reactions    Ace Inhibitors Swelling   Altace  [Ramipril] Anaphylaxis   Clonidine Swelling   Losartan Potassium Other (See Comments)   Levothyroxine Rash    Past Surgical History:  Procedure Laterality Date   CARDIAC CATHETERIZATION  2005   CATARACT EXTRACTION Left 10/2014   CATARACT EXTRACTION W/PHACO Right 11/19/2014   Procedure: CATARACT EXTRACTION PHACO AND INTRAOCULAR LENS PLACEMENT (IOC);  Surgeon: Billee Buddle, MD;  Location[facility];  Service: Ophthalmology;  Laterality: Right;  DIABETIC - oral meds   COLONOSCOPY WITH PROPOFOL  N/A 06/30/2016   Procedure: COLONOSCOPY WITH PROPOFOL ;  Surgeon: Deveron Fly, MD;  Location: [facility];  Service: Endoscopy;  Laterality: N/A;   EYE SURGERY     VAGINAL HYSTERECTOMY  02/10/1980    Social History   Tobacco Use   Smoking status: Former    Current packs/day: 0.00    Average packs/day: 0.3 packs/day for 30.0 years (7.5 ttl pk-yrs)    Types: Cigarettes    Start date: 64    Quit date: 2003    Years since quitting: 22.3   Smokeless tobacco: Never   Tobacco comments:    quit 2003  Vaping Use   Vaping status: Never Used  Substance Use Topics   Alcohol use: No    Alcohol/week: 0.0 standard drinks of alcohol   Drug use: No     Medication list has been reviewed and updated.  Current Meds  Medication Sig   aspirin  81 MG tablet Take 1 tablet (81 mg total) by mouth daily.   Calcium Carbonate-Vit D-Min (CALTRATE 600+D PLUS MINERALS) 600-800 MG-UNIT TABS Take 1 tablet by mouth daily.   carvedilol  (COREG ) 6.25 MG tablet Take 1 tablet by mouth 2 (two) times daily. [place]   docusate sodium (COLACE) 100 MG capsule Take 100 mg by mouth daily as needed for mild constipation.   ferrous sulfate  (FEROSUL) 325 (65 FE) MG tablet Take 1 tablet (325 mg total) by mouth every other day.   glucose blood (ONETOUCH ULTRA) test strip USE TO TEST ONCE DAILY AS DIRECTED   hydrochlorothiazide  (HYDRODIURIL ) 25 MG tablet TAKE 1 TABLET  BY MOUTH DAILY   metFORMIN  (GLUCOPHAGE ) 500 MG tablet Take 1 tablet (500 mg total) by mouth 2 (two) times daily.   Multiple Vitamins-Minerals (CENTRUM SILVER ULTRA WOMENS) TABS Take 1 tablet by mouth daily.   olmesartan  (BENICAR ) 5 MG tablet TAKE 1 TABLET BY MOUTH DAILY   pravastatin  (PRAVACHOL ) 40 MG tablet Take 1 tablet by mouth daily   SYNTHROID 25 MCG tablet Take 1 tablet by mouth daily. Cassandra       02/05/2023   11:14 AM 10/06/2022   10:59 AM 06/15/2022    9:34 AM 06/01/2022   10:28 AM  GAD 7 : Generalized Anxiety Score  Nervous, Anxious, on Edge 0 0 1 0  Control/stop worrying 0 0 2 0  Worry too much - different things 0 0 2 0  Trouble relaxing 0 0 0 0  Restless 0 0 0 0  Easily annoyed or irritable 0 0 0 0  Afraid - awful might happen 0 0 0 0  Total GAD 7 Score 0 0 5 0  Anxiety Difficulty Not difficult at all Not difficult at all Somewhat difficult Not difficult at all  03/04/2023    1:57 PM 02/05/2023   11:14 AM 10/06/2022   10:59 AM  Depression screen PHQ 2/9  Decreased Interest 0 0 0  Down, Depressed, Hopeless 0 0 0  PHQ - 2 Score 0 0 0  Altered sleeping 0 0 0  Tired, decreased energy 0 0 0  Change in appetite 0 0 0  Feeling bad or failure about yourself  0 0 0  Trouble concentrating 0 0 0  Moving slowly or fidgety/restless 0 0 0  Suicidal thoughts 0 0 0  PHQ-9 Score 0 0 0  Difficult doing work/chores Not difficult at all Not difficult at all Not difficult at all    BP Readings from Last 3 Encounters:  06/07/23 (!) 140/82  03/04/23 131/79  02/05/23 126/78    Physical Exam Vitals and nursing note reviewed.  Constitutional:      General: She is not in acute distress.    Appearance: She is not diaphoretic.  HENT:     Head: Normocephalic and atraumatic.     Right Ear: Tympanic membrane, ear canal and external ear normal.     Left Ear: Tympanic membrane, ear canal and external ear normal.     Nose: Nose normal. No congestion or rhinorrhea.      Mouth/Throat:     Mouth: Mucous membranes are moist.  Eyes:     General:        Right eye: No discharge.        Left eye: No discharge.     Conjunctiva/sclera: Conjunctivae normal.     Pupils: Pupils are equal, round, and reactive to light.  Neck:     Thyroid : No thyromegaly.     Vascular: No JVD.  Cardiovascular:     Rate and Rhythm: Normal rate and regular rhythm.     Heart sounds: Normal heart sounds. No murmur heard.    No friction rub. No gallop.  Pulmonary:     Effort: Pulmonary effort is normal.     Breath sounds: Normal breath sounds. No wheezing, rhonchi or rales.  Abdominal:     General: Bowel sounds are normal.     Palpations: Abdomen is soft. There is no mass.     Tenderness: There is no abdominal tenderness. There is no guarding.  Musculoskeletal:        General: Normal range of motion.     Cervical back: Normal range of motion and neck supple.  Lymphadenopathy:     Cervical: No cervical adenopathy.  Skin:    General: Skin is warm and dry.  Neurological:     Mental Status: She is alert.     Deep Tendon Reflexes: Reflexes are normal and symmetric.    Wt Readings from Last 3 Encounters:  06/07/23 115 lb (52.2 kg)  03/04/23 117 lb (53.1 kg)  02/05/23 117 lb (53.1 kg)    BP (!) 140/82   Pulse 68   Resp 16   Ht 5\' 5"  (1.651 m)   Wt 115 lb (52.2 kg)   SpO2 100%   BMI 19.14 kg/m   Assessment and Plan: 1. Essential (primary) hypertension (Primary) Chronic.  Controlled.  Stable.  Blood pressure is 140/82.  Asymptomatic.  Tolerating medications well.  Continue hydrochlorothiazide  25 mg once a day olmesartan  5 mg once a day and Coreg  6.25 entheses provided by Dr. Beau Bound) twice a day.  Review of previous labs have been in normal range and we will repeat for her current level with a CMP today.  Will recheck patient in 4 to 6 months. - hydrochlorothiazide  (HYDRODIURIL ) 25 MG tablet; Take 1 tablet (25 mg total) by mouth daily.  Dispense: 90 tablet; Refill: 1  2.  Iron deficiency anemia, unspecified iron deficiency anemia type Chronic.  Controlled.  Stable.  Currently stable and will continue ferrous sulfate  325 mg every other day. - ferrous sulfate  (FEROSUL) 325 (65 FE) MG tablet; Take 1 tablet (325 mg total) by mouth every other day.  Dispense: 90 tablet; Refill: 1  3. Type 2 diabetes mellitus without complication, without long-term current use of insulin (HCC) Chronic.  Controlled.  Stable.  Asymptomatic.  Tolerating metformin  500 mg twice a day.  And we will continue at current dosing at this time.  A1c will be evaluated at this time along with CMP for fasting glucose.  Will recheck patient in 4 months. - metFORMIN  (GLUCOPHAGE ) 500 MG tablet; Take 1 tablet (500 mg total) by mouth 2 (two) times daily.  Dispense: 180 tablet; Refill: 1 - olmesartan  (BENICAR ) 5 MG tablet; Take 1 tablet (5 mg total) by mouth daily.  Dispense: 90 tablet; Refill: 1  4. Diabetes mellitus treated with oral medication (HCC) As noted above - metFORMIN  (GLUCOPHAGE ) 500 MG tablet; Take 1 tablet (500 mg total) by mouth 2 (two) times daily.  Dispense: 180 tablet; Refill: 1 - Hemoglobin A1c - Comprehensive metabolic panel with GFR  5. Hypercholesteremia Chronic.  Controlled.  Stable.  Asymptomatic.  Without muscle pain or muscle weakness.  Will continue pravastatin  40 mg once a day.  Will check lipid panel with current level of control. - pravastatin  (PRAVACHOL ) 40 MG tablet; Take 1 tablet by mouth daily  Dispense: 90 tablet; Refill: 1 - Lipid Panel With LDL/HDL Ratio      Alayne Allis, MD

## 2023-06-08 LAB — COMPREHENSIVE METABOLIC PANEL WITH GFR
ALT: 16 IU/L (ref 0–32)
AST: 21 IU/L (ref 0–40)
Albumin: 4.4 g/dL (ref 3.7–4.7)
Alkaline Phosphatase: 49 IU/L (ref 44–121)
BUN/Creatinine Ratio: 29 — ABNORMAL HIGH (ref 12–28)
BUN: 20 mg/dL (ref 8–27)
Bilirubin Total: 0.4 mg/dL (ref 0.0–1.2)
CO2: 21 mmol/L (ref 20–29)
Calcium: 9.5 mg/dL (ref 8.7–10.3)
Chloride: 105 mmol/L (ref 96–106)
Creatinine, Ser: 0.7 mg/dL (ref 0.57–1.00)
Globulin, Total: 2.5 g/dL (ref 1.5–4.5)
Glucose: 100 mg/dL — ABNORMAL HIGH (ref 70–99)
Potassium: 4.1 mmol/L (ref 3.5–5.2)
Sodium: 143 mmol/L (ref 134–144)
Total Protein: 6.9 g/dL (ref 6.0–8.5)
eGFR: 85 mL/min/{1.73_m2} (ref >59)

## 2023-06-08 LAB — LIPID PANEL WITH LDL/HDL RATIO
Cholesterol, Total: 206 mg/dL — ABNORMAL HIGH (ref 100–199)
HDL: 94 mg/dL (ref >39)
LDL Chol Calc (NIH): 104 mg/dL — ABNORMAL HIGH (ref 0–99)
LDL/HDL Ratio: 1.1 ratio (ref 0.0–3.2)
Triglycerides: 40 mg/dL (ref 0–149)
VLDL Cholesterol Cal: 8 mg/dL (ref 5–40)

## 2023-06-08 LAB — HEMOGLOBIN A1C
Est. average glucose Bld gHb Est-mCnc: 137 mg/dL
Hgb A1c MFr Bld: 6.4 % — ABNORMAL HIGH (ref 4.8–5.6)

## 2023-06-09 ENCOUNTER — Encounter: Payer: Self-pay | Admitting: Family Medicine

## 2023-08-30 ENCOUNTER — Other Ambulatory Visit: Payer: Self-pay

## 2023-08-30 DIAGNOSIS — I10 Essential (primary) hypertension: Secondary | ICD-10-CM

## 2023-08-30 MED ORDER — HYDROCHLOROTHIAZIDE 25 MG PO TABS: 25.0000 mg | ORAL_TABLET | Freq: Every day | ORAL | 0 refills | Status: AC

## 2023-12-02 ENCOUNTER — Other Ambulatory Visit: Payer: Self-pay | Admitting: Internal Medicine

## 2023-12-02 DIAGNOSIS — I10 Essential (primary) hypertension: Secondary | ICD-10-CM

## 2023-12-03 NOTE — Telephone Encounter (Signed)
 Requested medications are due for refill today.  yes  Requested medications are on the active medications list.  yes  Last refill. 08/30/2023 #[id number] 0 rf  Future visit scheduled.   Yes   Notes to clinic.  No pcp listed.     Requested Prescriptions  Pending Prescriptions Disp Refills   hydrochlorothiazide  (HYDRODIURIL ) 25 MG tablet [Pharmacy Med Name: HYDROCHLOROTHIAZIDE  25MG  TABLETS] 90 tablet 0    Sig: TAKE 1 TABLET(25 MG) BY MOUTH DAILY     Cardiovascular: Diuretics - Thiazide Failed - 12/03/2023  2:18 PM      Failed - Last BP in normal range    BP Readings from Last 1 Encounters:  06/07/23 (!) 140/82         Passed - Cr in normal range and within 180 days    Creatinine, Ser  Date Value Ref Range Status  06/07/2023 0.70 0.57 - 1.00 mg/dL Final         Passed - K in normal range and within 180 days    Potassium  Date Value Ref Range Status  06/07/2023 4.1 3.5 - 5.2 mmol/L Final         Passed - Na in normal range and within 180 days    Sodium  Date Value Ref Range Status  06/07/2023 143 134 - 144 mmol/L Final         Passed - Valid encounter within last 6 months    Recent Outpatient Visits           5 months ago Essential (primary) hypertension   [facility] Cathryne JAYSON, MD

## 2024-02-17 ENCOUNTER — Other Ambulatory Visit: Payer: Self-pay | Admitting: Internal Medicine

## 2024-02-17 DIAGNOSIS — Z1231 Encounter for screening mammogram for malignant neoplasm of breast: Secondary | ICD-10-CM

## 2024-03-15 ENCOUNTER — Ambulatory Visit
Admission: RE | Admit: 2024-03-15 | Discharge: 2024-03-15 | Disposition: A | Source: Ambulatory Visit | Attending: Internal Medicine | Admitting: Internal Medicine

## 2024-03-15 DIAGNOSIS — Z1231 Encounter for screening mammogram for malignant neoplasm of breast: Secondary | ICD-10-CM
